# Patient Record
Sex: Male | Born: 1985 | Race: White | Hispanic: No | Marital: Single | Smoking: Former smoker
Health system: Southern US, Community
[De-identification: ages and names within clinical notes are randomized; demographics above are authoritative.]

## PROBLEM LIST (undated history)

## (undated) ENCOUNTER — Emergency Department (HOSPITAL_BASED_OUTPATIENT_CLINIC_OR_DEPARTMENT_OTHER): Admission: EM | Payer: Medicare Other | Source: Home / Self Care

## (undated) DIAGNOSIS — F319 Bipolar disorder, unspecified: Secondary | ICD-10-CM

## (undated) DIAGNOSIS — K624 Stenosis of anus and rectum: Secondary | ICD-10-CM

## (undated) DIAGNOSIS — K5090 Crohn's disease, unspecified, without complications: Secondary | ICD-10-CM

## (undated) DIAGNOSIS — F419 Anxiety disorder, unspecified: Secondary | ICD-10-CM

## (undated) DIAGNOSIS — D649 Anemia, unspecified: Secondary | ICD-10-CM

## (undated) HISTORY — DX: Stenosis of anus and rectum: K62.4

## (undated) HISTORY — PX: SUBTOTAL COLECTOMY: SHX855

---
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
## 2009-04-03 ENCOUNTER — Emergency Department (HOSPITAL_COMMUNITY): Admission: EM | Admit: 2009-04-03 | Discharge: 2009-04-03 | Payer: Self-pay | Admitting: Emergency Medicine

## 2009-04-08 ENCOUNTER — Emergency Department (HOSPITAL_COMMUNITY): Admission: EM | Admit: 2009-04-08 | Discharge: 2009-04-09 | Payer: Self-pay | Admitting: Emergency Medicine

## 2009-04-14 ENCOUNTER — Emergency Department (HOSPITAL_COMMUNITY): Admission: EM | Admit: 2009-04-14 | Discharge: 2009-04-14 | Payer: Self-pay | Admitting: Emergency Medicine

## 2009-04-19 ENCOUNTER — Inpatient Hospital Stay (HOSPITAL_COMMUNITY): Admission: EM | Admit: 2009-04-19 | Discharge: 2009-04-22 | Payer: Self-pay | Admitting: Emergency Medicine

## 2009-05-11 ENCOUNTER — Emergency Department (HOSPITAL_COMMUNITY): Admission: EM | Admit: 2009-05-11 | Discharge: 2009-05-11 | Payer: Self-pay | Admitting: Emergency Medicine

## 2010-05-13 LAB — COMPREHENSIVE METABOLIC PANEL
ALT: 16 U/L (ref 0–53)
ALT: 28 U/L (ref 0–53)
Alkaline Phosphatase: 124 U/L — ABNORMAL HIGH (ref 39–117)
BUN: 5 mg/dL — ABNORMAL LOW (ref 6–23)
BUN: 9 mg/dL (ref 6–23)
CO2: 26 mEq/L (ref 19–32)
Calcium: 8.7 mg/dL (ref 8.4–10.5)
Calcium: 9.1 mg/dL (ref 8.4–10.5)
Creatinine, Ser: 0.81 mg/dL (ref 0.4–1.5)
GFR calc non Af Amer: >60 mL/min (ref >60)
GFR calc non Af Amer: >60 mL/min (ref >60)
Glucose, Bld: 90 mg/dL (ref 70–99)
Sodium: 136 mEq/L (ref 135–145)
Sodium: 141 mEq/L (ref 135–145)
Total Bilirubin: 2.6 mg/dL — ABNORMAL HIGH (ref 0.3–1.2)

## 2010-05-13 LAB — DIFFERENTIAL
Basophils Absolute: 0.1 10*3/uL (ref 0.0–0.1)
Basophils Relative: 1 % (ref 0–1)
Basophils Relative: 1 % (ref 0–1)
Eosinophils Absolute: 0.1 10*3/uL (ref 0.0–0.7)
Eosinophils Absolute: 0.2 10*3/uL (ref 0.0–0.7)
Eosinophils Relative: 4 % (ref 0–5)
Lymphocytes Relative: 11 % — ABNORMAL LOW (ref 12–46)
Lymphocytes Relative: 17 % (ref 12–46)
Lymphocytes Relative: 5 % — ABNORMAL LOW (ref 12–46)
Lymphs Abs: 0.4 10*3/uL — ABNORMAL LOW (ref 0.7–4.0)
Lymphs Abs: 0.8 10*3/uL (ref 0.7–4.0)
Lymphs Abs: 1.2 10*3/uL (ref 0.7–4.0)
Monocytes Absolute: 0.4 10*3/uL (ref 0.1–1.0)
Monocytes Absolute: 0.6 10*3/uL (ref 0.1–1.0)
Monocytes Relative: 9 % (ref 3–12)
Monocytes Relative: 9 % (ref 3–12)
Neutro Abs: 3 10*3/uL (ref 1.7–7.7)
Neutrophils Relative %: 69 % (ref 43–77)
Neutrophils Relative %: 70 % (ref 43–77)

## 2010-05-13 LAB — CBC
HCT: 38 % — ABNORMAL LOW (ref 39.0–52.0)
HCT: 40.7 % (ref 39.0–52.0)
Hemoglobin: 12.2 g/dL — ABNORMAL LOW (ref 13.0–17.0)
Hemoglobin: 13.6 g/dL (ref 13.0–17.0)
MCHC: 32.4 g/dL (ref 30.0–36.0)
MCHC: 33.6 g/dL (ref 30.0–36.0)
MCV: 75.1 fL — ABNORMAL LOW (ref 78.0–100.0)
MCV: 75.8 fL — ABNORMAL LOW (ref 78.0–100.0)
MCV: 76.4 fL — ABNORMAL LOW (ref 78.0–100.0)
Platelets: 217 10*3/uL (ref 150–400)
Platelets: 286 10*3/uL (ref 150–400)
RBC: 4.76 MIL/uL (ref 4.22–5.81)
RBC: 5.01 MIL/uL (ref 4.22–5.81)
RBC: 5.29 MIL/uL (ref 4.22–5.81)
RDW: 14.8 % (ref 11.5–15.5)
RDW: 15.4 % (ref 11.5–15.5)
WBC: 4.9 10*3/uL (ref 4.0–10.5)

## 2010-05-13 LAB — BASIC METABOLIC PANEL
BUN: 6 mg/dL (ref 6–23)
CO2: 23 mEq/L (ref 19–32)
Creatinine, Ser: 0.79 mg/dL (ref 0.4–1.5)
GFR calc Af Amer: >60 mL/min (ref >60)
GFR calc non Af Amer: >60 mL/min (ref >60)
Glucose, Bld: 108 mg/dL — ABNORMAL HIGH (ref 70–99)
Sodium: 138 mEq/L (ref 135–145)

## 2010-05-13 LAB — URINALYSIS, ROUTINE W REFLEX MICROSCOPIC
Glucose, UA: NEGATIVE mg/dL
Hgb urine dipstick: NEGATIVE
Ketones, ur: NEGATIVE mg/dL
Nitrite: NEGATIVE
Protein, ur: NEGATIVE mg/dL
Protein, ur: NEGATIVE mg/dL
Protein, ur: NEGATIVE mg/dL
Specific Gravity, Urine: 1.014 (ref 1.005–1.030)
Specific Gravity, Urine: 1.022 (ref 1.005–1.030)
pH: 6 (ref 5.0–8.0)
pH: 6 (ref 5.0–8.0)

## 2010-05-13 LAB — URINE CULTURE
Colony Count: NO GROWTH
Culture: NO GROWTH

## 2010-05-13 LAB — POCT I-STAT, CHEM 8
Calcium, Ion: 1.22 mmol/L (ref 1.12–1.32)
Creatinine, Ser: 0.6 mg/dL (ref 0.4–1.5)
HCT: 37 % — ABNORMAL LOW (ref 39.0–52.0)
Hemoglobin: 12.6 g/dL — ABNORMAL LOW (ref 13.0–17.0)
Potassium: 3.5 mEq/L (ref 3.5–5.1)
Sodium: 144 mEq/L (ref 135–145)

## 2010-05-14 LAB — HEMOCCULT GUIAC POC 1CARD (OFFICE): Fecal Occult Bld: POSITIVE

## 2010-05-14 LAB — CBC
Hemoglobin: 14.3 g/dL (ref 13.0–17.0)
MCHC: 33.5 g/dL (ref 30.0–36.0)
Platelets: 306 10*3/uL (ref 150–400)
RDW: 14.7 % (ref 11.5–15.5)

## 2010-05-14 LAB — DIFFERENTIAL
Eosinophils Absolute: 0.2 10*3/uL (ref 0.0–0.7)
Lymphocytes Relative: 13 % (ref 12–46)
Neutro Abs: 6.2 10*3/uL (ref 1.7–7.7)

## 2010-05-14 LAB — BASIC METABOLIC PANEL
BUN: 10 mg/dL (ref 6–23)
CO2: 22 mEq/L (ref 19–32)
Chloride: 105 mEq/L (ref 96–112)
Creatinine, Ser: 0.79 mg/dL (ref 0.4–1.5)
Glucose, Bld: 111 mg/dL — ABNORMAL HIGH (ref 70–99)

## 2010-05-17 LAB — CBC
Hemoglobin: 11 g/dL — ABNORMAL LOW (ref 13.0–17.0)
MCHC: 32.9 g/dL (ref 30.0–36.0)
MCHC: 33.8 g/dL (ref 30.0–36.0)
Platelets: 206 10*3/uL (ref 150–400)
RBC: 4.38 MIL/uL (ref 4.22–5.81)
RDW: 14.8 % (ref 11.5–15.5)
RDW: 15.1 % (ref 11.5–15.5)

## 2010-05-17 LAB — BASIC METABOLIC PANEL
CO2: 25 mEq/L (ref 19–32)
Creatinine, Ser: 0.78 mg/dL (ref 0.4–1.5)
GFR calc Af Amer: >60 mL/min (ref >60)
Sodium: 140 mEq/L (ref 135–145)

## 2010-05-17 LAB — COMPREHENSIVE METABOLIC PANEL
ALT: 11 U/L (ref 0–53)
AST: 12 U/L (ref 0–37)
Alkaline Phosphatase: 58 U/L (ref 39–117)
CO2: 23 mEq/L (ref 19–32)
Calcium: 7.7 mg/dL — ABNORMAL LOW (ref 8.4–10.5)
GFR calc Af Amer: >60 mL/min (ref >60)
Potassium: 3.8 mEq/L (ref 3.5–5.1)
Sodium: 140 mEq/L (ref 135–145)
Total Protein: 5 g/dL — ABNORMAL LOW (ref 6.0–8.3)

## 2014-10-25 ENCOUNTER — Encounter (HOSPITAL_COMMUNITY): Payer: Self-pay | Admitting: Nurse Practitioner

## 2014-10-25 ENCOUNTER — Emergency Department (HOSPITAL_COMMUNITY): Payer: Medicare Other

## 2014-10-25 ENCOUNTER — Emergency Department (HOSPITAL_COMMUNITY)
Admission: EM | Admit: 2014-10-25 | Discharge: 2014-10-25 | Disposition: A | Discharge status: Home / Self Care | Payer: Medicare Other | Source: Home / Self Care | Attending: Emergency Medicine | Admitting: Emergency Medicine

## 2014-10-25 DIAGNOSIS — Z888 Allergy status to other drugs, medicaments and biological substances status: Secondary | ICD-10-CM | POA: Diagnosis not present

## 2014-10-25 DIAGNOSIS — D500 Iron deficiency anemia secondary to blood loss (chronic): Secondary | ICD-10-CM | POA: Diagnosis not present

## 2014-10-25 DIAGNOSIS — Z7952 Long term (current) use of systemic steroids: Secondary | ICD-10-CM

## 2014-10-25 DIAGNOSIS — K633 Ulcer of intestine: Secondary | ICD-10-CM | POA: Diagnosis not present

## 2014-10-25 DIAGNOSIS — F17210 Nicotine dependence, cigarettes, uncomplicated: Secondary | ICD-10-CM | POA: Diagnosis present

## 2014-10-25 DIAGNOSIS — Z8659 Personal history of other mental and behavioral disorders: Secondary | ICD-10-CM | POA: Insufficient documentation

## 2014-10-25 DIAGNOSIS — K50111 Crohn's disease of large intestine with rectal bleeding: Secondary | ICD-10-CM | POA: Diagnosis present

## 2014-10-25 DIAGNOSIS — E876 Hypokalemia: Secondary | ICD-10-CM | POA: Diagnosis present

## 2014-10-25 DIAGNOSIS — Z8249 Family history of ischemic heart disease and other diseases of the circulatory system: Secondary | ICD-10-CM

## 2014-10-25 DIAGNOSIS — T380X5A Adverse effect of glucocorticoids and synthetic analogues, initial encounter: Secondary | ICD-10-CM | POA: Diagnosis not present

## 2014-10-25 DIAGNOSIS — E860 Dehydration: Secondary | ICD-10-CM | POA: Diagnosis present

## 2014-10-25 DIAGNOSIS — F419 Anxiety disorder, unspecified: Secondary | ICD-10-CM | POA: Diagnosis present

## 2014-10-25 DIAGNOSIS — K50811 Crohn's disease of both small and large intestine with rectal bleeding: Secondary | ICD-10-CM | POA: Diagnosis not present

## 2014-10-25 DIAGNOSIS — K624 Stenosis of anus and rectum: Secondary | ICD-10-CM | POA: Diagnosis present

## 2014-10-25 DIAGNOSIS — R739 Hyperglycemia, unspecified: Secondary | ICD-10-CM | POA: Diagnosis present

## 2014-10-25 DIAGNOSIS — K5090 Crohn's disease, unspecified, without complications: Secondary | ICD-10-CM

## 2014-10-25 DIAGNOSIS — K50919 Crohn's disease, unspecified, with unspecified complications: Secondary | ICD-10-CM

## 2014-10-25 DIAGNOSIS — F319 Bipolar disorder, unspecified: Secondary | ICD-10-CM | POA: Diagnosis not present

## 2014-10-25 DIAGNOSIS — R1030 Lower abdominal pain, unspecified: Secondary | ICD-10-CM | POA: Diagnosis present

## 2014-10-25 HISTORY — DX: Crohn's disease, unspecified, without complications: K50.90

## 2014-10-25 HISTORY — DX: Bipolar disorder, unspecified: F31.9

## 2014-10-25 HISTORY — DX: Anxiety disorder, unspecified: F41.9

## 2014-10-25 LAB — URINALYSIS, ROUTINE W REFLEX MICROSCOPIC
BILIRUBIN URINE: NEGATIVE
Glucose, UA: NEGATIVE mg/dL
Hgb urine dipstick: NEGATIVE
KETONES UR: 15 mg/dL — AB
LEUKOCYTES UA: NEGATIVE
NITRITE: NEGATIVE
PROTEIN: 30 mg/dL — AB
Specific Gravity, Urine: 1.037 — ABNORMAL HIGH (ref 1.005–1.030)
UROBILINOGEN UA: 0.2 mg/dL (ref 0.0–1.0)
pH: 6.5 (ref 5.0–8.0)

## 2014-10-25 LAB — COMPREHENSIVE METABOLIC PANEL
ALK PHOS: 60 U/L (ref 38–126)
ALT: 25 U/L (ref 17–63)
AST: 21 U/L (ref 15–41)
Albumin: 3.5 g/dL (ref 3.5–5.0)
Anion gap: 7 (ref 5–15)
BILIRUBIN TOTAL: 0.7 mg/dL (ref 0.3–1.2)
BUN: 12 mg/dL (ref 6–20)
CALCIUM: 8.8 mg/dL — AB (ref 8.9–10.3)
CHLORIDE: 111 mmol/L (ref 101–111)
CO2: 25 mmol/L (ref 22–32)
CREATININE: 1.05 mg/dL (ref 0.61–1.24)
Glucose, Bld: 97 mg/dL (ref 65–99)
Potassium: 3.1 mmol/L — ABNORMAL LOW (ref 3.5–5.1)
Sodium: 143 mmol/L (ref 135–145)
TOTAL PROTEIN: 6 g/dL — AB (ref 6.5–8.1)

## 2014-10-25 LAB — CBC
HCT: 38.4 % — ABNORMAL LOW (ref 39.0–52.0)
Hemoglobin: 11.9 g/dL — ABNORMAL LOW (ref 13.0–17.0)
MCH: 23.9 pg — ABNORMAL LOW (ref 26.0–34.0)
MCHC: 31 g/dL (ref 30.0–36.0)
MCV: 77.3 fL — ABNORMAL LOW (ref 78.0–100.0)
PLATELETS: 279 10*3/uL (ref 150–400)
RBC: 4.97 MIL/uL (ref 4.22–5.81)
RDW: 14.9 % (ref 11.5–15.5)
WBC: 12 10*3/uL — AB (ref 4.0–10.5)

## 2014-10-25 LAB — SEDIMENTATION RATE: Sed Rate: 16 mm/hr (ref 0–16)

## 2014-10-25 LAB — URINE MICROSCOPIC-ADD ON

## 2014-10-25 MED ORDER — ONDANSETRON 4 MG PO TBDP: 4.0000 mg | ORAL_TABLET | Freq: Three times a day (TID) | ORAL | Status: DC | PRN

## 2014-10-25 MED ORDER — PREDNISONE 20 MG PO TABS: 40.0000 mg | ORAL_TABLET | Freq: Two times a day (BID) | ORAL | Status: DC

## 2014-10-25 MED ORDER — IOHEXOL 300 MG/ML  SOLN
25.0000 mL | Freq: Once | INTRAMUSCULAR | Status: AC | PRN
  Administered 2014-10-25: 100 mL via ORAL

## 2014-10-25 MED ORDER — OXYCODONE-ACETAMINOPHEN 5-325 MG PO TABS: 1.0000 | ORAL_TABLET | ORAL | Status: DC | PRN

## 2014-10-25 MED ORDER — SODIUM CHLORIDE 0.9 % IV BOLUS (SEPSIS)
1000.0000 mL | Freq: Once | INTRAVENOUS | Status: AC
  Administered 2014-10-25: 1000 mL via INTRAVENOUS

## 2014-10-25 MED ORDER — ONDANSETRON HCL 4 MG/2ML IJ SOLN
4.0000 mg | Freq: Once | INTRAMUSCULAR | Status: AC
  Administered 2014-10-25: 4 mg via INTRAVENOUS
  Filled 2014-10-25: qty 2

## 2014-10-25 MED ORDER — METHYLPREDNISOLONE SODIUM SUCC 125 MG IJ SOLR
125.0000 mg | Freq: Once | INTRAMUSCULAR | Status: AC
  Administered 2014-10-25: 125 mg via INTRAVENOUS
  Filled 2014-10-25: qty 2

## 2014-10-25 MED ORDER — HYDROMORPHONE HCL 1 MG/ML IJ SOLN
0.5000 mg | Freq: Once | INTRAMUSCULAR | Status: AC
  Administered 2014-10-25: 0.5 mg via INTRAVENOUS
  Filled 2014-10-25: qty 1

## 2014-10-25 MED ORDER — HYDROMORPHONE HCL 1 MG/ML IJ SOLN
1.0000 mg | Freq: Once | INTRAMUSCULAR | Status: AC
  Administered 2014-10-25: 1 mg via INTRAVENOUS
  Filled 2014-10-25: qty 1

## 2014-10-25 NOTE — Discharge Instructions (Signed)
Please take 40mg  Prednisone twice a day for 5 days and then resume taking your usual prescription of 40mg  Prednisone daily.  Please take Zofran and Percocet as prescribed for nausea and pain control. Please follow up with your GI physician as soon as you return to [place]. Please return to the Emergency Department if symptoms worsen or new onset of fever, vomiting, increased blood in stool, urinary symptoms.

## 2014-10-25 NOTE — ED Notes (Signed)
Pt reports onset LLQ pain, nausea, diarrhea yesterday. He has a hx of chrons, is from out of state and unable to contact physician for instructions at this time. His physician just increased his prednisone dosage last week.

## 2014-10-25 NOTE — ED Provider Notes (Signed)
CSN: [id number]     Arrival date & time 10/25/14  1128 History   First MD Initiated Contact with Patient 10/25/14 1131     Chief Complaint  Patient presents with  . Abdominal Pain     (Consider location/radiation/quality/duration/timing/severity/associated sxs/prior Treatment) HPI Comments: Pt is a 29 yo male with history of Crohn's disease (last flare 1.5 months ago) who presents to the ED with complaint of LLQ pain, onset 2 days. Pt reports he is from [place] and is visiting for the week. He notes he started having LLQ pain that worsens with BM. Endorses nausea, diarrhea. He endorses small amount of blood in his stool but notes that he usually has blood in his stool and the amount of blood is typical/unchanged. He also reports increased BMs (once every hour). Denies fever, HA, cough, SOB, CP, vomiting, urinary sxs, constipation. Pt notes his GI physician increased his prednisone dose to  last last month during his routine visit. He notes at the time his imaging revealed increased inflammation. He notes he has had a decreased appetite from the nausea but has tried to eat low fiber diet since the flare started. Pt states he had abdominal surgery appx 1 year ago when he had most of his colon removed (no colostomy bag) and still has a healing mid-line abdominal incision which he notes is unchanged and without any drainage/erythema.  Patient is a 29 y.o. male presenting with abdominal pain.  Abdominal Pain Associated symptoms: diarrhea and nausea     Past Medical History  Diagnosis Date  . Anxiety   . Bipolar depression   . Crohn disease    Past Surgical History  Procedure Laterality Date  . Abdominal surgery    . Colon surgery     History reviewed. No pertinent family history. Social History  Substance Use Topics  . Smoking status: Never Smoker   . Smokeless tobacco: None  . Alcohol Use: Yes    Review of Systems  Gastrointestinal: Positive for nausea, abdominal pain, diarrhea and  blood in stool.  All other systems reviewed and are negative.     Allergies  Compazine; Lithium; and Remicade  Home Medications   Prior to Admission medications   Not on File   BP 120/82 mmHg  Pulse 115  Temp(Src) 98.7 F (37.1 C) (Oral)  Resp 18  SpO2 99% Physical Exam  Constitutional: He is oriented to person, place, and time. He appears well-developed and well-nourished.  HENT:  Head: Normocephalic and atraumatic.  Mouth/Throat: Oropharynx is clear and moist. No oropharyngeal exudate.  Eyes: Conjunctivae and EOM are normal. Pupils are equal, round, and reactive to light. Right eye exhibits no discharge. Left eye exhibits no discharge. No scleral icterus.  Neck: Normal range of motion. Neck supple.  Cardiovascular: Normal rate, regular rhythm, normal heart sounds and intact distal pulses.   Pulmonary/Chest: Effort normal and breath sounds normal. He has no wheezes. He has no rales. He exhibits no tenderness.  Abdominal: Soft. Bowel sounds are normal. He exhibits no distension and no mass. There is tenderness. There is no rigidity, no rebound and no guarding.  TTP in LLQ.   Musculoskeletal: He exhibits no edema.  Lymphadenopathy:    He has no cervical adenopathy.  Neurological: He is alert and oriented to person, place, and time.  Skin: Skin is warm and dry.  6cm healing mid-line abdominal incision, no drainage, no erythema, no warmth.   Nursing note and vitals reviewed.   ED Course  Procedures (including critical  care time) Labs Review Labs Reviewed  CBC - Abnormal; Notable for the following:    WBC 12.0 (*)    Hemoglobin 11.9 (*)    HCT 38.4 (*)    MCV 77.3 (*)    MCH 23.9 (*)    All other components within normal limits  COMPREHENSIVE METABOLIC PANEL  URINALYSIS, ROUTINE W REFLEX MICROSCOPIC (NOT AT [facility])    Imaging Review No results found. I have personally reviewed and evaluated these images and lab results as part of my medical decision-making.  Filed  Vitals:   10/25/14 1525  BP: 109/68  Pulse: 85  Temp:   Resp:    Meds given in ED:  Medications  sodium chloride 0.9 % bolus 1,000 mL (0 mLs Intravenous Stopped 10/25/14 1452)  ondansetron (ZOFRAN) injection 4 mg (4 mg Intravenous Given 10/25/14 1238)  HYDROmorphone (DILAUDID) injection 0.5 mg (0.5 mg Intravenous Given 10/25/14 1238)  iohexol (OMNIPAQUE) 300 MG/ML solution 25 mL (100 mLs Oral Contrast Given 10/25/14 1408)  HYDROmorphone (DILAUDID) injection 1 mg (1 mg Intravenous Given 10/25/14 1318)  methylPREDNISolone sodium succinate (SOLU-MEDROL) 125 mg/2 mL injection 125 mg (125 mg Intravenous Given 10/25/14 1524)    New Prescriptions   ONDANSETRON (ZOFRAN ODT) 4 MG DISINTEGRATING TABLET    Take 1 tablet (4 mg total) by mouth every 8 (eight) hours as needed for nausea or vomiting.   OXYCODONE-ACETAMINOPHEN (PERCOCET/ROXICET) 5-325 MG PER TABLET    Take 1 tablet by mouth every 4 (four) hours as needed for severe pain.   PREDNISONE (DELTASONE) 20 MG TABLET    Take 2 tablets (40 mg total) by mouth 2 (two) times daily.     MDM   Final diagnoses:  Crohn disease, unspecified complication    Pt presents with Crohn's flare with complaint of LLQ pain, nausea and diarrhea with unchanged blood in stool. No history of abscesses. VSS.   UA, CMP unremarkable. WBC slightly elevated. Sed rate 16. CT revealed with mild thickening near the ileocolonic anastomosis, and mild hypervascularity and inflammatory changes in the adjacent mesentery. Pt given 125mg  solumedrol.    Dr. Fayrene Fearing had a long discussion with patient regarding plan for admission vs. D/c. Agreed with plan to d/c pt home with increasing prednisone dose and giving rx for zofran and percocet along with following up with his GI physician in [place] once be returns home next week.   Evaluation does not show pathology requring ongoing emergent intervention or admission. Pt is hemodynamically stable and mentating appropriately. Discussed  findings/results and plan with patient/guardian, who agrees with plan. All questions answered. Return precautions discussed and outpatient follow up given.      Satira Sark Norris, [place] 10/25/14 1945  Rolland Porter, MD 10/26/14 [phone]

## 2014-10-25 NOTE — ED Provider Notes (Signed)
Pt seen and evaluated.  D/W PA Nadeau.  Long history of Crohn's disease. Had a colectomy with reanastomosis. Increased on his Zyvox was 6 weeks ago from 20-40 per day. Recent flare with increasing symptoms over last 2-3 days. No fever. Some pain. States he always has a small amount of blood in his stool has slightly more.  CT shows short segment inflammation. He was given IV antiemetics and pain medications. Was given IV stairways as well. States he is feeling better. Had a long discussion with him. Told him that if he felt he would not do well at home that this would easily justify admission to the hospital. He states that he is from out of town he has a long history of Crohn's and feels comfortable with sterilely burst and taper back to his daily dose. He states he is staying close by and could return if anything becomes worse at home.  Rolland Porter, MD 10/25/14 [phone]

## 2014-10-25 NOTE — ED Notes (Signed)
Pt stable, ambulatory, states understanding of discharge instructions 

## 2014-10-26 ENCOUNTER — Inpatient Hospital Stay (HOSPITAL_COMMUNITY)
Admission: EM | Admit: 2014-10-26 | Discharge: 2014-11-02 | DRG: 386 | Disposition: A | Discharge status: Home / Self Care | Payer: Medicare Other | Attending: Internal Medicine | Admitting: Internal Medicine

## 2014-10-26 ENCOUNTER — Encounter (HOSPITAL_COMMUNITY): Payer: Self-pay | Admitting: Emergency Medicine

## 2014-10-26 DIAGNOSIS — F17210 Nicotine dependence, cigarettes, uncomplicated: Secondary | ICD-10-CM | POA: Diagnosis not present

## 2014-10-26 DIAGNOSIS — D509 Iron deficiency anemia, unspecified: Secondary | ICD-10-CM | POA: Diagnosis present

## 2014-10-26 DIAGNOSIS — E860 Dehydration: Secondary | ICD-10-CM | POA: Diagnosis present

## 2014-10-26 DIAGNOSIS — K50919 Crohn's disease, unspecified, with unspecified complications: Secondary | ICD-10-CM | POA: Diagnosis not present

## 2014-10-26 DIAGNOSIS — K50111 Crohn's disease of large intestine with rectal bleeding: Secondary | ICD-10-CM | POA: Diagnosis not present

## 2014-10-26 DIAGNOSIS — K5010 Crohn's disease of large intestine without complications: Secondary | ICD-10-CM | POA: Diagnosis present

## 2014-10-26 DIAGNOSIS — K633 Ulcer of intestine: Secondary | ICD-10-CM | POA: Diagnosis not present

## 2014-10-26 DIAGNOSIS — E876 Hypokalemia: Secondary | ICD-10-CM | POA: Diagnosis present

## 2014-10-26 DIAGNOSIS — T380X5A Adverse effect of glucocorticoids and synthetic analogues, initial encounter: Secondary | ICD-10-CM | POA: Diagnosis not present

## 2014-10-26 DIAGNOSIS — K624 Stenosis of anus and rectum: Secondary | ICD-10-CM | POA: Diagnosis not present

## 2014-10-26 DIAGNOSIS — Z7952 Long term (current) use of systemic steroids: Secondary | ICD-10-CM | POA: Diagnosis not present

## 2014-10-26 DIAGNOSIS — R112 Nausea with vomiting, unspecified: Secondary | ICD-10-CM | POA: Diagnosis present

## 2014-10-26 DIAGNOSIS — F319 Bipolar disorder, unspecified: Secondary | ICD-10-CM | POA: Diagnosis present

## 2014-10-26 DIAGNOSIS — K50811 Crohn's disease of both small and large intestine with rectal bleeding: Principal | ICD-10-CM

## 2014-10-26 DIAGNOSIS — R739 Hyperglycemia, unspecified: Secondary | ICD-10-CM | POA: Diagnosis present

## 2014-10-26 DIAGNOSIS — R1030 Lower abdominal pain, unspecified: Secondary | ICD-10-CM | POA: Diagnosis present

## 2014-10-26 DIAGNOSIS — F419 Anxiety disorder, unspecified: Secondary | ICD-10-CM | POA: Diagnosis not present

## 2014-10-26 DIAGNOSIS — Z8249 Family history of ischemic heart disease and other diseases of the circulatory system: Secondary | ICD-10-CM | POA: Diagnosis not present

## 2014-10-26 DIAGNOSIS — K5090 Crohn's disease, unspecified, without complications: Secondary | ICD-10-CM | POA: Diagnosis present

## 2014-10-26 DIAGNOSIS — K50119 Crohn's disease of large intestine with unspecified complications: Secondary | ICD-10-CM

## 2014-10-26 DIAGNOSIS — D500 Iron deficiency anemia secondary to blood loss (chronic): Secondary | ICD-10-CM | POA: Diagnosis not present

## 2014-10-26 DIAGNOSIS — Z888 Allergy status to other drugs, medicaments and biological substances status: Secondary | ICD-10-CM | POA: Diagnosis not present

## 2014-10-26 HISTORY — DX: Anemia, unspecified: D64.9

## 2014-10-26 LAB — BASIC METABOLIC PANEL
Anion gap: 13 (ref 5–15)
BUN: 13 mg/dL (ref 6–20)
CO2: 21 mmol/L — AB (ref 22–32)
Calcium: 9.2 mg/dL (ref 8.9–10.3)
Chloride: 110 mmol/L (ref 101–111)
Creatinine, Ser: 0.93 mg/dL (ref 0.61–1.24)
GFR calc Af Amer: >60 mL/min (ref >60)
GLUCOSE: 122 mg/dL — AB (ref 65–99)
POTASSIUM: 3.1 mmol/L — AB (ref 3.5–5.1)
Sodium: 144 mmol/L (ref 135–145)

## 2014-10-26 LAB — C DIFFICILE QUICK SCREEN W PCR REFLEX
C DIFFICILE (CDIFF) INTERP: NEGATIVE
C DIFFICILE (CDIFF) TOXIN: NEGATIVE
C DIFFICLE (CDIFF) ANTIGEN: NEGATIVE

## 2014-10-26 LAB — I-STAT CG4 LACTIC ACID, ED: Lactic Acid, Venous: 4.6 mmol/L (ref 0.5–2.0)

## 2014-10-26 LAB — CBC WITH DIFFERENTIAL/PLATELET
Basophils Absolute: 0 10*3/uL (ref 0.0–0.1)
Basophils Relative: 0 % (ref 0–1)
EOS PCT: 0 % (ref 0–5)
Eosinophils Absolute: 0 10*3/uL (ref 0.0–0.7)
HCT: 35.5 % — ABNORMAL LOW (ref 39.0–52.0)
Hemoglobin: 11 g/dL — ABNORMAL LOW (ref 13.0–17.0)
LYMPHS ABS: 0.7 10*3/uL (ref 0.7–4.0)
LYMPHS PCT: 4 % — AB (ref 12–46)
MCH: 24.1 pg — AB (ref 26.0–34.0)
MCHC: 31 g/dL (ref 30.0–36.0)
MCV: 77.7 fL — AB (ref 78.0–100.0)
MONO ABS: 0.4 10*3/uL (ref 0.1–1.0)
Monocytes Relative: 3 % (ref 3–12)
Neutro Abs: 15 10*3/uL — ABNORMAL HIGH (ref 1.7–7.7)
Neutrophils Relative %: 93 % — ABNORMAL HIGH (ref 43–77)
PLATELETS: 253 10*3/uL (ref 150–400)
RBC: 4.57 MIL/uL (ref 4.22–5.81)
RDW: 14.7 % (ref 11.5–15.5)
WBC: 16.1 10*3/uL — ABNORMAL HIGH (ref 4.0–10.5)

## 2014-10-26 LAB — LACTIC ACID, PLASMA: Lactic Acid, Venous: 2.1 mmol/L (ref 0.5–2.0)

## 2014-10-26 MED ORDER — METRONIDAZOLE IN NACL 5-0.79 MG/ML-% IV SOLN
500.0000 mg | Freq: Three times a day (TID) | INTRAVENOUS | Status: DC
  Administered 2014-10-26 – 2014-11-02 (×21): 500 mg via INTRAVENOUS
  Filled 2014-10-26 (×23): qty 100

## 2014-10-26 MED ORDER — PROMETHAZINE HCL 25 MG/ML IJ SOLN
25.0000 mg | Freq: Four times a day (QID) | INTRAMUSCULAR | Status: DC | PRN
  Administered 2014-10-31: 25 mg via INTRAVENOUS
  Filled 2014-10-26: qty 1

## 2014-10-26 MED ORDER — POTASSIUM CHLORIDE 10 MEQ/100ML IV SOLN
10.0000 meq | INTRAVENOUS | Status: DC
  Administered 2014-10-26: 10 meq via INTRAVENOUS
  Filled 2014-10-26 (×3): qty 100

## 2014-10-26 MED ORDER — METHYLPREDNISOLONE SODIUM SUCC 40 MG IJ SOLR
40.0000 mg | Freq: Three times a day (TID) | INTRAMUSCULAR | Status: DC
  Administered 2014-10-26 – 2014-10-27 (×2): 40 mg via INTRAVENOUS
  Filled 2014-10-26 (×2): qty 1

## 2014-10-26 MED ORDER — SODIUM CHLORIDE 0.9 % IV BOLUS (SEPSIS)
500.0000 mL | Freq: Once | INTRAVENOUS | Status: AC
  Administered 2014-10-26: 500 mL via INTRAVENOUS

## 2014-10-26 MED ORDER — CIPROFLOXACIN IN D5W 400 MG/200ML IV SOLN: 400.0000 mg | Freq: Two times a day (BID) | INTRAVENOUS | Status: DC

## 2014-10-26 MED ORDER — LOPERAMIDE HCL 1 MG/5ML PO LIQD
1.0000 mg | ORAL | Status: DC | PRN
  Administered 2014-11-01: 1 mg via ORAL
  Filled 2014-10-26 (×3): qty 5

## 2014-10-26 MED ORDER — SODIUM CHLORIDE 0.9 % IV BOLUS (SEPSIS)
2000.0000 mL | Freq: Once | INTRAVENOUS | Status: AC
  Administered 2014-10-26: 2000 mL via INTRAVENOUS

## 2014-10-26 MED ORDER — ONDANSETRON HCL 4 MG/2ML IJ SOLN: 4.0000 mg | Freq: Four times a day (QID) | INTRAMUSCULAR | Status: DC | PRN

## 2014-10-26 MED ORDER — SODIUM CHLORIDE 0.9 % IV SOLN: Freq: Once | INTRAVENOUS | Status: DC

## 2014-10-26 MED ORDER — KETOROLAC TROMETHAMINE 15 MG/ML IJ SOLN
15.0000 mg | Freq: Four times a day (QID) | INTRAMUSCULAR | Status: DC
  Administered 2014-10-26 – 2014-10-27 (×3): 15 mg via INTRAVENOUS
  Filled 2014-10-26 (×3): qty 1

## 2014-10-26 MED ORDER — ONDANSETRON HCL 4 MG/2ML IJ SOLN
4.0000 mg | Freq: Once | INTRAMUSCULAR | Status: AC
  Administered 2014-10-26: 4 mg via INTRAVENOUS
  Filled 2014-10-26: qty 2

## 2014-10-26 MED ORDER — FAMOTIDINE IN NACL 20-0.9 MG/50ML-% IV SOLN
20.0000 mg | Freq: Two times a day (BID) | INTRAVENOUS | Status: DC
  Administered 2014-10-26 – 2014-10-27 (×2): 20 mg via INTRAVENOUS
  Filled 2014-10-26 (×3): qty 50

## 2014-10-26 MED ORDER — METHYLPREDNISOLONE SODIUM SUCC 40 MG IJ SOLR: 40.0000 mg | Freq: Three times a day (TID) | INTRAMUSCULAR | Status: DC

## 2014-10-26 MED ORDER — NICOTINE 21 MG/24HR TD PT24
21.0000 mg | MEDICATED_PATCH | TRANSDERMAL | Status: DC
  Administered 2014-10-26 – 2014-11-01 (×7): 21 mg via TRANSDERMAL
  Filled 2014-10-26 (×7): qty 1

## 2014-10-26 MED ORDER — AZATHIOPRINE 50 MG PO TABS
175.0000 mg | ORAL_TABLET | Freq: Every day | ORAL | Status: DC
  Administered 2014-10-26 – 2014-11-02 (×8): 175 mg via ORAL
  Filled 2014-10-26 (×9): qty 4

## 2014-10-26 MED ORDER — METHYLPREDNISOLONE SODIUM SUCC 40 MG IJ SOLR
40.0000 mg | Freq: Once | INTRAMUSCULAR | Status: AC
  Administered 2014-10-26: 40 mg via INTRAVENOUS
  Filled 2014-10-26: qty 1

## 2014-10-26 MED ORDER — HYDROMORPHONE HCL 1 MG/ML IJ SOLN
1.0000 mg | INTRAMUSCULAR | Status: DC | PRN
  Administered 2014-10-26 – 2014-10-27 (×6): 2 mg via INTRAVENOUS
  Administered 2014-10-27: 1 mg via INTRAVENOUS
  Administered 2014-10-27 (×2): 2 mg via INTRAVENOUS
  Administered 2014-10-28: 1 mg via INTRAVENOUS
  Administered 2014-10-28 – 2014-10-29 (×11): 2 mg via INTRAVENOUS
  Administered 2014-10-29: 1 mg via INTRAVENOUS
  Administered 2014-10-29 – 2014-10-30 (×3): 2 mg via INTRAVENOUS
  Administered 2014-10-30: 1 mg via INTRAVENOUS
  Administered 2014-10-30 – 2014-11-01 (×15): 2 mg via INTRAVENOUS
  Filled 2014-10-26 (×18): qty 2
  Filled 2014-10-26: qty 1
  Filled 2014-10-26 (×6): qty 2
  Filled 2014-10-26: qty 1
  Filled 2014-10-26 (×11): qty 2
  Filled 2014-10-26: qty 1
  Filled 2014-10-26 (×3): qty 2

## 2014-10-26 MED ORDER — GABAPENTIN 300 MG PO CAPS
900.0000 mg | ORAL_CAPSULE | Freq: Three times a day (TID) | ORAL | Status: DC
  Administered 2014-10-26 – 2014-11-02 (×20): 900 mg via ORAL
  Filled 2014-10-26 (×20): qty 3

## 2014-10-26 MED ORDER — HYDROMORPHONE HCL 1 MG/ML IJ SOLN
1.0000 mg | INTRAMUSCULAR | Status: AC | PRN
  Administered 2014-10-26 (×2): 1 mg via INTRAVENOUS
  Filled 2014-10-26 (×2): qty 1

## 2014-10-26 MED ORDER — POTASSIUM CHLORIDE IN NACL 20-0.9 MEQ/L-% IV SOLN
INTRAVENOUS | Status: DC
  Administered 2014-10-26 – 2014-10-28 (×5): via INTRAVENOUS
  Administered 2014-10-29: 75 mL/h via INTRAVENOUS
  Filled 2014-10-26 (×7): qty 1000

## 2014-10-26 MED ORDER — ENOXAPARIN SODIUM 40 MG/0.4ML ~~LOC~~ SOLN
40.0000 mg | SUBCUTANEOUS | Status: DC
  Filled 2014-10-26 (×3): qty 0.4

## 2014-10-26 MED ORDER — ALPRAZOLAM 0.5 MG PO TABS
0.5000 mg | ORAL_TABLET | Freq: Two times a day (BID) | ORAL | Status: DC
  Administered 2014-10-26 – 2014-11-02 (×15): 0.5 mg via ORAL
  Filled 2014-10-26 (×15): qty 1

## 2014-10-26 MED ORDER — HYDROMORPHONE HCL 1 MG/ML IJ SOLN
1.0000 mg | INTRAMUSCULAR | Status: DC | PRN
  Administered 2014-10-26 (×3): 1 mg via INTRAVENOUS
  Filled 2014-10-26 (×3): qty 1

## 2014-10-26 NOTE — Progress Notes (Signed)
CRITICAL VALUE ALERT  Critical value received:  Lactic acid 2.1  Date of notification:  10/26/14  Time of notification:  1354  Critical value read back:Yes.    Nurse who received alert:  Tonna Corner, RN   MD notified (1st page):  Dr. Vanessa Barbara  Time of first page:  1354  MD notified (2nd page): Dr. Vanessa Barbara  Time of second page: 1305  Responding MD:  Dr. Vanessa Barbara  Time MD responded:  [phone]

## 2014-10-26 NOTE — H&P (Signed)
[facility] History and Physical                                                                                    Andre [name], is a 29 y.o. male  MRN: [id number]   DOB - October 05, 1985  Admit Date - 10/26/2014  Outpatient Primary MD for the patient is No primary care provider on file.  Referring MD: Fayrene Fearing / ER  Consulting M.D: Rhea Belton / Gastroenterology  With History of -  Past Medical History  Diagnosis Date  . Anxiety   . Bipolar depression   . Crohn disease 1997  . Anemia     Around 2013 required PRBC transfusions. Has received parenteral iron in past. Has taken oral iron in past.      Past Surgical History  Procedure Laterality Date  . Subtotal colectomy      Has undergone a total of 3 separate bowel resections including terminal ileal resection and the equivalent of subtotal colectomy. Last surgery was in 2010.    in for   Chief Complaint  Patient presents with  . Abdominal Pain  . Nausea     HPI This is a 29 year old male patient with long-standing Crohn's disease since the age of 29 he was also undergone multiple colonic and small bowel procedures resulting with an endpoint of basically a subtotal colectomy who presents to the ER for the second time in the past 36 hours with symptoms consistent with a Crohn's flare. He also has a past medical history of bipolar disorder and anxiety on chronic medications. He has multiple drug allergies including to Remicade and and Humira. Patient has traveled to [facility] for the holiday weekend to visit a friend. He reports this past Thursday that he began having vague symptoms similar to prior Crohn's flares and by Friday morning had a severe flare. He was seen in the ER Saturday 9/3 with increased frequency of stools and increasing left lower quadrant abdominal pain with nausea. CT that time revealed trace free fluid around his ileo-colonic anastomosis well as a few loops of dilated small bowel. His symptoms improved after  anti-medics and pain medication. He was offered admission but felt as if he could manage with outpatient oral antibiotics he was given a dose of IV site Medrol in the ER and discharged from the ER with prednisone 40 mg twice a day. He returns to the ER today for worsening abdominal pain nausea and vomiting and diarrhea. He again reports averaging one stool in our which at baseline he typically only has 3 stools per day. He always has a trace amount of blood in his stools he describes as "spotting". He thinks the pain medication may be contributing decided his nausea. Chronically patient has not experienced any weight loss. His physicians are in the [facility] area. For most flares he typically responds to IV steroids with appropriate antibiotic therapy; he reports typically responds well to Cipro and Flagyl.  In the ER patient had a temperature of 98.9, BP was 106/93, pulse was 72 and regular respirations 16 with room air saturations are 100%. Laboratory data revealed persistent hypokalemia with potassium 3.1, CO2 slightly decreased at 21,  anion gap normal at 13, glucose slightly elevated at 122, renal function normal, white count had increased to 16,100, hemoglobin 11, MCV 77.1 with normal platelets, neutrophils 93%. Lactic acid was elevated at 4.6.   Review of Systems   In addition to the HPI above,  No Fever-chills, myalgias or other constitutional symptoms No Headache, changes with Vision or hearing, new weakness, tingling, numbness in any extremity, No problems swallowing food or Liquids, indigestion/reflux No Chest pain, Cough or Shortness of Breath, palpitations, orthopnea or DOE No dysuria, hematuria or flank pain No new skin rashes, lesions, masses or bruises, No new joints pains-aches No recent weight gain or loss No polyuria, polydypsia or polyphagia,  *A full 10 point Review of Systems was done, except as stated above, all other Review of Systems were negative.  Social  History Social History  Substance Use Topics  . Smoking status: Never Smoker   . Smokeless tobacco: Not on file  . Alcohol Use: Yes    Resides at: Private residence and [facility] area  Lives with: Girlfriend  Ambulatory status: Without assistive devices  Other pertinent social history: Disabled   Family History Father: Deceased-known hypertension and coronary artery disease   Prior to Admission medications   Medication Sig Start Date End Date Taking? Authorizing Provider  ALPRAZolam Prudy Feeler) 0.5 MG tablet Take 0.5 mg by mouth 2 (two) times daily.   Yes Historical Provider, MD  azaTHIOprine (IMURAN) 50 MG tablet Take 175 mg by mouth daily.   Yes Historical Provider, MD  gabapentin (NEURONTIN) 300 MG capsule Take 900 mg by mouth 3 (three) times daily.   Yes Historical Provider, MD  ondansetron (ZOFRAN ODT) 4 MG disintegrating tablet Take 1 tablet (4 mg total) by mouth every 8 (eight) hours as needed for nausea or vomiting. 10/25/14  Yes Barrett Henle, PA-C  oxyCODONE-acetaminophen (PERCOCET/ROXICET) 5-325 MG per tablet Take 1 tablet by mouth every 4 (four) hours as needed for severe pain. 10/25/14  Yes Barrett Henle, PA-C  predniSONE (DELTASONE) 20 MG tablet Take 2 tablets (40 mg total) by mouth 2 (two) times daily. 10/25/14  Yes Barrett Henle, PA-C    Allergies  Allergen Reactions  . Compazine [Prochlorperazine]   . Lithium   . Remicade [Infliximab]     Physical Exam  Vitals  Blood pressure 126/79, pulse 66, temperature 98.9 F (37.2 C), temperature source Oral, resp. rate 11, SpO2 100 %.   General:  In no acute distress, appears chronically ill  Psych:  Normal affect, Denies Suicidal or Homicidal ideations, Awake Alert, Oriented X 3. Speech and thought patterns are clear and appropriate, no apparent short term memory deficits  Neuro:   No focal neurological deficits, CN II through XII intact, Strength 5/5 all 4 extremities, Sensation  intact all 4 extremities.  ENT:  Ears and Eyes appear Normal, Conjunctivae clear, sclerae slightly yellow discolored, PER. Dry oral mucosa without erythema or exudates. Subtle moon face ease  Neck:  Supple, No lymphadenopathy appreciated  Respiratory:  Symmetrical chest wall movement, Good air movement bilaterally, CTAB. Room Air  Cardiac:  RRR, No Murmurs, no LE edema noted, no JVD, No carotid bruits, peripheral pulses palpable at 2+  Abdomen:  Positive bowel hypoactive bowel sounds, Soft, exquisitely tender left lower quadrant with guarding but no rebounding Non distended,  No masses appreciated, no obvious hepatosplenomegaly  Skin:  No Cyanosis, Normal Skin Turgor, No Skin Rash or Bruise.  Extremities: Symmetrical without obvious trauma or injury,  no effusions.  Data Review  CBC  Recent Labs Lab 10/25/14 1154 10/26/14 1032  WBC 12.0* 16.1*  HGB 11.9* 11.0*  HCT 38.4* 35.5*  PLT 279 253  MCV 77.3* 77.7*  MCH 23.9* 24.1*  MCHC 31.0 31.0  RDW 14.9 14.7  LYMPHSABS  --  0.7  MONOABS  --  0.4  EOSABS  --  0.0  BASOSABS  --  0.0    Chemistries   Recent Labs Lab 10/25/14 1154 10/26/14 1032  NA 143 144  K 3.1* 3.1*  CL 111 110  CO2 25 21*  GLUCOSE 97 122*  BUN 12 13  CREATININE 1.05 0.93  CALCIUM 8.8* 9.2  AST 21  --   ALT 25  --   ALKPHOS 60  --   BILITOT 0.7  --     CrCl cannot be calculated (Unknown ideal weight.).  No results for input(s): TSH, T4TOTAL, T3FREE, THYROIDAB in the last 72 hours.  Invalid input(s): FREET3  Coagulation profile No results for input(s): INR, PROTIME in the last 168 hours.  No results for input(s): DDIMER in the last 72 hours.  Cardiac Enzymes No results for input(s): CKMB, TROPONINI, MYOGLOBIN in the last 168 hours.  Invalid input(s): CK  Invalid input(s): POCBNP  Urinalysis    Component Value Date/Time   COLORURINE YELLOW 10/25/2014 1303   APPEARANCEUR CLOUDY* 10/25/2014 1303   LABSPEC 1.037* 10/25/2014 1303     PHURINE 6.5 10/25/2014 1303   GLUCOSEU NEGATIVE 10/25/2014 1303   HGBUR NEGATIVE 10/25/2014 1303   BILIRUBINUR NEGATIVE 10/25/2014 1303   KETONESUR 15* 10/25/2014 1303   PROTEINUR 30* 10/25/2014 1303   UROBILINOGEN 0.2 10/25/2014 1303   NITRITE NEGATIVE 10/25/2014 1303   LEUKOCYTESUR NEGATIVE 10/25/2014 1303    Imaging results:   Ct Abdomen Pelvis W Contrast  10/25/2014   CLINICAL DATA:  29 year old male with left lower quadrant pain, diarrhea and nausea for the past 24 hours. History of Crohn's disease.  EXAM: CT ABDOMEN AND PELVIS WITH CONTRAST  TECHNIQUE: Multidetector CT imaging of the abdomen and pelvis was performed using the standard protocol following bolus administration of intravenous contrast.  CONTRAST:  OMNIPAQUE IOHEXOL 300 MG/ML  SOLN  COMPARISON:  CT the abdomen and pelvis 04/09/2009.  FINDINGS: Lower chest: Innumerable tiny peribronchovascular micronodules in the right lower lobe. The largest single nodule measures 5 mm in the medial right lower lobe (image 6 of series 3).  Hepatobiliary: No cystic or solid hepatic lesions. No intra or extrahepatic biliary ductal dilatation. Gallbladder is normal in appearance.  Pancreas: No pancreatic mass. No pancreatic ductal dilatation. No pancreatic or peripancreatic fluid or inflammatory changes.  Spleen: Spleen is mildly enlarged measuring 13.3 x 4.9 x 11.3 cm (estimated splenic volume of 368 mL).  Adrenals/Urinary Tract: Bilateral adrenal glands and bilateral kidneys are normal in appearance. No hydroureteronephrosis. Urinary bladder is normal in appearance.  Stomach/Bowel: The appearance of the stomach is normal. Status post subtotal colectomy and partial small bowel resection. There is some soft tissue thickening near the ileocolic anastomosis (colonic side). Several dilated loops of distal small bowel are noted, measuring up to 5.2 cm in diameter shortly before the anastomosis, however, no definite signs to suggest bowel obstruction  are noted at this time. There is some hypervascularity and haziness in the ileocolic mesentery adjacent to the anastomosis, and there is a small amount of adjacent free fluid, suggesting some active inflammation. There several more proximal loops of small bowel which appear narrowed and thick walled, most notably in the  proximal jejunum (image 37 of series 2). Other thickened loops of small bowel in the right side of the abdomen are favored to be related to complete decompression, as there are no surrounding inflammatory changes adjacent to these loops.  Vascular/Lymphatic: No significant atherosclerotic disease, aneurysm or dissection identified in the abdominal or pelvic vasculature. No lymphadenopathy noted in the abdomen or pelvis.  Reproductive: Prostate gland and seminal vesicles are unremarkable in appearance.  Other: No significant volume of ascites.  No pneumoperitoneum.  Musculoskeletal: There are no aggressive appearing lytic or blastic lesions noted in the visualized portions of the skeleton.  IMPRESSION: 1. Postoperative changes of subtotal colectomy and partial small bowel resection, as above, with mild thickening near the ileocolonic anastomosis, and mild hypervascularity and inflammatory changes in the adjacent mesentery, suggesting some activity in this patient with history of Crohn's disease. 2. Mild splenomegaly. 3. Multiple tiny peribronchovascular micronodules in the right lower lobe, new compared to remote prior study 04/10/2011. These are nonspecific, but favored to be benign, potentially sequela of prior episodes of aspiration. 4. Additional incidental findings, as above.   Electronically Signed   By: Trudie Reed M.D.   On: 10/25/2014 14:45     Assessment & Plan  Principal Problem:   Crohn's colitis -Admit to medical floor -Gastroenterology assisting with management -GI recommends bowel rest and only allowing clears, IV steroids and symptom management -Empiric antibiotics;  patient typically takes Cipro and Flagyl-have discussed with gastroenterology physician assistant and will start with Flagyl which has anti-inflammatory properties as well -CT scan on 9/3 showed several dilated small bowel loops before the anastomosis without definite signs of bowel obstruction-current abdomen although tender is soft so do not suspect underlying ileus or bowel obstruction at this juncture -We'll attempt to continue Imuran with sips of liquids -IV Dilaudid when necessary with scheduled Toradol/Pepcid for pain management and anti-inflammatory properties  -GI also recommends checking stool for C. difficile as this is a routine standard of care  Active Problems:   Dehydration, moderate -Intractable nausea and vomiting -Replace volume loss with IV fluids    Acute hypokalemia -Secondary to GI losses -IV potassium -Follow electrolytes    Refractory nausea and vomiting -Multifactorial: Secondary to Crohn's flare as well as intolerance to oral pain medications -Bowel rest at least for the first 24 hours recommended by GI    Acute hyperglycemia -Likely related to steroids -Less than 200; if repeat glucose elevated in a.m. consider following CBGs every 4 hours    Microcytic anemia -Chronic and has received IV iron in the past -Baseline hemoglobin unknown but currently appears stable around 11 but follow-up CBC after hydration    Bipolar affective with anxiety disorder -We'll continue preadmission Neurontin and Xanax with sip of water as tolerated to prevent breakthrough episodes    DVT Prophylaxis: Lovenox  Family Communication:   No family at bedside  Code Status:  Full code  Condition:  Stable  Discharge disposition: Anticipate discharge within next 36-72 hours pending improvement in Crohn's flare symptoms and ability to tolerate diet and other oral medication  Time spent in minutes : 60      ELLIS,ALLISON L. ANP on 10/26/2014 at 1:11 PM  Between 7am to 7pm -  Pager - [phone]  After 7pm go to [email or web address] - password [email or web address]  And look for the night coverage person covering me after hours  [facility]  Addendum  I personally evaluated patient on 10/26/2014 and agree with above findings. Andre [name] is a 29 year old woman  with a history of Crohn's disease diagnosed at the age of 29 years old reporting having multiple hospitalizations back in his home state of [place] for Crohn's exacerbations. He was in the area vacationing, presented to the emergency department with complaints of abdominal pain associated with nausea and diarrhea. On physical exam he had markedly tenderness to palpation across generalized abdomen. Lab work revealed a white count of 16,100 with a lactate of 4.6. He was further worked up with a CT scan of abdomen and pelvis with IV contrast that revealed changes reflective of subtotal colectomy and partial small bowel resection. There was mild thickening near the ileocolonic stenosis with mild hypervascularity and inflammatory changes in the adjacent mesentery. Case discussed with GI, will initiate steroids therapy with Solu-Medrol as well as empiric IV antibiotic therapy, as needed IV narcotic analgesia and as needed IV anti-emetic therapy.

## 2014-10-26 NOTE — Consult Note (Signed)
[facility] Consult: 1:10 PM 10/26/2014  LOS: 0 days     Referring Provider: Dr  Fayrene Fearing in ED  Primary Care Physician:  No primary care provider on file. patient is from out of town Sports coach:  Gentry Fitz.  Dr. Kyla Balzarine [phone], She works through [employer] GI service and out pt clinic.    Reason for Consultation:  Crohn's flare.    HPI: Andre [name] is a 29 y.o. male.  He lives in [place], [place].  He is here visiting a friend in [place]. PMH long-standing Crohn's disease since age 29.  He has undergone a total of 3 separate bowel resections resulting in subtotal colectomy with what sounds like terminal ileal resection. He has never had an ostomy..  Bipolar disorder/anxiety.  Patient says he's been on prednisone most of his life. His last lower endoscopy as well as an EGD were performed around March 2016. At the time they noted mild inflammation in the region of the anastomosis and some small bowel stricturing short while later he was hospitalized with a IBD flare. About 2 months ago his prednisone was increased from 20 mg daily to 40 mg daily. This medication increase was due to CT findings showing increased anastomosis inflammation, however the patient wasn't having clinical flare.  Also takes 175 mg Imuran daily.  Patient had anaphylaxis to Remicade. Both Humira and symptoms he had caused bad rash and were not effective. His GI doctor is in the process of trying to get him approved to start on Entyvio.   Starting on Thursday, 4 days ago, he started having Crohn's flare with increased diarrhea. Normally he has 3-4 loose stools daily but with a flare was having more watery stools on the hour. He has scant amount of blood but this is not  bloody diarrhea. There was some nausea but it was not severe. The next day, Friday, began left lower quadrant pain. On Saturday he came to the emergency room and CT scan 9/3 showed changes consistent with subtotal colectomy, partial small bowel resection.  Inflammatory changes near ileocolonic anastomosis and adjacent mesentery. Also noted was mild splenomegaly and some tiny right lower lobe micronodules which are felt to be benign. His sedimentation rate was 16. He was hydrated and improved after analgesia 6 and anti-medics and sent home with dose increase of prednisone to 80 mg daily, PRN Zofran and Percocet. There was some discussion about possibly admitting him but he was okay with discharge home. However he has developed more severe nausea, perhaps due to the Percocet. In any case he is unable to keep down his meds and returns to the emergency room.  Labs yesterday as well as today show a potassium of 3.1. There is no renal insufficiency. Blood glucose has gone from 97-122. LFTs are normal.  WBCs yesterday were 12, they're 16.1 today. He has a microcytosis, MCV 77, hemoglobin is 11.    Past Medical History  Diagnosis Date  . Anxiety   . Bipolar depression   . Crohn  disease 1997  . Anemia     Around 2013 required PRBC transfusions. Has received parenteral iron in past. Has taken oral iron in past.    Past Surgical History  Procedure Laterality Date  . Subtotal colectomy      Has undergone a total of 3 separate bowel resections including terminal ileal resection and the equivalent of subtotal colectomy. Last surgery was in 2010.    Prior to Admission medications   Medication Sig Start Date End Date Taking? Authorizing Provider  ALPRAZolam Prudy Feeler) 0.5 MG tablet Take 0.5 mg by mouth 2 (two) times daily.   Yes Historical Provider, MD  azaTHIOprine (IMURAN) 50 MG tablet Take 175 mg by mouth daily.   Yes Historical Provider, MD  gabapentin (NEURONTIN) 300 MG capsule Take 900 mg by mouth 3  (three) times daily.   Yes Historical Provider, MD  ondansetron (ZOFRAN ODT) 4 MG disintegrating tablet Take 1 tablet (4 mg total) by mouth every 8 (eight) hours as needed for nausea or vomiting. 10/25/14  Yes Barrett Henle, PA-C  oxyCODONE-acetaminophen (PERCOCET/ROXICET) 5-325 MG per tablet Take 1 tablet by mouth every 4 (four) hours as needed for severe pain. 10/25/14  Yes Barrett Henle, PA-C  predniSONE (DELTASONE) 20 MG tablet Take 2 tablets (40 mg total) by mouth 2 (two) times daily. 10/25/14  Yes Barrett Henle, PA-C    Scheduled Meds: . ketorolac  15 mg Intravenous 4 times per day  . methylPREDNISolone (SOLU-MEDROL) injection  40 mg Intravenous 3 times per day   Infusions: . sodium chloride    . 0.9 % NaCl with KCl 20 mEq / L    . famotidine (PEPCID) IV    . metronidazole    . potassium chloride Stopped (10/26/14 1249)   PRN Meds:    Allergies as of 10/26/2014 - Review Complete 10/26/2014  Allergen Reaction Noted  . Compazine [prochlorperazine]  10/25/2014  . Lithium  10/25/2014  . Remicade [infliximab]  10/25/2014    No family history on file.  Social History   Social History  . Marital Status: Single    Spouse Name: N/A  . Number of Children: N/A  . Years of Education: N/A   Occupational History  . Not on file.   Social History Main Topics  . Smoking status: Never Smoker   . Smokeless tobacco: Not on file  . Alcohol Use: Yes  . Drug Use: No  . Sexual Activity: Not on file   Other Topics Concern  . Not on file   Social History Narrative    REVIEW OF SYSTEMS: Constitutional:  Weight is stable. No falls. ENT:  No nose bleeds Pulm:  No shortness breath, no cough. No history pneumonia. CV:  No palpitations, no LE edema.  GU:  No hematuria, no frequency GI:  Per HPI Heme:  His anemia has been treated with both parenteral iron as well as oral iron. He hasn't taken oral iron in at least one year.   Transfusions:  He was transfused  with PRBCs around 2013. Neuro:  No headaches, no peripheral tingling or numbness Derm:  No itching, no rash or sores.  Endocrine:  No sweats or chills.  No polyuria or dysuria Immunization:  Not queried. Travel:  None beyond local counties in last few months.    PHYSICAL EXAM: Vital signs in last 24 hours: Filed Vitals:   10/26/14 1245  BP: 126/79  Pulse: 66  Temp:  98.9   Resp: 11   Wt Readings from  Last 3 Encounters:  No data found for Wt    General: Somewhat cushingoid, pale, comfortable WN. Head:  Slight cushingoid facies. No asymmetry, no signs of head trauma.  Eyes:  No conjunctival pallor or scleral icterus. EOMI. Ears:  No hearing deficits  Nose:  No nasal congestion no nasal discharge Mouth:  Moist, clear oral mucosa. Fair dentition. Neck:  No JVD, no TMG, no masses Lungs:  Clear bilaterally. No labored breathing or cough. Heart: RRR. No MRG. S1/S2 audible. Abdomen:  Soft.  ND, slight mid abominal tenderness.    Rectal: Deferred.   Musc/Skeltl: No joint swelling, contracture deformity or redness Extremities:  No CCE.  Neurologic:  Oriented 3, moves all 4 limbs. Limb strength not tested. Skin:  No rash, no sores,  Tattoos:  None seen Nodes:  No cervical adenopathy.    Psych:  Pleasant, calm, cooperative.   Intake/Output from previous day:   Intake/Output this shift: Total I/O In: 2500 [I.V.:2500] Out: -   LAB RESULTS:  Recent Labs  10/25/14 1154 10/26/14 1032  WBC 12.0* 16.1*  HGB 11.9* 11.0*  HCT 38.4* 35.5*  PLT 279 253   BMET Lab Results  Component Value Date   NA 144 10/26/2014   NA 143 10/25/2014   NA 140 04/22/2009   K 3.1* 10/26/2014   K 3.1* 10/25/2014   K 3.8 04/22/2009   CL 110 10/26/2014   CL 111 10/25/2014   CL 115* 04/22/2009   CO2 21* 10/26/2014   CO2 25 10/25/2014   CO2 23 04/22/2009   GLUCOSE 122* 10/26/2014   GLUCOSE 97 10/25/2014   GLUCOSE 97 04/22/2009   BUN 13 10/26/2014   BUN 12 10/25/2014   BUN 6 04/22/2009     CREATININE 0.93 10/26/2014   CREATININE 1.05 10/25/2014   CREATININE 0.71 04/22/2009   CALCIUM 9.2 10/26/2014   CALCIUM 8.8* 10/25/2014   CALCIUM 7.7* 04/22/2009   LFT  Recent Labs  10/25/14 1154  PROT 6.0*  ALBUMIN 3.5  AST 21  ALT 25  ALKPHOS 60  BILITOT 0.7   PT/INR No results found for: INR, PROTIME Hepatitis Panel No results for input(s): HEPBSAG, HCVAB, HEPAIGM, HEPBIGM in the last 72 hours. C-Diff No components found for: CDIFF Lipase     Component Value Date/Time   LIPASE 27 04/19/2009 2110    Drugs of Abuse  No results found for: LABOPIA, COCAINSCRNUR, LABBENZ, AMPHETMU, THCU, LABBARB   RADIOLOGY STUDIES: Ct Abdomen Pelvis W Contrast  10/25/2014   CLINICAL DATA:  29 year old male with left lower quadrant pain, diarrhea and nausea for the past 24 hours. History of Crohn's disease.  EXAM: CT ABDOMEN AND PELVIS WITH CONTRAST  TECHNIQUE: Multidetector CT imaging of the abdomen and pelvis was performed using the standard protocol following bolus administration of intravenous contrast.  CONTRAST:  OMNIPAQUE IOHEXOL 300 MG/ML  SOLN  COMPARISON:  CT the abdomen and pelvis 04/09/2009.  FINDINGS: Lower chest: Innumerable tiny peribronchovascular micronodules in the right lower lobe. The largest single nodule measures 5 mm in the medial right lower lobe (image 6 of series 3).  Hepatobiliary: No cystic or solid hepatic lesions. No intra or extrahepatic biliary ductal dilatation. Gallbladder is normal in appearance.  Pancreas: No pancreatic mass. No pancreatic ductal dilatation. No pancreatic or peripancreatic fluid or inflammatory changes.  Spleen: Spleen is mildly enlarged measuring 13.3 x 4.9 x 11.3 cm (estimated splenic volume of 368 mL).  Adrenals/Urinary Tract: Bilateral adrenal glands and bilateral kidneys are normal in appearance. No  hydroureteronephrosis. Urinary bladder is normal in appearance.  Stomach/Bowel: The appearance of the stomach is normal. Status post  subtotal colectomy and partial small bowel resection. There is some soft tissue thickening near the ileocolic anastomosis (colonic side). Several dilated loops of distal small bowel are noted, measuring up to 5.2 cm in diameter shortly before the anastomosis, however, no definite signs to suggest bowel obstruction are noted at this time. There is some hypervascularity and haziness in the ileocolic mesentery adjacent to the anastomosis, and there is a small amount of adjacent free fluid, suggesting some active inflammation. There several more proximal loops of small bowel which appear narrowed and thick walled, most notably in the proximal jejunum (image 37 of series 2). Other thickened loops of small bowel in the right side of the abdomen are favored to be related to complete decompression, as there are no surrounding inflammatory changes adjacent to these loops.  Vascular/Lymphatic: No significant atherosclerotic disease, aneurysm or dissection identified in the abdominal or pelvic vasculature. No lymphadenopathy noted in the abdomen or pelvis.  Reproductive: Prostate gland and seminal vesicles are unremarkable in appearance.  Other: No significant volume of ascites.  No pneumoperitoneum.  Musculoskeletal: There are no aggressive appearing lytic or blastic lesions noted in the visualized portions of the skeleton.  IMPRESSION: 1. Postoperative changes of subtotal colectomy and partial small bowel resection, as above, with mild thickening near the ileocolonic anastomosis, and mild hypervascularity and inflammatory changes in the adjacent mesentery, suggesting some activity in this patient with history of Crohn's disease. 2. Mild splenomegaly. 3. Multiple tiny peribronchovascular micronodules in the right lower lobe, new compared to remote prior study 04/10/2011. These are nonspecific, but favored to be benign, potentially sequela of prior episodes of aspiration. 4. Additional incidental findings, as above.    Electronically Signed   By: Trudie Reed M.D.   On: 10/25/2014 14:45    ENDOSCOPIC STUDIES: Per HPI  IMPRESSION:   *  Long-standing Crohn's disease. Status post subtotal colectomy and ileal resection. Now with 4 days of flaring symptoms and anastomotic inflammation confirmed by yesterday's CT scan. This refractory to large doses of prednisone. Maintained on chronic Imuran but disease management hampered by allergies and/or lack of response to biologic therapies.  *  Microcytosis without severe anemia. However suspect that the hemoglobin of 11 may drop down once he is better hydrated. He has been on oral iron in the past but not for the last year or more. He is also been transfused with PRBCs and treated with parenteral iron in the past.  *  Hyperglycemia, not surprising given the high-dose pterygoids which he takes  *  Hypokalemia. Received runs of potassium and potassium added to IV fluids.   PLAN:     *  Assay stool for C. difficile, need to rule this out. Agree with Solu-Medrol, will discuss exact dose with Dr. Rhea Belton. Also agree with IV Flagyl. For now would hold off on initiating Cipro. Patient can have clear liquids as tolerated. Continue PRN and anti-emetics.  Agree with empiric treatment with famotidine.   Jennye Moccasin  10/26/2014, 1:10 PM Pager: [phone]

## 2014-10-26 NOTE — ED Provider Notes (Signed)
CSN: [id number]     Arrival date & time 10/26/14  1004 History   First MD Initiated Contact with Patient 10/26/14 1007     Chief Complaint  Patient presents with  . Abdominal Pain  . Nausea     HPI  Patient presents for evaluation with worsening symptoms of abdominal pain, nausea and vomiting, and diarrhea.  History of Crohn's disease. He resides in [place]. He is visiting here. Seen by myself with PA yesterday. Had apparent flare. Reports almost "always" having some blood in the stools. No increase in the amount of blood but does have persistent bloody stools. Has had increased frequency of stools one every hour or 2 for the last few days. Help nausea and abdominal pain yesterday. CT scan showed inflammation with trace free fluid around his [facility] colon anastomosis. However no obvious obstruction. Some dilated loops of small bowel. He felt considerably better after IV antiemetics and pain medication. Was offered admission but felt as though he could try it as an outpatient. He was given IV Solu-Medrol and discharged with increase of his prednisone from 40 daily to twice a day.  Presents today stating he has barely kept any of his fluids or medications down. Has persistent vomiting. Still has diarrhea. Has had obstructions in the past and does not feel bloated or distended as though he is obstructed.  Had subtotal colectomy one year ago. His abdominal wound is still healing. However appears uninfected and intact.  Pt has allergy to Remicaid (Anaphylaxis), Humira (rash), and Mesalamine (rash). Is currently on Imuran 200mg  daily, and Prednisone 40qd for the last 6 weeks by his GI in [place] (increased yesterday to 40 bid at d/c).    Past Medical History  Diagnosis Date  . Anxiety   . Bipolar depression   . Crohn disease 1997  . Anemia     Around 2013 required PRBC transfusions. Has received parenteral iron in past. Has taken oral iron in past.   Past Surgical History  Procedure Laterality  Date  . Subtotal colectomy      Has undergone a total of 3 separate bowel resections including terminal ileal resection and the equivalent of subtotal colectomy. Last surgery was in 2010.  Marland Kitchen Flexible sigmoidoscopy N/A 10/29/2014    Procedure: FLEXIBLE SIGMOIDOSCOPY;  Surgeon: Ruffin Frederick, MD;  Location: [facility] ENDOSCOPY;  Service: Gastroenterology;  Laterality: N/A;   History reviewed. No pertinent family history. Social History  Substance Use Topics  . Smoking status: Current Every Day Smoker -- 1.00 packs/day for 5 years    Types: Cigarettes  . Smokeless tobacco: Never Used  . Alcohol Use: No    Review of Systems  Constitutional: Negative for fever, chills, diaphoresis, appetite change and fatigue.  HENT: Negative for mouth sores, sore throat and trouble swallowing.   Eyes: Negative for visual disturbance.  Respiratory: Negative for cough, chest tightness, shortness of breath and wheezing.   Cardiovascular: Negative for chest pain.  Gastrointestinal: Positive for nausea, vomiting, abdominal pain and blood in stool. Negative for diarrhea and abdominal distention.  Endocrine: Negative for polydipsia, polyphagia and polyuria.  Genitourinary: Negative for dysuria, frequency and hematuria.  Musculoskeletal: Negative for gait problem.  Skin: Negative for color change, pallor and rash.  Neurological: Negative for dizziness, syncope, light-headedness and headaches.  Hematological: Does not bruise/bleed easily.  Psychiatric/Behavioral: Negative for behavioral problems and confusion.      Allergies  Compazine; Lithium; and Remicade  Home Medications   Prior to Admission medications   Medication  Sig Start Date End Date Taking? Authorizing Provider  ALPRAZolam Prudy Feeler) 0.5 MG tablet Take 0.5 mg by mouth 2 (two) times daily.   Yes Historical Provider, MD  azaTHIOprine (IMURAN) 50 MG tablet Take 175 mg by mouth daily.   Yes Historical Provider, MD  gabapentin (NEURONTIN) 300 MG  capsule Take 900 mg by mouth 3 (three) times daily.   Yes Historical Provider, MD  ondansetron (ZOFRAN ODT) 4 MG disintegrating tablet Take 1 tablet (4 mg total) by mouth every 8 (eight) hours as needed for nausea or vomiting. 10/25/14  Yes Barrett Henle, PA-C  oxyCODONE-acetaminophen (PERCOCET/ROXICET) 5-325 MG per tablet Take 1 tablet by mouth every 4 (four) hours as needed for severe pain. 10/25/14  Yes Barrett Henle, PA-C  predniSONE (DELTASONE) 20 MG tablet Take 2 tablets (40 mg total) by mouth 2 (two) times daily. 10/25/14  Yes Satira Sark Nadeau, PA-C   BP 116/74 mmHg  Pulse 68  Temp(Src) 97.8 F (36.6 C) (Oral)  Resp 19  Ht  (1.575 m)  Wt 158 lb 4.6 oz (71.8 kg)  BMI 28.94 kg/m2  SpO2 100% Physical Exam  Constitutional: He is oriented to person, place, and time. He appears well-developed and well-nourished. No distress.  HENT:  Head: Normocephalic.  Eyes: Conjunctivae are normal. Pupils are equal, round, and reactive to light. No scleral icterus.  Neck: Normal range of motion. Neck supple. No thyromegaly present.  Cardiovascular: Normal rate and regular rhythm.  Exam reveals no gallop and no friction rub.   No murmur heard. Pulmonary/Chest: Effort normal and breath sounds normal. No respiratory distress. He has no wheezes. He has no rales.  Abdominal: Soft. Bowel sounds are normal. He exhibits no distension. There is tenderness. There is no rebound.  Bowel sounds slightly hypoactive.  Musculoskeletal: Normal range of motion.  Neurological: He is alert and oriented to person, place, and time.  Skin: Skin is warm and dry. No rash noted.  Psychiatric: He has a normal mood and affect. His behavior is normal.    ED Course  Procedures (including critical care time) Labs Review Labs Reviewed  CBC WITH DIFFERENTIAL/PLATELET - Abnormal; Notable for the following:    WBC 16.1 (*)    Hemoglobin 11.0 (*)    HCT 35.5 (*)    MCV 77.7 (*)    MCH 24.1 (*)     Neutrophils Relative % 93 (*)    Neutro Abs 15.0 (*)    Lymphocytes Relative 4 (*)    All other components within normal limits  BASIC METABOLIC PANEL - Abnormal; Notable for the following:    Potassium 3.1 (*)    CO2 21 (*)    Glucose, Bld 122 (*)    All other components within normal limits  LACTIC ACID, PLASMA - Abnormal; Notable for the following:    Lactic Acid, Venous 2.1 (*)    All other components within normal limits  CBC - Abnormal; Notable for the following:    WBC 13.4 (*)    Hemoglobin 10.4 (*)    HCT 34.3 (*)    MCH 23.9 (*)    All other components within normal limits  COMPREHENSIVE METABOLIC PANEL - Abnormal; Notable for the following:    CO2 19 (*)    Glucose, Bld 153 (*)    BUN <5 (*)    Calcium 8.5 (*)    Total Protein 6.0 (*)    Albumin 3.2 (*)    All other components within normal limits  COMPREHENSIVE  METABOLIC PANEL - Abnormal; Notable for the following:    Potassium 3.2 (*)    BUN <5 (*)    Calcium 8.6 (*)    Total Protein 5.8 (*)    Albumin 3.2 (*)    All other components within normal limits  CBC - Abnormal; Notable for the following:    Hemoglobin 11.0 (*)    HCT 37.0 (*)    MCH 23.5 (*)    MCHC 29.7 (*)    All other components within normal limits  IRON AND TIBC - Abnormal; Notable for the following:    Iron 31 (*)    Saturation Ratios 10 (*)    All other components within normal limits  FERRITIN - Abnormal; Notable for the following:    Ferritin 17 (*)    All other components within normal limits  CBC - Abnormal; Notable for the following:    WBC 12.2 (*)    Hemoglobin 11.0 (*)    HCT 36.1 (*)    MCH 23.8 (*)    All other components within normal limits  BASIC METABOLIC PANEL - Abnormal; Notable for the following:    Potassium 3.0 (*)    Glucose, Bld 126 (*)    BUN <5 (*)    Calcium 8.1 (*)    All other components within normal limits  I-STAT CG4 LACTIC ACID, ED - Abnormal; Notable for the following:    Lactic Acid, Venous 4.60  (*)    All other components within normal limits  C DIFFICILE QUICK SCREEN W PCR REFLEX  OVA AND PARASITE EXAMINATION  HEMOGLOBIN A1C  VITAMIN B12  FOLATE  SEDIMENTATION RATE  C-REACTIVE PROTEIN  CMV (CYTOMEGALOVIRUS) DNA ULTRAQUANT, PCR  SURGICAL PATHOLOGY    Imaging Review Mr Adora Fridge W/o W/cm  10/31/2014   CLINICAL DATA:  History of Crohn's disease  EXAM: MR ABDOMEN AND PELVIS WITHOUT AND WITH CONTRAST (MR ENTEROGRAPHY)  TECHNIQUE: Multiplanar, multisequence MRI of the abdomen and pelvis was performed both before and during bolus administration of intravenous contrast. Negative oral contrast VoLumen was given.  CONTRAST:  40mL MULTIHANCE GADOBENATE DIMEGLUMINE 529 MG/ML IV SOLN  COMPARISON:  10/25/2014  FINDINGS: MR ABDOMEN FINDINGS  Hepatobiliary: No focal liver abnormality identified. The gallbladder is normal. There is no biliary dilatation.  Pancreas: Normal appearance of the pancreas.  Spleen: Negative  Adrenals/Urinary Tract: The adrenal glands are normal. Normal appearance of the kidneys. No focal kidney abnormality identified.  Stomach/Bowel: The stomach appears within normal limits. Mild increased caliber of the small bowel loops. The proximal jejunal loops measure up to 2.9 cm. The previously noted proximal small bowel loop with wall thickening is no longer visualized. The dilated distal small bowel loops near the enterocolonic anastomosis have improved currently measuring up to 3.4 cm versus 5.5 cm before. There is stable to improved inflammation in the ileocolic mesenterary adjacent to the enterocolonic anastomosis. Resolution of previous right lower quadrant free fluid. No fluid collections identified.  Vascular/Lymphatic: The abdominal aorta appears normal. No enlarged upper abdominal lymph nodes.  Other: No fluid collections or free fluid identified.  Musculoskeletal: No abnormal signal identified throughout the bone marrow.  MR PELVIS FINDINGS  Urinary Tract: The urinary bladder  is unremarkable.  Bowel: The rectum appears normal. No wall thickening or inflammation involving the rectum identified.  Vascular/Lymphatic: Iliac vessels are within normal limits. No pelvic or inguinal adenopathy noted.  Reproductive: Prostate gland and seminal vesicles appear normal.  Other: No free fluid or fluid collections identified within  the pelvis.  Musculoskeletal: No abnormal signal identified within the bone marrow.  IMPRESSION: 1. Persistent but improved small bowel dilatation. 2. Ileocolic mesenteric inflammation is stable to improved in the interval. Free fluid in the right lower quadrant has resolved in the interval. At this time there is no evidence for abscess formation or penetrating disease. 3. No new findings identified.   Electronically Signed   By: Signa Kell M.D.   On: 10/31/2014 19:58   Mr [name] Pelvis W/o W/cm  10/31/2014   CLINICAL DATA:  History of Crohn's disease  EXAM: MR ABDOMEN AND PELVIS WITHOUT AND WITH CONTRAST (MR ENTEROGRAPHY)  TECHNIQUE: Multiplanar, multisequence MRI of the abdomen and pelvis was performed both before and during bolus administration of intravenous contrast. Negative oral contrast VoLumen was given.  CONTRAST:  16mL MULTIHANCE GADOBENATE DIMEGLUMINE 529 MG/ML IV SOLN  COMPARISON:  10/25/2014  FINDINGS: MR ABDOMEN FINDINGS  Hepatobiliary: No focal liver abnormality identified. The gallbladder is normal. There is no biliary dilatation.  Pancreas: Normal appearance of the pancreas.  Spleen: Negative  Adrenals/Urinary Tract: The adrenal glands are normal. Normal appearance of the kidneys. No focal kidney abnormality identified.  Stomach/Bowel: The stomach appears within normal limits. Mild increased caliber of the small bowel loops. The proximal jejunal loops measure up to 2.9 cm. The previously noted proximal small bowel loop with wall thickening is no longer visualized. The dilated distal small bowel loops near the enterocolonic anastomosis have improved  currently measuring up to 3.4 cm versus 5.5 cm before. There is stable to improved inflammation in the ileocolic mesenterary adjacent to the enterocolonic anastomosis. Resolution of previous right lower quadrant free fluid. No fluid collections identified.  Vascular/Lymphatic: The abdominal aorta appears normal. No enlarged upper abdominal lymph nodes.  Other: No fluid collections or free fluid identified.  Musculoskeletal: No abnormal signal identified throughout the bone marrow.  MR PELVIS FINDINGS  Urinary Tract: The urinary bladder is unremarkable.  Bowel: The rectum appears normal. No wall thickening or inflammation involving the rectum identified.  Vascular/Lymphatic: Iliac vessels are within normal limits. No pelvic or inguinal adenopathy noted.  Reproductive: Prostate gland and seminal vesicles appear normal.  Other: No free fluid or fluid collections identified within the pelvis.  Musculoskeletal: No abnormal signal identified within the bone marrow.  IMPRESSION: 1. Persistent but improved small bowel dilatation. 2. Ileocolic mesenteric inflammation is stable to improved in the interval. Free fluid in the right lower quadrant has resolved in the interval. At this time there is no evidence for abscess formation or penetrating disease. 3. No new findings identified.   Electronically Signed   By: Signa Kell M.D.   On: 10/31/2014 19:58   I have personally reviewed and evaluated these images and lab results as part of my medical decision-making.   EKG Interpretation None      MDM   Final diagnoses:  Crohn's colitis, unspecified complication    Pt to be admitted.  Not tolerating out patient treatment during current flair.  Imaging yesterday showing active flair.     Rolland Porter, MD 11/01/14 1153

## 2014-10-26 NOTE — ED Notes (Signed)
Admitting at bedside 

## 2014-10-26 NOTE — ED Notes (Signed)
Pt from home for eval of ongoing LLQ abd pain, nausea-vomiting and diarrhea. Pt states seen here yesterday for same and was told had chrohns flare up. States he went home and was still having nausea and unable to keep any medicines down. nad noted.

## 2014-10-26 NOTE — Progress Notes (Signed)
Clarification: Smokes 1/2 to 1 PPD so added Nicotine patch  Junious Silk, ANP

## 2014-10-26 NOTE — Progress Notes (Signed)
Patient's Cdiff stool was negative.  Took patient off of percautions

## 2014-10-27 ENCOUNTER — Encounter (HOSPITAL_COMMUNITY): Payer: Self-pay | Admitting: General Practice

## 2014-10-27 DIAGNOSIS — D509 Iron deficiency anemia, unspecified: Secondary | ICD-10-CM | POA: Diagnosis not present

## 2014-10-27 DIAGNOSIS — K50119 Crohn's disease of large intestine with unspecified complications: Secondary | ICD-10-CM | POA: Diagnosis not present

## 2014-10-27 DIAGNOSIS — R7309 Other abnormal glucose: Secondary | ICD-10-CM | POA: Diagnosis not present

## 2014-10-27 DIAGNOSIS — K50111 Crohn's disease of large intestine with rectal bleeding: Secondary | ICD-10-CM | POA: Diagnosis not present

## 2014-10-27 DIAGNOSIS — E860 Dehydration: Secondary | ICD-10-CM | POA: Diagnosis not present

## 2014-10-27 DIAGNOSIS — K50919 Crohn's disease, unspecified, with unspecified complications: Secondary | ICD-10-CM | POA: Diagnosis not present

## 2014-10-27 DIAGNOSIS — K50811 Crohn's disease of both small and large intestine with rectal bleeding: Secondary | ICD-10-CM | POA: Diagnosis not present

## 2014-10-27 DIAGNOSIS — R1030 Lower abdominal pain, unspecified: Secondary | ICD-10-CM | POA: Diagnosis not present

## 2014-10-27 DIAGNOSIS — E876 Hypokalemia: Secondary | ICD-10-CM | POA: Diagnosis not present

## 2014-10-27 LAB — CBC
HCT: 34.3 % — ABNORMAL LOW (ref 39.0–52.0)
HEMOGLOBIN: 10.4 g/dL — AB (ref 13.0–17.0)
MCH: 23.9 pg — AB (ref 26.0–34.0)
MCHC: 30.3 g/dL (ref 30.0–36.0)
MCV: 78.9 fL (ref 78.0–100.0)
Platelets: 203 10*3/uL (ref 150–400)
RBC: 4.35 MIL/uL (ref 4.22–5.81)
RDW: 14.9 % (ref 11.5–15.5)
WBC: 13.4 10*3/uL — ABNORMAL HIGH (ref 4.0–10.5)

## 2014-10-27 LAB — COMPREHENSIVE METABOLIC PANEL
ALK PHOS: 57 U/L (ref 38–126)
ALT: 18 U/L (ref 17–63)
AST: 24 U/L (ref 15–41)
Albumin: 3.2 g/dL — ABNORMAL LOW (ref 3.5–5.0)
Anion gap: 9 (ref 5–15)
BUN: <5 mg/dL — ABNORMAL LOW (ref 6–20)
CALCIUM: 8.5 mg/dL — AB (ref 8.9–10.3)
CO2: 19 mmol/L — AB (ref 22–32)
CREATININE: 0.77 mg/dL (ref 0.61–1.24)
Chloride: 110 mmol/L (ref 101–111)
GFR calc non Af Amer: >60 mL/min (ref >60)
GLUCOSE: 153 mg/dL — AB (ref 65–99)
Potassium: 4.1 mmol/L (ref 3.5–5.1)
SODIUM: 138 mmol/L (ref 135–145)
Total Bilirubin: 0.7 mg/dL (ref 0.3–1.2)
Total Protein: 6 g/dL — ABNORMAL LOW (ref 6.5–8.1)

## 2014-10-27 MED ORDER — METHYLPREDNISOLONE SODIUM SUCC 40 MG IJ SOLR
40.0000 mg | Freq: Every day | INTRAMUSCULAR | Status: DC
  Administered 2014-10-28 – 2014-11-02 (×6): 40 mg via INTRAVENOUS
  Filled 2014-10-27 (×6): qty 1

## 2014-10-27 MED ORDER — FAMOTIDINE 20 MG PO TABS
20.0000 mg | ORAL_TABLET | Freq: Every day | ORAL | Status: DC
  Administered 2014-10-28 – 2014-11-02 (×6): 20 mg via ORAL
  Filled 2014-10-27 (×6): qty 1

## 2014-10-27 NOTE — Progress Notes (Signed)
[facility] PROGRESS NOTE  Deryck Hippler ZOX:[id number] DOB: [birth year] DOA: 10/26/2014 PCP: No primary care provider on file.  Assessment/Plan: Principal Problem:   Crohn's colitis -Gastroenterologist are currently on board and assisting -We'll continue supportive therapy  Active Problems:   Acute hypokalemia - Resolved    Acute hyperglycemia - Elevated most likely secondary to recent prednisone use - Obtain hemoglobin A1c    Refractory nausea and vomiting - Improved continue antiemetics    Dehydration, moderate - Most likely cause of elevated lactic acid -Trending down on last check    Microcytic anemia -  Will continue to monitor hgb levels   Code Status: full Family Communication: no family at bedside Disposition Plan: pending final recommendations from GI specialist   Consultants:  GI  Procedures:  None  Antibiotics:  Metronidazole  HPI/Subjective: Pt has no new complaints. States that diarrhea is slowing down  Objective: Filed Vitals:   10/27/14 1358  BP: 137/92  Pulse: 73  Temp: 98.8 F (37.1 C)  Resp: 16    Intake/Output Summary (Last 24 hours) at 10/27/14 1629 Last data filed at 10/27/14 1358  Gross per 24 hour  Intake 5127.5 ml  Output    825 ml  Net 4302.5 ml   Filed Weights   10/27/14 0524  Weight: 75.6 kg (166 lb 10.7 oz)    Exam:   General:  Pt in NAD, alert and awake  Cardiovascular: rrr, no mrg  Respiratory: cta bl, no wheezes  Abdomen: soft, NT, ND  Musculoskeletal: no cyanosis or clubbing   Data Reviewed: Basic Metabolic Panel:  Recent Labs Lab 10/25/14 1154 10/26/14 1032 10/27/14 0447  NA 143 144 138  K 3.1* 3.1* 4.1  CL 111 110 110  CO2 25 21* 19*  GLUCOSE 97 122* 153*  BUN 12 13 <5*  CREATININE 1.05 0.93 0.77  CALCIUM 8.8* 9.2 8.5*   Liver Function Tests:  Recent Labs Lab 10/25/14 1154 10/27/14 0447  AST 21 24  ALT 25 18  ALKPHOS 60 57  BILITOT 0.7 0.7  PROT 6.0* 6.0*  ALBUMIN 3.5  3.2*   No results for input(s): LIPASE, AMYLASE in the last 168 hours. No results for input(s): AMMONIA in the last 168 hours. CBC:  Recent Labs Lab 10/25/14 1154 10/26/14 1032 10/27/14 0447  WBC 12.0* 16.1* 13.4*  NEUTROABS  --  15.0*  --   HGB 11.9* 11.0* 10.4*  HCT 38.4* 35.5* 34.3*  MCV 77.3* 77.7* 78.9  PLT 279 253 203   Cardiac Enzymes: No results for input(s): CKTOTAL, CKMB, CKMBINDEX, TROPONINI in the last 168 hours. BNP (last 3 results) No results for input(s): BNP in the last 8760 hours.  ProBNP (last 3 results) No results for input(s): PROBNP in the last 8760 hours.  CBG: No results for input(s): GLUCAP in the last 168 hours.  Recent Results (from the past 240 hour(s))  C difficile quick scan w PCR reflex     Status: None   Collection Time: 10/26/14  4:04 PM  Result Value Ref Range Status   C Diff antigen NEGATIVE NEGATIVE Final   C Diff toxin NEGATIVE NEGATIVE Final   C Diff interpretation Negative for toxigenic C. difficile  Final     Studies: No results found.  Scheduled Meds: . sodium chloride   Intravenous Once  . ALPRAZolam  0.5 mg Oral BID  . azaTHIOprine  175 mg Oral Daily  . enoxaparin (LOVENOX) injection  40 mg Subcutaneous Q24H  . famotidine  20 mg Oral  Daily  . gabapentin  900 mg Oral TID  . [START ON 10/28/2014] methylPREDNISolone (SOLU-MEDROL) injection  40 mg Intravenous Daily  . metronidazole  500 mg Intravenous Q8H  . nicotine  21 mg Transdermal Q24H   Continuous Infusions: . 0.9 % NaCl with KCl 20 mEq / L 75 mL/hr at 10/27/14 1110     Time spent: 10 minutes    Penny Pia  [facility] Pager [phone] If 7PM-7AM, please contact night-coverage at [email or web address], password [facility] 10/27/2014, 4:29 PM  LOS: 1 day

## 2014-10-27 NOTE — Progress Notes (Signed)
Daily Rounding Note  10/27/2014, 9:57 AM  LOS: 1 day   SUBJECTIVE:       Abdominal pain and frequent, non-bloody diarrhea continues.  No more nausea.  Tolerating clears.   Solumedrol giving rise to sleep issues (has happened in past).  Used 9 mg Dilaudid yesterday, 5 mg so far today. Doses vary from 1 to 4 mg each. Also getting Toradol prn.  Has not required antiemetics since 1040 yesterday AM. .   OBJECTIVE:         Vital signs in last 24 hours:    Temp:  [97.9 F (36.6 C)-98.9 F (37.2 C)] 97.9 F (36.6 C) (09/05 0524) Pulse Rate:  [46-92] 46 (09/05 0524) Resp:  [11-18] 16 (09/05 0524) BP: (106-131)/(67-93) 131/79 mmHg (09/05 0524) SpO2:  [99 %-100 %] 100 % (09/05 0524) Weight:  [166 lb 10.7 oz (75.6 kg)] 166 lb 10.7 oz (75.6 kg) (09/05 0524) Last BM Date: 10/26/14 Filed Weights   10/27/14 0524  Weight: 166 lb 10.7 oz (75.6 kg)   General: pale, comfortable, cushingoid, non-toxic appearing   Heart: RRR Chest: clear bil.  No SOB, raspy vocal quality.  Abdomen: soft, tender on left, no guard or rebound.  BS present  Extremities: no CCE Neuro/Psych:  Cooperative, pleasant, calm.   Intake/Output from previous day: 09/04 0701 - 09/05 0700 In: 6187.5 [P.O.:1440; I.V.:4747.5] Out: 825 [Urine:825]  Intake/Output this shift: Total I/O In: 480 [P.O.:480] Out: -   Lab Results:  Recent Labs  10/25/14 1154 10/26/14 1032 10/27/14 0447  WBC 12.0* 16.1* 13.4*  HGB 11.9* 11.0* 10.4*  HCT 38.4* 35.5* 34.3*  PLT 279 253 203   BMET  Recent Labs  10/25/14 1154 10/26/14 1032 10/27/14 0447  NA 143 144 138  K 3.1* 3.1* 4.1  CL 111 110 110  CO2 25 21* 19*  GLUCOSE 97 122* 153*  BUN 12 13 <5*  CREATININE 1.05 0.93 0.77  CALCIUM 8.8* 9.2 8.5*   LFT  Recent Labs  10/25/14 1154 10/27/14 0447  PROT 6.0* 6.0*  ALBUMIN 3.5 3.2*  AST 21 24  ALT 25 18  ALKPHOS 60 57  BILITOT 0.7 0.7   PT/INR No results  for input(s): LABPROT, INR in the last 72 hours. Hepatitis Panel No results for input(s): HEPBSAG, HCVAB, HEPAIGM, HEPBIGM in the last 72 hours.  Studies/Results: Ct Abdomen Pelvis W Contrast  10/25/2014   CLINICAL DATA:  29 year old male with left lower quadrant pain, diarrhea and nausea for the past 24 hours. History of Crohn's disease.  EXAM: CT ABDOMEN AND PELVIS WITH CONTRAST  TECHNIQUE: Multidetector CT imaging of the abdomen and pelvis was performed using the standard protocol following bolus administration of intravenous contrast.  CONTRAST:  OMNIPAQUE IOHEXOL 300 MG/ML  SOLN  COMPARISON:  CT the abdomen and pelvis 04/09/2009.  FINDINGS: Lower chest: Innumerable tiny peribronchovascular micronodules in the right lower lobe. The largest single nodule measures 5 mm in the medial right lower lobe (image 6 of series 3).  Hepatobiliary: No cystic or solid hepatic lesions. No intra or extrahepatic biliary ductal dilatation. Gallbladder is normal in appearance.  Pancreas: No pancreatic mass. No pancreatic ductal dilatation. No pancreatic or peripancreatic fluid or inflammatory changes.  Spleen: Spleen is mildly enlarged measuring 13.3 x 4.9 x 11.3 cm (estimated splenic volume of 368 mL).  Adrenals/Urinary Tract: Bilateral adrenal glands and bilateral kidneys are normal in appearance. No hydroureteronephrosis. Urinary bladder is normal in appearance.  Stomach/Bowel:  The appearance of the stomach is normal. Status post subtotal colectomy and partial small bowel resection. There is some soft tissue thickening near the ileocolic anastomosis (colonic side). Several dilated loops of distal small bowel are noted, measuring up to 5.2 cm in diameter shortly before the anastomosis, however, no definite signs to suggest bowel obstruction are noted at this time. There is some hypervascularity and haziness in the ileocolic mesentery adjacent to the anastomosis, and there is a small amount of adjacent free fluid,  suggesting some active inflammation. There several more proximal loops of small bowel which appear narrowed and thick walled, most notably in the proximal jejunum (image 37 of series 2). Other thickened loops of small bowel in the right side of the abdomen are favored to be related to complete decompression, as there are no surrounding inflammatory changes adjacent to these loops.  Vascular/Lymphatic: No significant atherosclerotic disease, aneurysm or dissection identified in the abdominal or pelvic vasculature. No lymphadenopathy noted in the abdomen or pelvis.  Reproductive: Prostate gland and seminal vesicles are unremarkable in appearance.  Other: No significant volume of ascites.  No pneumoperitoneum.  Musculoskeletal: There are no aggressive appearing lytic or blastic lesions noted in the visualized portions of the skeleton.  IMPRESSION: 1. Postoperative changes of subtotal colectomy and partial small bowel resection, as above, with mild thickening near the ileocolonic anastomosis, and mild hypervascularity and inflammatory changes in the adjacent mesentery, suggesting some activity in this patient with history of Crohn's disease. 2. Mild splenomegaly. 3. Multiple tiny peribronchovascular micronodules in the right lower lobe, new compared to remote prior study 04/10/2011. These are nonspecific, but favored to be benign, potentially sequela of prior episodes of aspiration. 4. Additional incidental findings, as above.   Electronically Signed   By: Trudie Reed M.D.   On: 10/25/2014 14:45    Scheduled Meds: . sodium chloride   Intravenous Once  . ALPRAZolam  0.5 mg Oral BID  . azaTHIOprine  175 mg Oral Daily  . enoxaparin (LOVENOX) injection  40 mg Subcutaneous Q24H  . famotidine (PEPCID) IV  20 mg Intravenous Q12H  . gabapentin  900 mg Oral TID  . ketorolac  15 mg Intravenous 4 times per day  . methylPREDNISolone (SOLU-MEDROL) injection  40 mg Intravenous 3 times per day  . metronidazole  500 mg  Intravenous Q8H  . nicotine  21 mg Transdermal Q24H   Continuous Infusions: . 0.9 % NaCl with KCl 20 mEq / L 150 mL/hr at 10/27/14 0448   PRN Meds:.HYDROmorphone (DILAUDID) injection, loperamide, ondansetron (ZOFRAN) IV, promethazine   ASSESMENT:   * Long-standing Crohn's disease. Previous surgeries clinically resulting in subtotal colectomy and ileal resection. Admitted with acute flare (n/v, increased diarrhea, abd pain). CT confirms active anastomotic dz, despite Chronic high dose Predinisone.   Stool for C diff negative.  Current mgt with Solumedrol, IV flagyl, continuation of Imuran.   * Microcytic anemia.  Previous treatment with transfusions, po and parenteral iron but not iron for > one year.   * Hyperglycemia, not surprising given the high-dose steroids.   * Hypokalemia. Resolved after runs KCl.  Potassium in IVF.    PLAN   * Leave on clears for now, continue IV Flagyl, convert to po Famotidine as inpt.  Cutting back Solumedrol to 40 mg daily, larger doses not supported by clinical research in acute IBD. Cut back IVF from 15o to 75 ml per hour.  Case and meds d/w Dr Rhea Belton.   *  Consider dosing parenteral iron.  Andre [name]  10/27/2014, 9:57 AM Pager: [phone]

## 2014-10-28 DIAGNOSIS — R1030 Lower abdominal pain, unspecified: Secondary | ICD-10-CM | POA: Diagnosis not present

## 2014-10-28 DIAGNOSIS — K50919 Crohn's disease, unspecified, with unspecified complications: Secondary | ICD-10-CM | POA: Diagnosis not present

## 2014-10-28 DIAGNOSIS — K50119 Crohn's disease of large intestine with unspecified complications: Secondary | ICD-10-CM

## 2014-10-28 DIAGNOSIS — E876 Hypokalemia: Secondary | ICD-10-CM | POA: Diagnosis not present

## 2014-10-28 DIAGNOSIS — K50811 Crohn's disease of both small and large intestine with rectal bleeding: Secondary | ICD-10-CM | POA: Diagnosis not present

## 2014-10-28 LAB — CBC
HEMATOCRIT: 37 % — AB (ref 39.0–52.0)
HEMOGLOBIN: 11 g/dL — AB (ref 13.0–17.0)
MCH: 23.5 pg — ABNORMAL LOW (ref 26.0–34.0)
MCHC: 29.7 g/dL — ABNORMAL LOW (ref 30.0–36.0)
MCV: 79.1 fL (ref 78.0–100.0)
Platelets: 175 10*3/uL (ref 150–400)
RBC: 4.68 MIL/uL (ref 4.22–5.81)
RDW: 15 % (ref 11.5–15.5)
WBC: 9.2 10*3/uL (ref 4.0–10.5)

## 2014-10-28 LAB — COMPREHENSIVE METABOLIC PANEL
ALK PHOS: 60 U/L (ref 38–126)
ALT: 17 U/L (ref 17–63)
ANION GAP: 9 (ref 5–15)
AST: 15 U/L (ref 15–41)
Albumin: 3.2 g/dL — ABNORMAL LOW (ref 3.5–5.0)
BILIRUBIN TOTAL: 1 mg/dL (ref 0.3–1.2)
BUN: <5 mg/dL — ABNORMAL LOW (ref 6–20)
CALCIUM: 8.6 mg/dL — AB (ref 8.9–10.3)
CO2: 23 mmol/L (ref 22–32)
Chloride: 111 mmol/L (ref 101–111)
Creatinine, Ser: 0.79 mg/dL (ref 0.61–1.24)
GFR calc non Af Amer: >60 mL/min (ref >60)
Glucose, Bld: 82 mg/dL (ref 65–99)
POTASSIUM: 3.2 mmol/L — AB (ref 3.5–5.1)
SODIUM: 143 mmol/L (ref 135–145)
TOTAL PROTEIN: 5.8 g/dL — AB (ref 6.5–8.1)

## 2014-10-28 LAB — HEMOGLOBIN A1C
HEMOGLOBIN A1C: 5.5 % (ref 4.8–5.6)
Mean Plasma Glucose: 111 mg/dL

## 2014-10-28 MED ORDER — SODIUM CHLORIDE 0.9 % IV SOLN
INTRAVENOUS | Status: DC
Start: 2014-10-28 — End: 2014-10-29
  Administered 2014-10-29: 11:00:00 via INTRAVENOUS

## 2014-10-28 MED ORDER — POTASSIUM CHLORIDE CRYS ER 20 MEQ PO TBCR
40.0000 meq | EXTENDED_RELEASE_TABLET | Freq: Once | ORAL | Status: AC
  Administered 2014-10-28: 40 meq via ORAL
  Filled 2014-10-28: qty 2

## 2014-10-28 MED ORDER — FLEET ENEMA 7-19 GM/118ML RE ENEM
2.0000 | ENEMA | Freq: Once | RECTAL | Status: AC
  Administered 2014-10-29: 2 via RECTAL
  Filled 2014-10-28: qty 2

## 2014-10-28 NOTE — Progress Notes (Signed)
[facility] PROGRESS NOTE  Andre [name] ZHY:[id number] DOB: [birth year] DOA: 10/26/2014 PCP: No primary care provider on file.  Assessment/Plan: Principal Problem:   Crohn's colitis -Gastroenterologist are currently on board and assisting. Patient to obtain flexible sigmoidoscopy to biopsy and send CMV PCR to evaluate for CMV colitis -We'll continue supportive therapy - c diff negative  Active Problems:   Acute hypokalemia - Resolved    Acute hyperglycemia - Elevated most likely secondary to recent prednisone use - Obtain hemoglobin A1c    Refractory nausea and vomiting - Improved continue antiemetics    Dehydration, moderate - Most likely cause of elevated lactic acid -Trending down on last check    Microcytic anemia -  Will continue to monitor hgb levels   Code Status: full Family Communication: no family at bedside Disposition Plan: pending final recommendations from GI specialist   Consultants:  GI  Procedures:  None  Antibiotics:  Metronidazole  HPI/Subjective: Pt has no new complaints. Feels about the same does not report much improvement in condition.  Objective: Filed Vitals:   10/28/14 0946  BP: 123/73  Pulse: 74  Temp: 98 F (36.7 C)  Resp: 18    Intake/Output Summary (Last 24 hours) at 10/28/14 1640 Last data filed at 10/28/14 0500  Gross per 24 hour  Intake   2933 ml  Output    450 ml  Net   2483 ml   Filed Weights   10/27/14 0524 10/28/14 0519  Weight: 75.6 kg (166 lb 10.7 oz) 77.4 kg (170 lb 10.2 oz)    Exam:   General:  Pt in NAD, alert and awake  Cardiovascular: rrr, no mrg  Respiratory: cta bl, no wheezes  Abdomen: soft, NT, ND  Musculoskeletal: no cyanosis or clubbing   Data Reviewed: Basic Metabolic Panel:  Recent Labs Lab 10/25/14 1154 10/26/14 1032 10/27/14 0447 10/28/14 0543  NA 143 144 138 143  K 3.1* 3.1* 4.1 3.2*  CL 111 110 110 111  CO2 25 21* 19* 23  GLUCOSE 97 122* 153* 82  BUN 12 13  <5* <5*  CREATININE 1.05 0.93 0.77 0.79  CALCIUM 8.8* 9.2 8.5* 8.6*   Liver Function Tests:  Recent Labs Lab 10/25/14 1154 10/27/14 0447 10/28/14 0543  AST 21 24 15   ALT 25 18 17   ALKPHOS 60 57 60  BILITOT 0.7 0.7 1.0  PROT 6.0* 6.0* 5.8*  ALBUMIN 3.5 3.2* 3.2*   No results for input(s): LIPASE, AMYLASE in the last 168 hours. No results for input(s): AMMONIA in the last 168 hours. CBC:  Recent Labs Lab 10/25/14 1154 10/26/14 1032 10/27/14 0447 10/28/14 0543  WBC 12.0* 16.1* 13.4* 9.2  NEUTROABS  --  15.0*  --   --   HGB 11.9* 11.0* 10.4* 11.0*  HCT 38.4* 35.5* 34.3* 37.0*  MCV 77.3* 77.7* 78.9 79.1  PLT 279 253 203 175   Cardiac Enzymes: No results for input(s): CKTOTAL, CKMB, CKMBINDEX, TROPONINI in the last 168 hours. BNP (last 3 results) No results for input(s): BNP in the last 8760 hours.  ProBNP (last 3 results) No results for input(s): PROBNP in the last 8760 hours.  CBG: No results for input(s): GLUCAP in the last 168 hours.  Recent Results (from the past 240 hour(s))  C difficile quick scan w PCR reflex     Status: None   Collection Time: 10/26/14  4:04 PM  Result Value Ref Range Status   C Diff antigen NEGATIVE NEGATIVE Final   C Diff toxin NEGATIVE  NEGATIVE Final   C Diff interpretation Negative for toxigenic C. difficile  Final     Studies: No results found.  Scheduled Meds: . sodium chloride   Intravenous Once  . ALPRAZolam  0.5 mg Oral BID  . azaTHIOprine  175 mg Oral Daily  . enoxaparin (LOVENOX) injection  40 mg Subcutaneous Q24H  . famotidine  20 mg Oral Daily  . gabapentin  900 mg Oral TID  . methylPREDNISolone (SOLU-MEDROL) injection  40 mg Intravenous Daily  . metronidazole  500 mg Intravenous Q8H  . nicotine  21 mg Transdermal Q24H  . potassium chloride  40 mEq Oral Once  . [START ON 10/29/2014] sodium phosphate  2 enema Rectal Once   Continuous Infusions: . 0.9 % NaCl with KCl 20 mEq / L 75 mL/hr at 10/28/14 0932     Time  spent: 10 minutes    Penny Pia  [facility] Pager [phone] If 7PM-7AM, please contact night-coverage at [email or web address], password [facility] 10/28/2014, 4:40 PM  LOS: 2 days

## 2014-10-28 NOTE — Progress Notes (Signed)
Progress Note   Subjective  Patient reports ongoing lower abdominal pain, diarrhea, and rectal bleeding. This is his third day on IV steroids and he does not think he has had any significant improvement thus far. Afebrile, WBC downtrending. I met him for the first time this morning and discussed his Crohns history further. He has had multiple admissions for flares of disease and has been on chronic steroids in the recent past. He reports admissions lasting anywhere from 2 days to a few weeks to put him back into remission. He has never had a prior CMV infection he is aware of. Overall, his condition appears unchanged since admission.    Objective   Vital signs in last 24 hours: Temp:  [97.7 F (36.5 C)-98.8 F (37.1 C)] 98 F (36.7 C) (09/06 0946) Pulse Rate:  [59-74] 74 (09/06 0946) Resp:  [16-18] 18 (09/06 0946) BP: (123-141)/(73-92) 123/73 mmHg (09/06 0946) SpO2:  [100 %] 100 % (09/06 0946) Weight:  [170 lb 10.2 oz (77.4 kg)] 170 lb 10.2 oz (77.4 kg) (09/06 0519) Last BM Date: 10/28/14 General:    white male in NAD Heart:  Regular rate and rhythm; no murmurs Lungs: Respirations even and unlabored, lungs CTA bilaterally Abdomen:  Soft, midline abdominal scant, tenderness to palpation in the lower left abdomen without rebound or guarding, and nondistended. Normal bowel sounds. Extremities:  Without edema. Neurologic:  Alert and oriented,  grossly normal neurologically. Psych:  Cooperative. Normal mood and affect.  Intake/Output from previous day: 09/05 0701 - 09/06 0700 In: 4373 [P.O.:2520; I.V.:1853] Out: 450 [Urine:450] Intake/Output this shift:    Lab Results:  Recent Labs  10/26/14 1032 10/27/14 0447 10/28/14 0543  WBC 16.1* 13.4* 9.2  HGB 11.0* 10.4* 11.0*  HCT 35.5* 34.3* 37.0*  PLT 253 203 175   BMET  Recent Labs  10/26/14 1032 10/27/14 0447 10/28/14 0543  NA 144 138 143  K 3.1* 4.1 3.2*  CL 110 110 111  CO2 21* 19* 23  GLUCOSE 122* 153* 82    BUN 13 <5* <5*  CREATININE 0.93 0.77 0.79  CALCIUM 9.2 8.5* 8.6*   LFT  Recent Labs  10/28/14 0543  PROT 5.8*  ALBUMIN 3.2*  AST 15  ALT 17  ALKPHOS 60  BILITOT 1.0   PT/INR No results for input(s): LABPROT, INR in the last 72 hours.    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 On IV steroids for over 48 hours without significant benefit in symptoms, although WBC improving. CT scan reviewed, appears to have active disease at surgical anastomosis.   Crohn's disease: Complicated history, steroid dependant in recent past, intolerant of prior anti-TNFs (including anaphylaxis to Remicade). Ultimately Vedolizumab is a good option for his disease and agree with this in the future however it will not help to put him into remission while hospitalized. Given he has not had significant improvement with IV steroids thus far, and his history of longstanding steroid use, he has risk factors for CMV and this needs to be ruled out. In this light, recommend flex sig to biopsy and send CMV PCR to evaluate for CMV colitis. He has been C Diff negative. He reports due to intolerance of conscious sedation with prior endoscopies, he will only have it done with propofol, thus we have him scheduled to have his case done with  anesthesia support tomorrow morning. In the interim, recommend continued IV solumedrol and avoidance of NSAIDs. He should continue Azathioprine. Continue clear liquid diet and NPO after midnight. He has risk factors for VTE during Crohns flare, agree with lovenox for prophylaxis. Please minimize narcotic use if possible.   VTE prophylaxis: continue lovenox.   Please call with questions, concerns, or changes in his status.    Principal Problem:   Crohn's colitis Active Problems:   Acute hypokalemia   Acute hyperglycemia    Refractory nausea and vomiting   Dehydration, moderate   Microcytic anemia   Bipolar affective disorder   Anxiety disorder   Acute Crohn's disease     LOS: 2 days   Manus Gunning, MD  10/28/2014, 1:54 PM  Pager [phone]

## 2014-10-29 ENCOUNTER — Inpatient Hospital Stay (HOSPITAL_COMMUNITY): Payer: Medicare Other | Admitting: Anesthesiology

## 2014-10-29 ENCOUNTER — Encounter (HOSPITAL_COMMUNITY): Payer: Self-pay

## 2014-10-29 ENCOUNTER — Encounter (HOSPITAL_COMMUNITY)
Admission: EM | Disposition: A | Discharge status: Home / Self Care | Payer: Self-pay | Source: Home / Self Care | Attending: Internal Medicine

## 2014-10-29 DIAGNOSIS — R7309 Other abnormal glucose: Secondary | ICD-10-CM | POA: Diagnosis not present

## 2014-10-29 DIAGNOSIS — R1030 Lower abdominal pain, unspecified: Secondary | ICD-10-CM | POA: Diagnosis not present

## 2014-10-29 DIAGNOSIS — K50111 Crohn's disease of large intestine with rectal bleeding: Secondary | ICD-10-CM | POA: Diagnosis not present

## 2014-10-29 DIAGNOSIS — K50119 Crohn's disease of large intestine with unspecified complications: Secondary | ICD-10-CM | POA: Diagnosis not present

## 2014-10-29 DIAGNOSIS — K50811 Crohn's disease of both small and large intestine with rectal bleeding: Secondary | ICD-10-CM | POA: Diagnosis not present

## 2014-10-29 DIAGNOSIS — E860 Dehydration: Secondary | ICD-10-CM | POA: Diagnosis not present

## 2014-10-29 DIAGNOSIS — E876 Hypokalemia: Secondary | ICD-10-CM | POA: Diagnosis not present

## 2014-10-29 HISTORY — PX: FLEXIBLE SIGMOIDOSCOPY: SHX5431

## 2014-10-29 SURGERY — SIGMOIDOSCOPY, FLEXIBLE
Anesthesia: Monitor Anesthesia Care

## 2014-10-29 MED ORDER — ONDANSETRON HCL 4 MG/2ML IJ SOLN
4.0000 mg | Freq: Once | INTRAMUSCULAR | Status: DC | PRN
Start: 2014-10-29 — End: 2014-10-29

## 2014-10-29 MED ORDER — HYDROCORTISONE ACETATE 25 MG RE SUPP
25.0000 mg | Freq: Two times a day (BID) | RECTAL | Status: DC
  Administered 2014-10-29 – 2014-11-02 (×7): 25 mg via RECTAL
  Filled 2014-10-29 (×8): qty 1

## 2014-10-29 MED ORDER — HYDROMORPHONE HCL 1 MG/ML IJ SOLN: 0.2500 mg | INTRAMUSCULAR | Status: DC | PRN

## 2014-10-29 MED ORDER — MEPERIDINE HCL 25 MG/ML IJ SOLN: 6.2500 mg | INTRAMUSCULAR | Status: DC | PRN

## 2014-10-29 MED ORDER — HYDROMORPHONE HCL 1 MG/ML IJ SOLN
INTRAMUSCULAR | Status: AC
  Filled 2014-10-29: qty 1

## 2014-10-29 MED ORDER — FENTANYL CITRATE (PF) 100 MCG/2ML IJ SOLN
INTRAMUSCULAR | Status: DC | PRN
  Administered 2014-10-29 (×3): 50 ug via INTRAVENOUS

## 2014-10-29 MED ORDER — MIDAZOLAM HCL 5 MG/5ML IJ SOLN
INTRAMUSCULAR | Status: DC | PRN
  Administered 2014-10-29 (×2): 1 mg via INTRAVENOUS

## 2014-10-29 MED ORDER — LACTATED RINGERS IV SOLN
INTRAVENOUS | Status: DC | PRN
  Administered 2014-10-29: 16:00:00 via INTRAVENOUS

## 2014-10-29 MED ORDER — PROPOFOL INFUSION 10 MG/ML OPTIME
INTRAVENOUS | Status: DC | PRN
  Administered 2014-10-29: 25 ug/kg/min via INTRAVENOUS

## 2014-10-29 NOTE — Anesthesia Postprocedure Evaluation (Signed)
  Anesthesia Post-op Note  Patient: Andre [name]  Procedure(s) Performed: Procedure(s): FLEXIBLE SIGMOIDOSCOPY (N/A)  Patient Location: Endoscopy Unit  Anesthesia Type:MAC  Level of Consciousness: oriented, sedated, patient cooperative and responds to stimulation  Airway and Oxygen Therapy: Patient Spontanous Breathing  Post-op Pain: none  Post-op Assessment: Post-op Vital signs reviewed, Patient's Cardiovascular Status Stable, Respiratory Function Stable, Patent Airway, No signs of Nausea or vomiting and Pain level controlled              Post-op Vital Signs: Reviewed and stable  Last Vitals:  Filed Vitals:   10/29/14 1626  BP: 99/47  Pulse:   Temp:   Resp: 14    Complications: No apparent anesthesia complications

## 2014-10-29 NOTE — Transfer of Care (Signed)
Immediate Anesthesia Transfer of Care Note  Patient: Andre [name]  Procedure(s) Performed: Procedure(s): FLEXIBLE SIGMOIDOSCOPY (N/A)  Patient Location: Endoscopy Unit  Anesthesia Type:MAC  Level of Consciousness: awake, sedated and patient cooperative  Airway & Oxygen Therapy: Patient Spontanous Breathing and Patient connected to nasal cannula oxygen  Post-op Assessment: Report given to RN, Post -op Vital signs reviewed and stable and Patient moving all extremities  Post vital signs: Reviewed and stable  Last Vitals:  Filed Vitals:   10/29/14 1328  BP: 142/84  Pulse: 56  Temp: 36.9 C  Resp: 10    Complications: No apparent anesthesia complications

## 2014-10-29 NOTE — Interval H&P Note (Signed)
History and Physical Interval Note:  10/29/2014 1:46 PM  Andre [name]  has presented today for surgery, with the diagnosis of crohn's, bloody diarrhea  The various methods of treatment have been discussed with the patient and family. After consideration of risks, benefits and other options for treatment, the patient has consented to  Procedure(s): FLEXIBLE SIGMOIDOSCOPY (N/A) as a surgical intervention .  The patient's history has been reviewed, patient examined, no change in status, stable for surgery.  I have reviewed the patient's chart and labs.  Questions were answered to the patient's satisfaction.     Reeves Forth Jaziah Goeller

## 2014-10-29 NOTE — Progress Notes (Addendum)
[facility] PROGRESS NOTE  Andre [name] GUR:[id number] DOB: [birth year] DOA: 10/26/2014 PCP: No primary care provider on file.  Brief Summary  29 year old male with Crohn's disease diagnosed at age of 33.  Over his lifetime, he was undergone multiple colonic and small bowel resections resulting with an endpoint of basically a subtotal colectomy.  He presented with 36 hours of abdominal pain, diarrhea, and stool incontinence.  He has multiple drug allergies including to Remicade and and Humira.  He was seen in the ER Saturday 9/3 with increased frequency of stools and increasing left lower quadrant abdominal pain with nausea. CT that time revealed trace free fluid around his ileo-colonic anastomosis well as a few loops of dilated small bowel. His symptoms improved after anti-medics and pain medication. He was offered admission but felt as if he could manage with outpatient oral antibiotics he was given a dose of IV site Medrol in the ER and discharged from the ER with prednisone 40 mg twice a day. He returned to the ER today with worsening abdominal pain nausea and vomiting and diarrhea. He again reports averaging one stool in hour which at baseline he typically only has 3 stools per day. He always has a trace amount of blood in his stools he describes as "spotting". He thinks the pain medication may be contributing to his nausea. Chronically patient has not experienced any weight loss. His physicians are in the [facility] area. For most flares he typically responds to IV steroids with Cipro and Flagyl.  Despite several days of IV antibiotics and steroids, he continued to have frequent loose stools with incontinence.  9/7:  Flex sig for eval for CMV colitis  Assessment/Plan  Crohn's colitis, diarrhea not responding to steroids and antibiotics and continuation of his azathioprine  - flexible sigmoidoscopy to biopsy and send CMV PCR to evaluate for CMV colitis -continue steroids and  antibiotics per GI recommendations - c diff negative - appreciate GI assistance -  ESR 16  Acute hypokalemia -  No labs today -  Will repeat in AM  Hyperglycemia - Elevated most likely secondary to recent prednisone use - Hemoglobin A1c 5.5  Refractory nausea and vomiting - Improved continue antiemetics  Dehydration, moderate - Most likely cause of elevated lactic acid -  improving  Microcytic anemia, hemoglobin approximately stable.   -  Likely has component of iron deficiency from chronic blood loss AND anemia of inflammation from crohns.  Will not check labs at this time due to acute flare but will need close follow up.  Diet:  NPO Access:  PIV IVF:  yes Proph:  lovenox  Code Status: full Family Communication: Patient alone Disposition Plan: Flexible sigmoidoscopy today, home once diarrhea improving   Consultants:  Gastroenterology  Procedures:  Flexible sigmoidoscopy on 9/7  Antibiotics:  Flagyl started 9/4   HPI/Subjective:  Patient states that he has not noticed any improvement in his abdominal pain in the left lower quadrant or in his diarrhea which continues to be watery, frequent with stool incontinence  Objective: Filed Vitals:   10/28/14 2136 10/29/14 0451 10/29/14 0500 10/29/14 1328  BP: 118/69 127/85  142/84  Pulse: 51 65  56  Temp: 98.7 F (37.1 C) 97.7 F (36.5 C)  98.4 F (36.9 C)  TempSrc: Oral Oral  Oral  Resp: 16 16  10   Height:    5' 2"  (1.575 m)  Weight:   73.7 kg (162 lb 7.7 oz) 73.483 kg (162 lb)  SpO2: 100% 94%  100%  Intake/Output Summary (Last 24 hours) at 10/29/14 1453 Last data filed at 10/29/14 0800  Gross per 24 hour  Intake   3485 ml  Output      0 ml  Net   3485 ml   Filed Weights   10/28/14 0519 10/29/14 0500 10/29/14 1328  Weight: 77.4 kg (170 lb 10.2 oz) 73.7 kg (162 lb 7.7 oz) 73.483 kg (162 lb)   Body mass index is 29.62 kg/(m^2).  Exam:   General:  Overweight adult male, No acute  distress  HEENT:  NCAT, MMM  Cardiovascular:  RRR, nl S1, S2 no mrg, 2+ pulses, warm extremities  Respiratory:  CTAB, no increased WOB  Abdomen:   NABS, soft, nondistended, tender to palpation in the left peri-umbilical area, left lower quadrant and slightly to the left of the suprapubic area  MSK:   Normal tone and bulk, no LEE  Neuro:  Grossly intact  Data Reviewed: Basic Metabolic Panel:  Recent Labs Lab 10/25/14 1154 10/26/14 1032 10/27/14 0447 10/28/14 0543  NA 143 144 138 143  K 3.1* 3.1* 4.1 3.2*  CL 111 110 110 111  CO2 25 21* 19* 23  GLUCOSE 97 122* 153* 82  BUN 12 13 <5* <5*  CREATININE 1.05 0.93 0.77 0.79  CALCIUM 8.8* 9.2 8.5* 8.6*   Liver Function Tests:  Recent Labs Lab 10/25/14 1154 10/27/14 0447 10/28/14 0543  AST 21 24 15   ALT 25 18 17   ALKPHOS 60 57 60  BILITOT 0.7 0.7 1.0  PROT 6.0* 6.0* 5.8*  ALBUMIN 3.5 3.2* 3.2*   No results for input(s): LIPASE, AMYLASE in the last 168 hours. No results for input(s): AMMONIA in the last 168 hours. CBC:  Recent Labs Lab 10/25/14 1154 10/26/14 1032 10/27/14 0447 10/28/14 0543  WBC 12.0* 16.1* 13.4* 9.2  NEUTROABS  --  15.0*  --   --   HGB 11.9* 11.0* 10.4* 11.0*  HCT 38.4* 35.5* 34.3* 37.0*  MCV 77.3* 77.7* 78.9 79.1  PLT 279 253 203 175    Recent Results (from the past 240 hour(s))  C difficile quick scan w PCR reflex     Status: None   Collection Time: 10/26/14  4:04 PM  Result Value Ref Range Status   C Diff antigen NEGATIVE NEGATIVE Final   C Diff toxin NEGATIVE NEGATIVE Final   C Diff interpretation Negative for toxigenic C. difficile  Final     Studies: No results found.  Scheduled Meds: . [MAR Hold] sodium chloride   Intravenous Once  . [MAR Hold] ALPRAZolam  0.5 mg Oral BID  . [MAR Hold] azaTHIOprine  175 mg Oral Daily  . [MAR Hold] enoxaparin (LOVENOX) injection  40 mg Subcutaneous Q24H  . [MAR Hold] famotidine  20 mg Oral Daily  . [MAR Hold] gabapentin  900 mg Oral TID   . [MAR Hold] methylPREDNISolone (SOLU-MEDROL) injection  40 mg Intravenous Daily  . [MAR Hold] metronidazole  500 mg Intravenous Q8H  . [MAR Hold] nicotine  21 mg Transdermal Q24H   Continuous Infusions: . sodium chloride 20 mL/hr at 10/29/14 1104  . 0.9 % NaCl with KCl 20 mEq / L 75 mL/hr (10/29/14 0122)    Principal Problem:   Crohn's colitis Active Problems:   Acute hypokalemia   Acute hyperglycemia   Refractory nausea and vomiting   Dehydration, moderate   Microcytic anemia   Bipolar affective disorder   Anxiety disorder   Acute Crohn's disease    Time spent: 30 min  Andre [name]  [facility] Pager [phone]. If 7PM-7AM, please contact night-coverage at [email or web address], password [facility] 10/29/2014, 2:53 PM  LOS: 3 days

## 2014-10-29 NOTE — H&P (View-Only) (Signed)
Progress Note   Subjective  Patient reports ongoing lower abdominal pain, diarrhea, and rectal bleeding. This is his third day on IV steroids and he does not think he has had any significant improvement thus far. Afebrile, WBC downtrending. I met him for the first time this morning and discussed his Crohns history further. He has had multiple admissions for flares of disease and has been on chronic steroids in the recent past. He reports admissions lasting anywhere from 2 days to a few weeks to put him back into remission. He has never had a prior CMV infection he is aware of. Overall, his condition appears unchanged since admission.    Objective   Vital signs in last 24 hours: Temp:  [97.7 F (36.5 C)-98.8 F (37.1 C)] 98 F (36.7 C) (09/06 0946) Pulse Rate:  [59-74] 74 (09/06 0946) Resp:  [16-18] 18 (09/06 0946) BP: (123-141)/(73-92) 123/73 mmHg (09/06 0946) SpO2:  [100 %] 100 % (09/06 0946) Weight:  [170 lb 10.2 oz (77.4 kg)] 170 lb 10.2 oz (77.4 kg) (09/06 0519) Last BM Date: 10/28/14 General:    white male in NAD Heart:  Regular rate and rhythm; no murmurs Lungs: Respirations even and unlabored, lungs CTA bilaterally Abdomen:  Soft, midline abdominal scant, tenderness to palpation in the lower left abdomen without rebound or guarding, and nondistended. Normal bowel sounds. Extremities:  Without edema. Neurologic:  Alert and oriented,  grossly normal neurologically. Psych:  Cooperative. Normal mood and affect.  Intake/Output from previous day: 09/05 0701 - 09/06 0700 In: 4373 [P.O.:2520; I.V.:1853] Out: 450 [Urine:450] Intake/Output this shift:    Lab Results:  Recent Labs  10/26/14 1032 10/27/14 0447 10/28/14 0543  WBC 16.1* 13.4* 9.2  HGB 11.0* 10.4* 11.0*  HCT 35.5* 34.3* 37.0*  PLT 253 203 175   BMET  Recent Labs  10/26/14 1032 10/27/14 0447 10/28/14 0543  NA 144 138 143  K 3.1* 4.1 3.2*  CL 110 110 111  CO2 21* 19* 23  GLUCOSE 122* 153* 82    BUN 13 <5* <5*  CREATININE 0.93 0.77 0.79  CALCIUM 9.2 8.5* 8.6*   LFT  Recent Labs  10/28/14 0543  PROT 5.8*  ALBUMIN 3.2*  AST 15  ALT 17  ALKPHOS 60  BILITOT 1.0   PT/INR No results for input(s): LABPROT, INR in the last 72 hours.    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 On IV steroids for over 48 hours without significant benefit in symptoms, although WBC improving. CT scan reviewed, appears to have active disease at surgical anastomosis.   Crohn's disease: Complicated history, steroid dependant in recent past, intolerant of prior anti-TNFs (including anaphylaxis to Remicade). Ultimately Vedolizumab is a good option for his disease and agree with this in the future however it will not help to put him into remission while hospitalized. Given he has not had significant improvement with IV steroids thus far, and his history of longstanding steroid use, he has risk factors for CMV and this needs to be ruled out. In this light, recommend flex sig to biopsy and send CMV PCR to evaluate for CMV colitis. He has been C Diff negative. He reports due to intolerance of conscious sedation with prior endoscopies, he will only have it done with propofol, thus we have him scheduled to have his case done with  anesthesia support tomorrow morning. In the interim, recommend continued IV solumedrol and avoidance of NSAIDs. He should continue Azathioprine. Continue clear liquid diet and NPO after midnight. He has risk factors for VTE during Crohns flare, agree with lovenox for prophylaxis. Please minimize narcotic use if possible.   VTE prophylaxis: continue lovenox.   Please call with questions, concerns, or changes in his status.    Principal Problem:   Crohn's colitis Active Problems:   Acute hypokalemia   Acute hyperglycemia    Refractory nausea and vomiting   Dehydration, moderate   Microcytic anemia   Bipolar affective disorder   Anxiety disorder   Acute Crohn's disease     LOS: 2 days   Manus Gunning, MD  10/28/2014, 1:54 PM  Pager [phone]

## 2014-10-29 NOTE — Anesthesia Preprocedure Evaluation (Addendum)
Anesthesia Evaluation  Patient identified by MRN, date of birth, ID band Patient awake    Reviewed: Allergy & Precautions, NPO status , Patient's Chart, lab work & pertinent test results  Airway Mallampati: I  TM Distance: >3 FB Neck ROM: Full    Dental  (+) Missing, Poor Dentition   Pulmonary Current Smoker,    Pulmonary exam normal        Cardiovascular Normal cardiovascular exam     Neuro/Psych Anxiety Depression Bipolar Disorder    GI/Hepatic Crohn's Disease    Endo/Other  On chronic prednisone  Renal/GU      Musculoskeletal   Abdominal   Peds  Hematology   Anesthesia Other Findings   Reproductive/Obstetrics                            Anesthesia Physical Anesthesia Plan  ASA: III  Anesthesia Plan: MAC   Post-op Pain Management:    Induction: Intravenous  Airway Management Planned: Simple Face Mask  Additional Equipment:   Intra-op Plan:   Post-operative Plan:   Informed Consent: I have reviewed the patients History and Physical, chart, labs and discussed the procedure including the risks, benefits and alternatives for the proposed anesthesia with the patient or authorized representative who has indicated his/her understanding and acceptance.     Plan Discussed with: CRNA and Surgeon  Anesthesia Plan Comments:         Anesthesia Quick Evaluation

## 2014-10-29 NOTE — Progress Notes (Signed)
Received patient from endo, alert and oriented, not in any distress, VSS. Will continue to monitor.

## 2014-10-29 NOTE — Anesthesia Postprocedure Evaluation (Signed)
  Anesthesia Post-op Note  Patient: Andre [name]  Procedure(s) Performed: Procedure(s): FLEXIBLE SIGMOIDOSCOPY (N/A)  Patient Location: Endoscopy Unit  Anesthesia Type:MAC  Level of Consciousness: awake, sedated and patient cooperative  Airway and Oxygen Therapy: Patient Spontanous Breathing and Patient connected to nasal cannula oxygen  Post-op Pain: none  Post-op Assessment: Post-op Vital signs reviewed, Patient's Cardiovascular Status Stable, Respiratory Function Stable, Patent Airway and No signs of Nausea or vomiting              Post-op Vital Signs: Reviewed and stable  Last Vitals:  Filed Vitals:   10/29/14 1626  BP: 99/47  Pulse:   Temp:   Resp: 14    Complications: No apparent anesthesia complications

## 2014-10-29 NOTE — Care Management Important Message (Signed)
Important Message  Patient Details  Name: Andre [name] MRN: [id number] Date of Birth: 02/10/1986   Medicare Important Message Given:  Yes-second notification given    Orson Aloe 10/29/2014, 12:58 PM

## 2014-10-29 NOTE — Op Note (Signed)
[facility] [place], [address]   FLEXIBLE SIGMOIDOSCOPY PROCEDURE REPORT  PATIENT: Andre [name], Andre [name]  MR#: [id number] BIRTHDATE: 09-Apr-1985 , 28  yrs. old GENDER: male ENDOSCOPIST: Rutherford Cellar, MD REFERRED BY: PROCEDURE DATE:  10/29/2014 PROCEDURE:   Sigmoidoscopy with biopsy ASA CLASS:   Class III INDICATIONS: Crohns patient with multiple prior resections presenting with worsening abdominal pain, diarrhea, and rectal bleeding. Not improving on IV steroids to date. On Azathioprine. CT shows suspected inflammatory changes at surgical anastomosis. MEDICATIONS: Per Anesthesia  DESCRIPTION OF PROCEDURE:   After the risks benefits and alternatives of the procedure were thoroughly explained, informed consent was obtained.  Digital exam revealed stenosis of the anal canal. The     endoscope was introduced through the anus  and advanced to the surgical anastomosis , The exam was Without limitations.    The quality of the prep was The overall prep quality was adequate. . Estimated blood loss is zero unless otherwise noted in this procedure report. The instrument was then slowly withdrawn as the mucosa was fully examined.         COLON FINDINGS: There was a stenosis of the anal canal appreciated, although examination finger and colonoscope was able traverse it without difficulty.  There was an area of ulceration in the very distal rectum / proximal anal canal at the stenosis, however the remainder of the examined colon was normal.  There was roughly 25cm of colon / rectum present until the anastomosis to the small bowel was noted.  The anastomosis appeared healthy, perhaps with mild narrowing but widely patent without inflammatory changes.  Biopsies were taken of the anastomosis and ulcerated rectal area. Otherwise, the small bowel was intubated roughly 20cm and appeared normal without any inflammatory changes.  Retroflexion was  not performed given small size of the rectum and good views obtained an anterograde position.    Retroflexion was not performed due to a narrow rectal vault.    The scope was then withdrawn from the patient and the procedure terminated.  COMPLICATIONS: There were no immediate complications.  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 I suspect this finding is causing the patient's rectal bleeding. Normal appearing colon otherwise, with healthy appearing surgical anastomosis, and healthy appearing small bowel. Do not suspect CMV. Overall, no findings on flex sig to explain the patient's severe abdominal pain. If Crohns is causing his abdominal pain, suspect more proximal small bowel involvement, not seen on this exam.  RECOMMENDATIONS: Return to medical ward Resume diet Await biopsy results Obtain ESR / CRP, repeat CBC Continue medications. Consider Canasa suppository or hydrocortizone suppository for anal canal findings. Consider repeat cross sectional imaging / enterography study given no signficant pathology on this exam to account for the patient's severe abdominal pain.  If Crohns is causing his discomfort, suspect more proximal small bowel disease not seen on this exam.     eSigned:  Kittitas Cellar, MD 10/29/2014 4:35 PM   CC:  PATIENT NAME:  Andre [name], Andre [name] MR#: [id number]

## 2014-10-30 ENCOUNTER — Encounter (HOSPITAL_COMMUNITY): Payer: Self-pay | Admitting: Gastroenterology

## 2014-10-30 DIAGNOSIS — E876 Hypokalemia: Secondary | ICD-10-CM | POA: Diagnosis not present

## 2014-10-30 DIAGNOSIS — K50811 Crohn's disease of both small and large intestine with rectal bleeding: Secondary | ICD-10-CM | POA: Diagnosis not present

## 2014-10-30 DIAGNOSIS — K50111 Crohn's disease of large intestine with rectal bleeding: Secondary | ICD-10-CM | POA: Diagnosis not present

## 2014-10-30 DIAGNOSIS — K50919 Crohn's disease, unspecified, with unspecified complications: Secondary | ICD-10-CM | POA: Diagnosis not present

## 2014-10-30 DIAGNOSIS — D509 Iron deficiency anemia, unspecified: Secondary | ICD-10-CM | POA: Diagnosis not present

## 2014-10-30 DIAGNOSIS — R1030 Lower abdominal pain, unspecified: Secondary | ICD-10-CM | POA: Diagnosis not present

## 2014-10-30 LAB — CBC
HEMATOCRIT: 36.1 % — AB (ref 39.0–52.0)
Hemoglobin: 11 g/dL — ABNORMAL LOW (ref 13.0–17.0)
MCH: 23.8 pg — ABNORMAL LOW (ref 26.0–34.0)
MCHC: 30.5 g/dL (ref 30.0–36.0)
MCV: 78.1 fL (ref 78.0–100.0)
Platelets: 175 10*3/uL (ref 150–400)
RBC: 4.62 MIL/uL (ref 4.22–5.81)
RDW: 14.7 % (ref 11.5–15.5)
WBC: 12.2 10*3/uL — AB (ref 4.0–10.5)

## 2014-10-30 LAB — IRON AND TIBC
IRON: 31 ug/dL — AB (ref 45–182)
Saturation Ratios: 10 % — ABNORMAL LOW (ref 17.9–39.5)
TIBC: 314 ug/dL (ref 250–450)
UIBC: 283 ug/dL

## 2014-10-30 LAB — SEDIMENTATION RATE: SED RATE: 6 mm/h (ref 0–16)

## 2014-10-30 LAB — FOLATE: Folate: 11.2 ng/mL (ref >5.9)

## 2014-10-30 LAB — C-REACTIVE PROTEIN

## 2014-10-30 LAB — VITAMIN B12: VITAMIN B 12: 296 pg/mL (ref 180–914)

## 2014-10-30 LAB — FERRITIN: FERRITIN: 17 ng/mL — AB (ref 24–336)

## 2014-10-30 MED ORDER — SODIUM CHLORIDE 0.9 % IV SOLN
510.0000 mg | Freq: Once | INTRAVENOUS | Status: AC
  Administered 2014-10-30: 510 mg via INTRAVENOUS
  Filled 2014-10-30: qty 17

## 2014-10-30 NOTE — Progress Notes (Signed)
Progress Note   Subjective  Patient underwent flex sig yesterday, results as below. Overnight he continues to feel the same. He has fairly severe ongoing LLQ abdominal pain. He is tolerating clear liquid diet without vomiting or obstructive symptoms, and has an appetite. No fevers   Objective   Vital signs in last 24 hours: Temp:  [98 F (36.7 C)-98.6 F (37 C)] 98 F (36.7 C) (09/08 0554) Pulse Rate:  [56-76] 59 (09/08 0554) Resp:  [10-18] 18 (09/08 0554) BP: (98-142)/(47-84) 119/72 mmHg (09/08 0554) SpO2:  [97 %-100 %] 100 % (09/08 0554) Weight:  [161 lb 9.6 oz (73.3 kg)-162 lb (73.483 kg)] 161 lb 9.6 oz (73.3 kg) (09/08 0500) Last BM Date: 10/29/14 General:    white male in NAD Heart:  Regular rate and rhythm; no murmurs Lungs: Respirations even and unlabored, lungs CTA bilaterally Abdomen:  Soft, large midline scar, LLQ abdominal TTP without rebound or guarding. Normal bowel sounds. Extremities:  Without edema. Neurologic:  Alert and oriented,  grossly normal neurologically. Psych:  Cooperative. Normal mood and affect.  Intake/Output from previous day: 09/07 0701 - 09/08 0700 In: 0349 [P.O.:1040; I.V.:275] Out: 5 [Urine:4; Stool:1] Intake/Output this shift:    Lab Results:  Recent Labs  10/28/14 0543 10/30/14 0355  WBC 9.2 12.2*  HGB 11.0* 11.0*  HCT 37.0* 36.1*  PLT 175 175   BMET  Recent Labs  10/28/14 0543  NA 143  K 3.2*  CL 111  CO2 23  GLUCOSE 82  BUN <5*  CREATININE 0.79  CALCIUM 8.6*   LFT  Recent Labs  10/28/14 0543  PROT 5.8*  ALBUMIN 3.2*  AST 15  ALT 17  ALKPHOS 60  BILITOT 1.0   CRP 1, C Diff negative  Prior CT scan abdomen reviewed    Assessment / Plan:   29 y/o male with longstanding Crohns disease, small and large intestine involvement, who has been intolerant to or failed 3 prior anti-TNF agents. On Azathioprine monotherapy with poorly controlled disease, with recent months of steroid use. Multiple prior  surgeries, the last about a year ago, with colon resection, roughly 20-25cm of colon remaining.  The patient has been admitted for a suspected Crohns flare with abdominal pain and diarrhea, with rectal bleeding. On admission his CT abdomen showed some thickening and inflammatory changes at the surgical anastomosis and some dilated loops of small bowel, with some thickening of more proximal jejunal limbs. No evidence of perforating disease or abscess on initial CT. He has been on IV solumedrol and flagyl without significant improvement to date. His WBC has remained stable. ESR on admission was normal. Flex sig done yesterday remarkable for relatively normal appearing colon and surgical anastomosis with normal small bowel examined. He did have a anal canal stricture (mild) with some inflammatory changes in the anal canal, likely which is causing his rectal bleeding but not his abdominal pain. Biopsies obtained.   Overnight CRP obtained which is only 1, and ESR repeat pending. Iron levels are deficient. While CRP is normal, not all Crohns patients have elevations in CRP with flares. If he is having a Crohns flare suspect he has more proximal small bowel disease, although it is concerning he has had no response to 4 days of IV steroids. Given his severe abdominal pain recommend interval imaging to assess for more proximal Crohns activity and ensure no complications of Crohns. He has had numerous CT scans in the past, and in this light will try to  obtain MR abdomen / enterography if possible. Otherwise continue his present regimen for now. If imaging suggests ongoing active Crohns and he fails to improve with steroids, will need to consider bowel rest, increasing steroid dose, or other less frequently used therapies such as cyclosporine given his failure/intolerance of anti-TNF agents in the past. I will also attempt to discuss his case with his primary Gastroenterologist whom he sees at [facility]. Entyvio has no role  for acute Crohns therapy in this setting, and do not think Stelera would acutely improve him either.  Recommend: -continue steroids for now -await pending labs, biopsy results -imaging today, MR preferable -will attempt to discuss his case with his primary Gastroenterologist -full liquid diet if tolerated -lovenox for DVT prophylaxis -stool studies for ova/parasite -iron supplementation as outpatient when able to tolerate it   Principal Problem:   Crohn's colitis Active Problems:   Acute hypokalemia   Acute hyperglycemia   Refractory nausea and vomiting   Dehydration, moderate   Microcytic anemia   Bipolar affective disorder   Anxiety disorder   Acute Crohn's disease     LOS: 4 days   Manus Gunning, MD  10/30/2014, 7:51 AM  [facility] Pager [phone]

## 2014-10-30 NOTE — Progress Notes (Signed)
[facility] PROGRESS NOTE  Andre [name] LKT:[id number] DOB: 01-14-1986 DOA: 10/26/2014 PCP: No primary care provider on file.  Brief Summary  29 year old male with Crohn's disease diagnosed at age of 22.  Over his lifetime, he was undergone multiple colonic and small bowel resections resulting with an endpoint of basically a subtotal colectomy.  He presented with 36 hours of abdominal pain, diarrhea, and stool incontinence.  He has multiple drug allergies including to Remicade and and Humira.  He was seen in the ER Saturday 9/3 with increased frequency of stools and increasing left lower quadrant abdominal pain with nausea. CT that time revealed trace free fluid around his ileo-colonic anastomosis well as a few loops of dilated small bowel. His symptoms improved after anti-medics and pain medication. He was offered admission but felt as if he could manage with outpatient oral antibiotics he was given a dose of IV site Medrol in the ER and discharged from the ER with prednisone 40 mg twice a day. He returned to the ER today with worsening abdominal pain nausea and vomiting and diarrhea. He again reports averaging one stool in hour which at baseline he typically only has 3 stools per day. He always has a trace amount of blood in his stools he describes as "spotting". He thinks the pain medication may be contributing to his nausea. Chronically patient has not experienced any weight loss. His physicians are in the [facility] area. For most flares he typically responds to IV steroids with Cipro and Flagyl.  Despite several days of IV antibiotics and steroids, he continued to have frequent loose stools with incontinence.  9/7:  Flex sig for eval for CMV colitis  Assessment/Plan  Crohn's colitis, diarrhea not responding to steroids and antibiotics and continuation of his azathioprine  - flexible sigmoidoscopy to biopsy and send CMV PCR to evaluate for CMV colitis:  No signs of CMV or active  disease -  Await pathology report  -  continue steroids and antibiotics - c diff negative - appreciate GI assistance -  ESR 16 -  MR Enterography pending  Acute hypokalemia -  No labs today -  Will repeat in AM  Hyperglycemia, elevated secondary to steroids - Hemoglobin A1c 5.5  Refractory nausea and vomiting - Improved continue antiemetics  Dehydration, moderate - Most likely cause of elevated lactic acid -  improving  Microcytic anemia, hemoglobin approximately stable.   -  Likely has component of iron deficiency from chronic blood loss AND anemia of inflammation from crohns.  Will not check labs at this time due to acute flare but will need close follow up.  Diet:  CLD Access:  PIV IVF:  yes Proph:  lovenox  Code Status: full Family Communication: Patient alone Disposition Plan: Home once diarrhea improving.  MR enterography pending   Consultants:  Gastroenterology  Procedures:  Flexible sigmoidoscopy on 9/7  Antibiotics:  Flagyl started 9/4   HPI/Subjective:  Patient states that he has not noticed any improvement in his abdominal pain in the left lower quadrant or in his diarrhea which continues to be watery, frequent with stool incontinence.  Same as yesterday.  Tolerating CLD  Objective: Filed Vitals:   10/29/14 1722 10/29/14 2218 10/30/14 0500 10/30/14 0554  BP: 138/80 112/67  119/72  Pulse: 74 76  59  Temp: 98.6 F (37 C) 98.4 F (36.9 C)  98 F (36.7 C)  TempSrc: Axillary Oral  Oral  Resp: _0 Height:      Weight:  73.3 kg (161 lb 9.6 oz)   SpO2: 100% 97%  100%    Intake/Output Summary (Last 24 hours) at 10/30/14 1137 Last data filed at 10/30/14 0940  Gross per 24 hour  Intake   1480 ml  Output      6 ml  Net   1474 ml   Filed Weights   10/29/14 0500 10/29/14 1328 10/30/14 0500  Weight: 73.7 kg (162 lb 7.7 oz) 73.483 kg (162 lb) 73.3 kg (161 lb 9.6 oz)   Body mass index is 29.55 kg/(m^2).  Exam:   General:   Overweight adult male, No acute distress, lying in darkened room  HEENT:  NCAT, MMM  Cardiovascular:  RRR, nl S1, S2 no mrg, 2+ pulses, warm extremities  Respiratory:  CTAB, no increased WOB  Abdomen:   NABS, soft, nondistended, tender to palpation in the left peri-umbilical area, left lower quadrant and slightly to the left of the suprapubic area  MSK:   Normal tone and bulk, no LEE  Neuro:  Grossly intact  Data Reviewed: Basic Metabolic Panel:  Recent Labs Lab 10/25/14 1154 10/26/14 1032 10/27/14 0447 10/28/14 0543  NA 143 144 138 143  K 3.1* 3.1* 4.1 3.2*  CL 111 110 110 111  CO2 25 21* 19* 23  GLUCOSE 97 122* 153* 82  BUN 12 13 <5* <5*  CREATININE 1.05 0.93 0.77 0.79  CALCIUM 8.8* 9.2 8.5* 8.6*   Liver Function Tests:  Recent Labs Lab 10/25/14 1154 10/27/14 0447 10/28/14 0543  AST _0 ALT _1 ALKPHOS 60 57 60  BILITOT 0.7 0.7 1.0  PROT 6.0* 6.0* 5.8*  ALBUMIN 3.5 3.2* 3.2*   No results for input(s): LIPASE, AMYLASE in the last 168 hours. No results for input(s): AMMONIA in the last 168 hours. CBC:  Recent Labs Lab 10/25/14 1154 10/26/14 1032 10/27/14 0447 10/28/14 0543 10/30/14 0355  WBC 12.0* 16.1* 13.4* 9.2 12.2*  NEUTROABS  --  15.0*  --   --   --   HGB 11.9* 11.0* 10.4* 11.0* 11.0*  HCT 38.4* 35.5* 34.3* 37.0* 36.1*  MCV 77.3* 77.7* 78.9 79.1 78.1  PLT 279 253 203 175 175    Recent Results (from the past 240 hour(s))  C difficile quick scan w PCR reflex     Status: None   Collection Time: 10/26/14  4:04 PM  Result Value Ref Range Status   C Diff antigen NEGATIVE NEGATIVE Final   C Diff toxin NEGATIVE NEGATIVE Final   C Diff interpretation Negative for toxigenic C. difficile  Final     Studies: No results found.  Scheduled Meds: . ALPRAZolam  0.5 mg Oral BID  . azaTHIOprine  175 mg Oral Daily  . enoxaparin (LOVENOX) injection  40 mg Subcutaneous Q24H  . famotidine  20 mg Oral Daily  . gabapentin  900 mg Oral TID  .  hydrocortisone  25 mg Rectal BID  . methylPREDNISolone (SOLU-MEDROL) injection  40 mg Intravenous Daily  . metronidazole  500 mg Intravenous Q8H  . nicotine  21 mg Transdermal Q24H   Continuous Infusions:    Principal Problem:   Crohn's colitis Active Problems:   Acute hypokalemia   Acute hyperglycemia   Refractory nausea and vomiting   Dehydration, moderate   Microcytic anemia   Bipolar affective disorder   Anxiety disorder   Acute Crohn's disease    Time spent: 30 min    Britt Theard, [facility] Pager [phone]. If 7PM-7AM,  please contact night-coverage at [email or web address], password [facility] 10/30/2014, 11:37 AM  LOS: 4 days

## 2014-10-31 ENCOUNTER — Inpatient Hospital Stay (HOSPITAL_COMMUNITY): Payer: Medicare Other

## 2014-10-31 DIAGNOSIS — R112 Nausea with vomiting, unspecified: Secondary | ICD-10-CM

## 2014-10-31 DIAGNOSIS — E876 Hypokalemia: Secondary | ICD-10-CM | POA: Diagnosis not present

## 2014-10-31 DIAGNOSIS — R1030 Lower abdominal pain, unspecified: Secondary | ICD-10-CM | POA: Diagnosis not present

## 2014-10-31 DIAGNOSIS — K50811 Crohn's disease of both small and large intestine with rectal bleeding: Secondary | ICD-10-CM | POA: Diagnosis not present

## 2014-10-31 DIAGNOSIS — K50919 Crohn's disease, unspecified, with unspecified complications: Secondary | ICD-10-CM | POA: Diagnosis not present

## 2014-10-31 DIAGNOSIS — K50111 Crohn's disease of large intestine with rectal bleeding: Secondary | ICD-10-CM | POA: Diagnosis not present

## 2014-10-31 LAB — BASIC METABOLIC PANEL
Anion gap: 9 (ref 5–15)
BUN: <5 mg/dL — ABNORMAL LOW (ref 6–20)
CHLORIDE: 104 mmol/L (ref 101–111)
CO2: 27 mmol/L (ref 22–32)
CREATININE: 0.8 mg/dL (ref 0.61–1.24)
Calcium: 8.1 mg/dL — ABNORMAL LOW (ref 8.9–10.3)
Glucose, Bld: 126 mg/dL — ABNORMAL HIGH (ref 65–99)
POTASSIUM: 3 mmol/L — AB (ref 3.5–5.1)
SODIUM: 140 mmol/L (ref 135–145)

## 2014-10-31 LAB — OVA AND PARASITE EXAMINATION: Ova and parasites: NONE SEEN

## 2014-10-31 MED ORDER — BARIUM SULFATE 2.1 % PO SUSP
ORAL | Status: AC
  Filled 2014-10-31: qty 3

## 2014-10-31 MED ORDER — KCL IN DEXTROSE-NACL 40-5-0.45 MEQ/L-%-% IV SOLN
INTRAVENOUS | Status: DC
  Administered 2014-10-31 – 2014-11-02 (×4): via INTRAVENOUS
  Filled 2014-10-31 (×6): qty 1000

## 2014-10-31 MED ORDER — POTASSIUM CHLORIDE 10 MEQ/100ML IV SOLN
10.0000 meq | INTRAVENOUS | Status: DC
  Administered 2014-10-31: 10 meq via INTRAVENOUS
  Filled 2014-10-31: qty 100

## 2014-10-31 MED ORDER — GADOBENATE DIMEGLUMINE 529 MG/ML IV SOLN
16.0000 mL | Freq: Once | INTRAVENOUS | Status: AC | PRN
  Administered 2014-10-31: 16 mL via INTRAVENOUS

## 2014-10-31 NOTE — Progress Notes (Signed)
Progress Note   Subjective  Patient continues to report ongoing severe abdominal pain and diarrhea. No change since admission per his report. Tolerating PO without vomiting. Went to MRI this AM and his IV blew, going to attempt it again once IV established. Afebrile.    Objective   Vital signs in last 24 hours: Temp:  [98 F (36.7 C)-98.4 F (36.9 C)] 98 F (36.7 C) (09/09 0538) Pulse Rate:  [60-78] 60 (09/09 0538) Resp:  [16-18] 17 (09/09 0538) BP: (110-128)/(62-78) 128/62 mmHg (09/09 0538) SpO2:  [99 %-100 %] 100 % (09/09 0538) Weight:  [159 lb 2.8 oz (72.2 kg)] 159 lb 2.8 oz (72.2 kg) (09/09 0538) Last BM Date: 10/31/14 General:    white male in NAD Heart:  Regular rate and rhythm; no murmurs Lungs: Respirations even and unlabored, lungs CTA bilaterally Abdomen:  Soft, large midline scar, focal tenderness to palpation in LLQ without rebound or guarding and nondistended. Normal bowel sounds. Extremities:  Without edema. Neurologic:  Alert and oriented,  grossly normal neurologically. Psych:  Cooperative. Normal mood and affect.  Intake/Output from previous day: 09/08 0701 - 09/09 0700 In: 1340 [P.O.:1140; IV Piggyback:200] Out: 22 [Urine:11; Stool:11] Intake/Output this shift:    Lab Results:  Recent Labs  10/30/14 0355  WBC 12.2*  HGB 11.0*  HCT 36.1*  PLT 175   BMET  Recent Labs  10/31/14 0415  NA 140  K 3.0*  CL 104  CO2 27  GLUCOSE 126*  BUN <5*  CREATININE 0.80  CALCIUM 8.1*   LFT No results for input(s): PROT, ALBUMIN, AST, ALT, ALKPHOS, BILITOT, BILIDIR, IBILI in the last 72 hours. PT/INR No results for input(s): LABPROT, INR in the last 72 hours.  Studies/Results: No results found.     Assessment / Plan:   29 y/o male with longstanding Crohns disease, small and large intestine involvement, who has been intolerant to or failed 3 prior anti-TNF agents. On Azathioprine monotherapy with poorly controlled disease, with recent months of  steroid use. Multiple prior surgeries, the last about a year ago, with colon resection, roughly 20-25cm of colon remaining.  The patient has been admitted for a suspected Crohns flare with abdominal pain and diarrhea, with rectal bleeding. On admission his CT abdomen showed some thickening and inflammatory changes at the surgical anastomosis and some dilated loops of small bowel, with some thickening of more proximal jejunal limbs. No evidence of perforating disease or abscess on initial CT. He has been on IV solumedrol and flagyl without significant improvement to date, he essentially reports being the same since admission. His CRP and ESR are normal, with mild leukocytosis and anemia. Flex sig done remarkable for relatively normal appearing colon and surgical anastomosis with normal small bowel examined. He did have a anal canal stricture (mild) with some inflammatory changes in the anal canal, likely which is causing his rectal bleeding but not his abdominal pain. Biopsies obtained.   At this time he reports this presentation is consistent with his index Crohns symptoms, but given flex sig and normal inflammatory markers, we need to prove he is having a Crohns flare, and suspect he has more proximal small bowel disease if this is the case. His lack of improvement with steroids is concerning thus far if he has active Crohns, as options to put him into remission are limited if he fails IV steroids given intolerance to anti-TNFs in the past (options include bowel rest / TPN, cyclosporine). His IV blew during  MRI attempt this AM, will re-attempt to image again today and await results to assess for active Crohns or complications from Crohns. Further recs pending MRE result.   Recommend: -continue steroids for now -await pending labs, biopsy results. Would send GI pathogen panel to exclude rare infectious causes given lack of improvement to steroids thus far -MR enterography -full liquid diet if  tolerated -lovenox for DVT prophylaxis -iron supplementation as outpatient when able to tolerate it  [place] Cellar, MD [facility] Pager [phone]   Principal Problem:   Crohn's colitis Active Problems:   Acute hypokalemia   Acute hyperglycemia   Refractory nausea and vomiting   Dehydration, moderate   Microcytic anemia   Bipolar affective disorder   Anxiety disorder   Acute Crohn's disease     LOS: 5 days   Renelda Loma Kaynen Minner  10/31/2014, 11:35 AM

## 2014-10-31 NOTE — Progress Notes (Addendum)
[facility] PROGRESS NOTE  Bakari Nikolai UKG:[id number] DOB: [birth year] DOA: 10/26/2014 PCP: No primary care provider on file.  Brief Summary  29 year old male with Crohn's disease diagnosed at age of 89.  Over his lifetime, he was undergone multiple colonic and small bowel resections resulting with an endpoint of basically a subtotal colectomy.  He presented with 36 hours of abdominal pain, diarrhea, and stool incontinence.  He has multiple drug allergies including to Remicade and and Humira.  He was seen in the ER Saturday 9/3 with increased frequency of stools and increasing left lower quadrant abdominal pain with nausea. CT that time revealed trace free fluid around his ileo-colonic anastomosis well as a few loops of dilated small bowel. His symptoms improved after anti-medics and pain medication. He was offered admission but felt as if he could manage with outpatient oral antibiotics he was given a dose of IV site Medrol in the ER and discharged from the ER with prednisone 40 mg twice a day. He returned to the ER today with worsening abdominal pain nausea and vomiting and diarrhea. He again reports averaging one stool in hour which at baseline he typically only has 3 stools per day. He always has a trace amount of blood in his stools he describes as "spotting". He thinks the pain medication may be contributing to his nausea. Chronically patient has not experienced any weight loss. His physicians are in the [facility] area. For most flares he typically responds to IV steroids with Cipro and Flagyl.  Despite several days of IV antibiotics and steroids, he continued to have frequent loose stools with incontinence.  9/7:  Flex sig for eval for CMV colitis  Assessment/Plan  Crohn's colitis, diarrhea not responding to steroids and antibiotics and continuation of his azathioprine  - flexible sigmoidoscopy to biopsy and send CMV PCR to evaluate for CMV colitis:  No signs of CMV or active  disease -  Await pathology report and CMV PCR -  continue steroids and antibiotics - c diff negative - appreciate GI assistance -  ESR 16 -  MR Enterography pending, unable to tolerate this morning, but will try again this afternoon  Acute hypokalemia -  No labs today -  Will repeat in AM  Hyperglycemia, elevated secondary to steroids - Hemoglobin A1c 5.5  Refractory nausea and vomiting - Improved continue antiemetics  Dehydration, moderate - Most likely cause of elevated lactic acid -  improving  Microcytic anemia, hemoglobin approximately stable.   -  Likely has component of iron deficiency from chronic blood loss AND anemia of inflammation from crohns.  Will not check labs at this time due to acute flare but will need close follow up.  Hypokalemia, start IVF with potassium  Diet:  CLD Access:  PIV IVF:  yes Proph:  lovenox  Code Status: full Family Communication: Patient alone Disposition Plan: Home once diarrhea improving.  MR enterography pending   Consultants:  Gastroenterology  Procedures:  Flexible sigmoidoscopy on 9/7  Antibiotics:  Flagyl started 9/4   HPI/Subjective:  Still having severe pain and requiring frequent dilaudid.  Vomited during enterography  Objective: Filed Vitals:   10/30/14 0554 10/30/14 1435 10/30/14 2128 10/31/14 0538  BP: 119/72 110/67 119/78 128/62  Pulse: 59 78  60  Temp: 98 F (36.7 C) 98.3 F (36.8 C) 98.4 F (36.9 C) 98 F (36.7 C)  TempSrc: Oral Oral Oral Oral  Resp: 18 16 18 17   Height:      Weight:    72.2  kg (159 lb 2.8 oz)  SpO2: 100% 100% 99% 100%    Intake/Output Summary (Last 24 hours) at 10/31/14 1526 Last data filed at 10/31/14 0540  Gross per 24 hour  Intake    540 ml  Output     15 ml  Net    525 ml   Filed Weights   10/29/14 1328 10/30/14 0500 10/31/14 0538  Weight: 73.483 kg (162 lb) 73.3 kg (161 lb 9.6 oz) 72.2 kg (159 lb 2.8 oz)   Body mass index is 29.11  kg/(m^2).  Exam:   General:  Overweight adult male, No acute distress, lying in darkened room  HEENT:  NCAT, MMM  Cardiovascular:  RRR, nl S1, S2 no mrg, 2+ pulses, warm extremities  Respiratory:  CTAB, no increased WOB  Abdomen:   NABS, soft, nondistended, minimally tender to palpation in the left peri-umbilical area, left lower quadrant and slightly to the left of the suprapubic area, no rebound or guarding  MSK:   Normal tone and bulk, no LEE  Neuro:  Grossly intact  Data Reviewed: Basic Metabolic Panel:  Recent Labs Lab 10/25/14 1154 10/26/14 1032 10/27/14 0447 10/28/14 0543 10/31/14 0415  NA 143 144 138 143 140  K 3.1* 3.1* 4.1 3.2* 3.0*  CL 111 110 110 111 104  CO2 25 21* 19* 23 27  GLUCOSE 97 122* 153* 82 126*  BUN 12 13 <5* <5* <5*  CREATININE 1.05 0.93 0.77 0.79 0.80  CALCIUM 8.8* 9.2 8.5* 8.6* 8.1*   Liver Function Tests:  Recent Labs Lab 10/25/14 1154 10/27/14 0447 10/28/14 0543  AST 21 24 15   ALT 25 18 17   ALKPHOS 60 57 60  BILITOT 0.7 0.7 1.0  PROT 6.0* 6.0* 5.8*  ALBUMIN 3.5 3.2* 3.2*   No results for input(s): LIPASE, AMYLASE in the last 168 hours. No results for input(s): AMMONIA in the last 168 hours. CBC:  Recent Labs Lab 10/25/14 1154 10/26/14 1032 10/27/14 0447 10/28/14 0543 10/30/14 0355  WBC 12.0* 16.1* 13.4* 9.2 12.2*  NEUTROABS  --  15.0*  --   --   --   HGB 11.9* 11.0* 10.4* 11.0* 11.0*  HCT 38.4* 35.5* 34.3* 37.0* 36.1*  MCV 77.3* 77.7* 78.9 79.1 78.1  PLT 279 253 203 175 175    Recent Results (from the past 240 hour(s))  C difficile quick scan w PCR reflex     Status: None   Collection Time: 10/26/14  4:04 PM  Result Value Ref Range Status   C Diff antigen NEGATIVE NEGATIVE Final   C Diff toxin NEGATIVE NEGATIVE Final   C Diff interpretation Negative for toxigenic C. difficile  Final     Studies: No results found.  Scheduled Meds: . ALPRAZolam  0.5 mg Oral BID  . azaTHIOprine  175 mg Oral Daily  . Barium  Sulfate      . enoxaparin (LOVENOX) injection  40 mg Subcutaneous Q24H  . famotidine  20 mg Oral Daily  . gabapentin  900 mg Oral TID  . hydrocortisone  25 mg Rectal BID  . methylPREDNISolone (SOLU-MEDROL) injection  40 mg Intravenous Daily  . metronidazole  500 mg Intravenous Q8H  . nicotine  21 mg Transdermal Q24H   Continuous Infusions: . dextrose 5 % and 0.45 % NaCl with KCl 40 mEq/L      Principal Problem:   Crohn's colitis Active Problems:   Acute hypokalemia   Acute hyperglycemia   Refractory nausea and vomiting   Dehydration, moderate  Microcytic anemia   Bipolar affective disorder   Anxiety disorder   Acute Crohn's disease    Time spent: 30 min    Anelis Hrivnak, [facility] Pager [phone]. If 7PM-7AM, please contact night-coverage at [email or web address], password [facility] 10/31/2014, 3:26 PM  LOS: 5 days

## 2014-10-31 NOTE — Progress Notes (Signed)
Attempted to administer IV potassium as ordered. Patient complaining of burning. Had IV restarted and decreased potassium infusion rate multiple times for patient comfort. Patient states that "it still burns" and that he refuses to have anymore of the medication. Dr. Malachi Bonds notified.

## 2014-10-31 NOTE — Progress Notes (Addendum)
Patient c/o pain at IV site when radiology tried to instil IV contrast for abdominal MRI this morning. Also unable to tolerate oral contrast and had an episode of emesis. Per radiology, he is refusing to continue MRI study at this time. Returned to room. IV team has been consulted for a new IV. Dr. Malachi Bonds and Dr. Adela Lank both paged.

## 2014-11-01 DIAGNOSIS — K50118 Crohn's disease of large intestine with other complication: Secondary | ICD-10-CM | POA: Diagnosis not present

## 2014-11-01 DIAGNOSIS — E876 Hypokalemia: Secondary | ICD-10-CM | POA: Diagnosis not present

## 2014-11-01 DIAGNOSIS — D509 Iron deficiency anemia, unspecified: Secondary | ICD-10-CM | POA: Diagnosis not present

## 2014-11-01 DIAGNOSIS — K50811 Crohn's disease of both small and large intestine with rectal bleeding: Secondary | ICD-10-CM | POA: Diagnosis not present

## 2014-11-01 DIAGNOSIS — K50918 Crohn's disease, unspecified, with other complication: Secondary | ICD-10-CM | POA: Diagnosis not present

## 2014-11-01 DIAGNOSIS — R1030 Lower abdominal pain, unspecified: Secondary | ICD-10-CM | POA: Diagnosis not present

## 2014-11-01 MED ORDER — HYDROMORPHONE HCL 1 MG/ML IJ SOLN
1.0000 mg | INTRAMUSCULAR | Status: DC | PRN
  Administered 2014-11-01 (×3): 2 mg via INTRAVENOUS
  Administered 2014-11-01: 1 mg via INTRAVENOUS
  Administered 2014-11-01 – 2014-11-02 (×4): 2 mg via INTRAVENOUS
  Filled 2014-11-01 (×5): qty 2
  Filled 2014-11-01: qty 1
  Filled 2014-11-01 (×2): qty 2

## 2014-11-01 MED ORDER — OXYCODONE HCL 5 MG PO TABS
15.0000 mg | ORAL_TABLET | ORAL | Status: DC | PRN
  Administered 2014-11-01: 15 mg via ORAL
  Filled 2014-11-01 (×2): qty 3

## 2014-11-01 MED ORDER — LOPERAMIDE HCL 2 MG PO CAPS
2.0000 mg | ORAL_CAPSULE | Freq: Two times a day (BID) | ORAL | Status: DC
  Administered 2014-11-01 – 2014-11-02 (×3): 2 mg via ORAL
  Filled 2014-11-01 (×3): qty 1

## 2014-11-01 NOTE — Progress Notes (Addendum)
[facility] PROGRESS NOTE  Andre [name] WJX:[id number] DOB: Oct 14, 1985 DOA: 10/26/2014 PCP: No primary care provider on file.  Brief Summary  29 year old male with Crohn's disease diagnosed at age of 89.  Over his lifetime, he was undergone multiple colonic and small bowel resections resulting with an endpoint of basically a subtotal colectomy.  He presented with 36 hours of abdominal pain, diarrhea, and stool incontinence.  He has multiple drug allergies including to Remicade and and Humira.  He was seen in the ER Saturday 9/3 with increased frequency of stools and increasing left lower quadrant abdominal pain with nausea. CT that time revealed trace free fluid around his ileo-colonic anastomosis well as a few loops of dilated small bowel. His symptoms improved after anti-medics and pain medication. He was offered admission but felt as if he could manage with outpatient oral antibiotics he was given a dose of IV site Medrol in the ER and discharged from the ER with prednisone 40 mg twice a day. He returned to the ER today with worsening abdominal pain nausea and vomiting and diarrhea. He again reports averaging one stool in hour which at baseline he typically only has 3 stools per day. He always has a trace amount of blood in his stools he describes as "spotting". He thinks the pain medication may be contributing to his nausea. Chronically patient has not experienced any weight loss. His physicians are in the [facility] area. For most flares he typically responds to IV steroids with Cipro and Flagyl.  Despite several days of IV antibiotics and steroids, he continued to have frequent loose stools with incontinence.  9/7:  Flex sig for eval for CMV colitis 9/9:  MR enterography with small bowel inflammatino  Assessment/Plan  Crohn's colitis, diarrhea.  C. Diff negative. Responding slowly to steroids and antibiotics and continuation of his azathioprine  - flexible sigmoidoscopy to biopsy  and send CMV PCR to evaluate for CMV colitis:  No signs of CMV or active disease -  Await pathology report and CMV PCR -  continue steroids and antibiotics per GI -  ESR 16 -  MR Enterography c/w small bowel inflammation -  Advance diet -  Start transitioning to oral pain medications  Acute hypokalemia -  No labs today -  Will repeat in AM  Hyperglycemia, elevated secondary to steroids - Hemoglobin A1c 5.5  Refractory nausea and vomiting - Improved continue antiemetics  Dehydration, moderate - Most likely cause of elevated lactic acid -  improving  Microcytic anemia, hemoglobin approximately stable.   -  Likely has component of iron deficiency from chronic blood loss AND anemia of inflammation from crohns.  Will not check labs at this time due to acute flare but will need close follow up.  Hypokalemia, continue IVF with potassium  Diet:  CLD Access:  PIV IVF:  yes Proph:  Refusing lovenox so given SCDs instead  Code Status: full Family Communication: Patient alone Disposition Plan: Home once diarrhea improved, tolerating PO medications and food   Consultants:  Gastroenterology  Procedures:  Flexible sigmoidoscopy on 9/7  Antibiotics:  Flagyl started 9/4   HPI/Subjective:  Less severe pain and stools are less frequent but he did not have stool incontinence overnight.  Requiring frequent dilaudid.   Objective: Filed Vitals:   10/31/14 2334 11/01/14 0419 11/01/14 0809 11/01/14 1337  BP: 98/60 117/55 116/74 98/51  Pulse: 64 72 68 80  Temp: 97.6 F (36.4 C) 97.9 F (36.6 C) 97.8 F (36.6 C) 98.4 F (36.9  C)  TempSrc: Oral Oral Oral Oral  Resp: 17 18 19 18   Height:      Weight:  71.8 kg (158 lb 4.6 oz)    SpO2: 100% 100% 100% 100%    Intake/Output Summary (Last 24 hours) at 11/01/14 1650 Last data filed at 11/01/14 0903  Gross per 24 hour  Intake 2231.67 ml  Output      7 ml  Net 2224.67 ml   Filed Weights   10/30/14 0500 11/02/14 0538 11/01/14  0419  Weight: 73.3 kg (161 lb 9.6 oz) 72.2 kg (159 lb 2.8 oz) 71.8 kg (158 lb 4.6 oz)   Body mass index is 28.94 kg/(m^2).  Exam:   General:  Overweight adult male, No acute distress, lights on in room today  HEENT:  NCAT, MMM  Cardiovascular:  RRR, nl S1, S2 no mrg, 2+ pulses, warm extremities  Respiratory:  CTAB, no increased WOB  Abdomen:   NABS, soft, nondistended, minimally tender to palpation in the left peri-umbilical area, left lower quadrant and slightly to the left of the suprapubic area, no rebound or guarding  MSK:   Normal tone and bulk, no LEE  Neuro:  Grossly intact  Data Reviewed: Basic Metabolic Panel:  Recent Labs Lab 10/26/14 1032 10/27/14 0447 10/28/14 0543 Nov 02, 2014 0415  NA 144 138 143 140  K 3.1* 4.1 3.2* 3.0*  CL 110 110 111 104  CO2 21* 19* 23 27  GLUCOSE 122* 153* 82 126*  BUN 13 <5* <5* <5*  CREATININE 0.93 0.77 0.79 0.80  CALCIUM 9.2 8.5* 8.6* 8.1*   Liver Function Tests:  Recent Labs Lab 10/27/14 0447 10/28/14 0543  AST 24 15  ALT 18 17  ALKPHOS 57 60  BILITOT 0.7 1.0  PROT 6.0* 5.8*  ALBUMIN 3.2* 3.2*   No results for input(s): LIPASE, AMYLASE in the last 168 hours. No results for input(s): AMMONIA in the last 168 hours. CBC:  Recent Labs Lab 10/26/14 1032 10/27/14 0447 10/28/14 0543 10/30/14 0355  WBC 16.1* 13.4* 9.2 12.2*  NEUTROABS 15.0*  --   --   --   HGB 11.0* 10.4* 11.0* 11.0*  HCT 35.5* 34.3* 37.0* 36.1*  MCV 77.7* 78.9 79.1 78.1  PLT 253 203 175 175    Recent Results (from the past 240 hour(s))  C difficile quick scan w PCR reflex     Status: None   Collection Time: 10/26/14  4:04 PM  Result Value Ref Range Status   C Diff antigen NEGATIVE NEGATIVE Final   C Diff toxin NEGATIVE NEGATIVE Final   C Diff interpretation Negative for toxigenic C. difficile  Final  Ova and parasite examination     Status: None   Collection Time: 10/30/14  3:15 PM  Result Value Ref Range Status   Specimen Description  PERIRECTAL  Final   Special Requests NONE  Final   Ova and parasites   Final    NO OVA OR PARASITES SEEN Performed at [facility]    Report Status 11/02/2014 FINAL  Final     Studies: Mr Consuela Mimes W/o W/cm  2014-11-02   CLINICAL DATA:  History of Crohn's disease  EXAM: MR ABDOMEN AND PELVIS WITHOUT AND WITH CONTRAST (MR ENTEROGRAPHY)  TECHNIQUE: Multiplanar, multisequence MRI of the abdomen and pelvis was performed both before and during bolus administration of intravenous contrast. Negative oral contrast VoLumen was given.  CONTRAST:  52m MULTIHANCE GADOBENATE DIMEGLUMINE 529 MG/ML IV SOLN  COMPARISON:  10/25/2014  FINDINGS: MR  ABDOMEN FINDINGS  Hepatobiliary: No focal liver abnormality identified. The gallbladder is normal. There is no biliary dilatation.  Pancreas: Normal appearance of the pancreas.  Spleen: Negative  Adrenals/Urinary Tract: The adrenal glands are normal. Normal appearance of the kidneys. No focal kidney abnormality identified.  Stomach/Bowel: The stomach appears within normal limits. Mild increased caliber of the small bowel loops. The proximal jejunal loops measure up to 2.9 cm. The previously noted proximal small bowel loop with wall thickening is no longer visualized. The dilated distal small bowel loops near the enterocolonic anastomosis have improved currently measuring up to 3.4 cm versus 5.5 cm before. There is stable to improved inflammation in the ileocolic mesenterary adjacent to the enterocolonic anastomosis. Resolution of previous right lower quadrant free fluid. No fluid collections identified.  Vascular/Lymphatic: The abdominal aorta appears normal. No enlarged upper abdominal lymph nodes.  Other: No fluid collections or free fluid identified.  Musculoskeletal: No abnormal signal identified throughout the bone marrow.  MR PELVIS FINDINGS  Urinary Tract: The urinary bladder is unremarkable.  Bowel: The rectum appears normal. No wall thickening or inflammation  involving the rectum identified.  Vascular/Lymphatic: Iliac vessels are within normal limits. No pelvic or inguinal adenopathy noted.  Reproductive: Prostate gland and seminal vesicles appear normal.  Other: No free fluid or fluid collections identified within the pelvis.  Musculoskeletal: No abnormal signal identified within the bone marrow.  IMPRESSION: 1. Persistent but improved small bowel dilatation. 2. Ileocolic mesenteric inflammation is stable to improved in the interval. Free fluid in the right lower quadrant has resolved in the interval. At this time there is no evidence for abscess formation or penetrating disease. 3. No new findings identified.   Electronically Signed   By: Kerby Moors M.D.   On: 10/31/2014 19:58   Mr Kerry [place] Pelvis W/o W/cm  10/31/2014   CLINICAL DATA:  History of Crohn's disease  EXAM: MR ABDOMEN AND PELVIS WITHOUT AND WITH CONTRAST (MR ENTEROGRAPHY)  TECHNIQUE: Multiplanar, multisequence MRI of the abdomen and pelvis was performed both before and during bolus administration of intravenous contrast. Negative oral contrast VoLumen was given.  CONTRAST:  51m MULTIHANCE GADOBENATE DIMEGLUMINE 529 MG/ML IV SOLN  COMPARISON:  10/25/2014  FINDINGS: MR ABDOMEN FINDINGS  Hepatobiliary: No focal liver abnormality identified. The gallbladder is normal. There is no biliary dilatation.  Pancreas: Normal appearance of the pancreas.  Spleen: Negative  Adrenals/Urinary Tract: The adrenal glands are normal. Normal appearance of the kidneys. No focal kidney abnormality identified.  Stomach/Bowel: The stomach appears within normal limits. Mild increased caliber of the small bowel loops. The proximal jejunal loops measure up to 2.9 cm. The previously noted proximal small bowel loop with wall thickening is no longer visualized. The dilated distal small bowel loops near the enterocolonic anastomosis have improved currently measuring up to 3.4 cm versus 5.5 cm before. There is stable to improved  inflammation in the ileocolic mesenterary adjacent to the enterocolonic anastomosis. Resolution of previous right lower quadrant free fluid. No fluid collections identified.  Vascular/Lymphatic: The abdominal aorta appears normal. No enlarged upper abdominal lymph nodes.  Other: No fluid collections or free fluid identified.  Musculoskeletal: No abnormal signal identified throughout the bone marrow.  MR PELVIS FINDINGS  Urinary Tract: The urinary bladder is unremarkable.  Bowel: The rectum appears normal. No wall thickening or inflammation involving the rectum identified.  Vascular/Lymphatic: Iliac vessels are within normal limits. No pelvic or inguinal adenopathy noted.  Reproductive: Prostate gland and seminal vesicles appear normal.  Other: No  free fluid or fluid collections identified within the pelvis.  Musculoskeletal: No abnormal signal identified within the bone marrow.  IMPRESSION: 1. Persistent but improved small bowel dilatation. 2. Ileocolic mesenteric inflammation is stable to improved in the interval. Free fluid in the right lower quadrant has resolved in the interval. At this time there is no evidence for abscess formation or penetrating disease. 3. No new findings identified.   Electronically Signed   By: Kerby Moors M.D.   On: 10/31/2014 19:58    Scheduled Meds: . ALPRAZolam  0.5 mg Oral BID  . azaTHIOprine  175 mg Oral Daily  . enoxaparin (LOVENOX) injection  40 mg Subcutaneous Q24H  . famotidine  20 mg Oral Daily  . gabapentin  900 mg Oral TID  . hydrocortisone  25 mg Rectal BID  . loperamide  2 mg Oral BID  . methylPREDNISolone (SOLU-MEDROL) injection  40 mg Intravenous Daily  . metronidazole  500 mg Intravenous Q8H  . nicotine  21 mg Transdermal Q24H   Continuous Infusions: . dextrose 5 % and 0.45 % NaCl with KCl 40 mEq/L 100 mL/hr at 11/01/14 1534    Principal Problem:   Crohn's colitis Active Problems:   Acute hypokalemia   Acute hyperglycemia   Refractory nausea and  vomiting   Dehydration, moderate   Microcytic anemia   Bipolar affective disorder   Anxiety disorder   Acute Crohn's disease    Time spent: 30 min    Elzia Hott, [place] [facility] Pager [phone]. If 7PM-7AM, please contact night-coverage at [email or web address], password [facility] 11/01/2014, 4:50 PM  LOS: 6 days

## 2014-11-01 NOTE — Progress Notes (Signed)
Pt has been refusing his lovenox, MD notified

## 2014-11-01 NOTE — Progress Notes (Signed)
[facility] Progress Note    Since last GI note: Overall feels "slightly better" than when he came in.  He is hungry, wants to try solid food. Still with significant diarrhea. Pathology from flex sig 2-3 days ago still not back yet. MRI yesterday shows some improvement in peri-anastomotic inflammation and small bowel dilation (see full report below)  Objective: Vital signs in last 24 hours: Temp:  [97.6 F (36.4 C)-97.9 F (36.6 C)] 97.9 F (36.6 C) (09/10 0419) Pulse Rate:  [64-72] 72 (09/10 0419) Resp:  [16-18] 18 (09/10 0419) BP: (98-118)/(55-75) 117/55 mmHg (09/10 0419) SpO2:  [100 %] 100 % (09/10 0419) Weight:  [158 lb 4.6 oz (71.8 kg)] 158 lb 4.6 oz (71.8 kg) (09/10 0419) Last BM Date: 10/31/14 General: alert and oriented times 3 Heart: regular rate and rythm Abdomen: soft, mildly tender throughout, non-distended, normal bowel sounds   Lab Results:  Recent Labs  10/30/14 0355  WBC 12.2*  HGB 11.0*  PLT 175  MCV 78.1    Recent Labs  10/31/14 0415  NA 140  K 3.0*  CL 104  CO2 27  GLUCOSE 126*  BUN <5*  CREATININE 0.80  CALCIUM 8.1*    Studies/Results: Mr Adora Fridge W/o W/cm  10/31/2014   CLINICAL DATA:  History of Crohn's disease  EXAM: MR ABDOMEN AND PELVIS WITHOUT AND WITH CONTRAST (MR ENTEROGRAPHY)  TECHNIQUE: Multiplanar, multisequence MRI of the abdomen and pelvis was performed both before and during bolus administration of intravenous contrast. Negative oral contrast VoLumen was given.  CONTRAST:  16mL MULTIHANCE GADOBENATE DIMEGLUMINE 529 MG/ML IV SOLN  COMPARISON:  10/25/2014  FINDINGS: MR ABDOMEN FINDINGS  Hepatobiliary: No focal liver abnormality identified. The gallbladder is normal. There is no biliary dilatation.  Pancreas: Normal appearance of the pancreas.  Spleen: Negative  Adrenals/Urinary Tract: The adrenal glands are normal. Normal appearance of the kidneys. No focal kidney abnormality identified.  Stomach/Bowel: The stomach appears  within normal limits. Mild increased caliber of the small bowel loops. The proximal jejunal loops measure up to 2.9 cm. The previously noted proximal small bowel loop with wall thickening is no longer visualized. The dilated distal small bowel loops near the enterocolonic anastomosis have improved currently measuring up to 3.4 cm versus 5.5 cm before. There is stable to improved inflammation in the ileocolic mesenterary adjacent to the enterocolonic anastomosis. Resolution of previous right lower quadrant free fluid. No fluid collections identified.  Vascular/Lymphatic: The abdominal aorta appears normal. No enlarged upper abdominal lymph nodes.  Other: No fluid collections or free fluid identified.  Musculoskeletal: No abnormal signal identified throughout the bone marrow.  MR PELVIS FINDINGS  Urinary Tract: The urinary bladder is unremarkable.  Bowel: The rectum appears normal. No wall thickening or inflammation involving the rectum identified.  Vascular/Lymphatic: Iliac vessels are within normal limits. No pelvic or inguinal adenopathy noted.  Reproductive: Prostate gland and seminal vesicles appear normal.  Other: No free fluid or fluid collections identified within the pelvis.  Musculoskeletal: No abnormal signal identified within the bone marrow.  IMPRESSION: 1. Persistent but improved small bowel dilatation. 2. Ileocolic mesenteric inflammation is stable to improved in the interval. Free fluid in the right lower quadrant has resolved in the interval. At this time there is no evidence for abscess formation or penetrating disease. 3. No new findings identified.   Electronically Signed   By: Signa Kell M.D.   On: 10/31/2014 19:58   Mr [name] Pelvis W/o W/cm  10/31/2014   CLINICAL DATA:  History of Crohn's disease  EXAM: MR ABDOMEN AND PELVIS WITHOUT AND WITH CONTRAST (MR ENTEROGRAPHY)  TECHNIQUE: Multiplanar, multisequence MRI of the abdomen and pelvis was performed both before and during bolus  administration of intravenous contrast. Negative oral contrast VoLumen was given.  CONTRAST:  88mL MULTIHANCE GADOBENATE DIMEGLUMINE 529 MG/ML IV SOLN  COMPARISON:  10/25/2014  FINDINGS: MR ABDOMEN FINDINGS  Hepatobiliary: No focal liver abnormality identified. The gallbladder is normal. There is no biliary dilatation.  Pancreas: Normal appearance of the pancreas.  Spleen: Negative  Adrenals/Urinary Tract: The adrenal glands are normal. Normal appearance of the kidneys. No focal kidney abnormality identified.  Stomach/Bowel: The stomach appears within normal limits. Mild increased caliber of the small bowel loops. The proximal jejunal loops measure up to 2.9 cm. The previously noted proximal small bowel loop with wall thickening is no longer visualized. The dilated distal small bowel loops near the enterocolonic anastomosis have improved currently measuring up to 3.4 cm versus 5.5 cm before. There is stable to improved inflammation in the ileocolic mesenterary adjacent to the enterocolonic anastomosis. Resolution of previous right lower quadrant free fluid. No fluid collections identified.  Vascular/Lymphatic: The abdominal aorta appears normal. No enlarged upper abdominal lymph nodes.  Other: No fluid collections or free fluid identified.  Musculoskeletal: No abnormal signal identified throughout the bone marrow.  MR PELVIS FINDINGS  Urinary Tract: The urinary bladder is unremarkable.  Bowel: The rectum appears normal. No wall thickening or inflammation involving the rectum identified.  Vascular/Lymphatic: Iliac vessels are within normal limits. No pelvic or inguinal adenopathy noted.  Reproductive: Prostate gland and seminal vesicles appear normal.  Other: No free fluid or fluid collections identified within the pelvis.  Musculoskeletal: No abnormal signal identified within the bone marrow.  IMPRESSION: 1. Persistent but improved small bowel dilatation. 2. Ileocolic mesenteric inflammation is stable to improved in  the interval. Free fluid in the right lower quadrant has resolved in the interval. At this time there is no evidence for abscess formation or penetrating disease. 3. No new findings identified.   Electronically Signed   By: Signa Kell M.D.   On: 10/31/2014 19:58     Medications: Scheduled Meds: . ALPRAZolam  0.5 mg Oral BID  . azaTHIOprine  175 mg Oral Daily  . enoxaparin (LOVENOX) injection  40 mg Subcutaneous Q24H  . famotidine  20 mg Oral Daily  . gabapentin  900 mg Oral TID  . hydrocortisone  25 mg Rectal BID  . methylPREDNISolone (SOLU-MEDROL) injection  40 mg Intravenous Daily  . metronidazole  500 mg Intravenous Q8H  . nicotine  21 mg Transdermal Q24H   Continuous Infusions: . dextrose 5 % and 0.45 % NaCl with KCl 40 mEq/L 100 mL/hr at 11/01/14 0423   PRN Meds:.HYDROmorphone (DILAUDID) injection, loperamide, ondansetron (ZOFRAN) IV, oxyCODONE, promethazine    Assessment/Plan: 29 y.o. male with long standing crohn's, s/p multiple surgeries  He does feel that he is "slightly better" than when he came in. He also explained that these flares sometimes take 7-10 days to resolve, improve. For now will continue on current meds (IV steroids, azathiaprine).  I will add twice daily scheduled imodium to see if that will help a bit.  He wants to try solid food and knows that if his pains worsen he will back down to full liquids.   Rachael Fee, MD  11/01/2014, 7:59 AM [facility] Pager [phone]

## 2014-11-01 NOTE — Progress Notes (Signed)
Notified by lab tech that patient refused morning lab draw. Nurse educated patient on the importance of physician order lab draws, he still refused. Cindee Salt, RN

## 2014-11-02 DIAGNOSIS — D509 Iron deficiency anemia, unspecified: Secondary | ICD-10-CM | POA: Diagnosis not present

## 2014-11-02 DIAGNOSIS — R7309 Other abnormal glucose: Secondary | ICD-10-CM | POA: Diagnosis not present

## 2014-11-02 DIAGNOSIS — K50119 Crohn's disease of large intestine with unspecified complications: Secondary | ICD-10-CM | POA: Diagnosis not present

## 2014-11-02 DIAGNOSIS — K50811 Crohn's disease of both small and large intestine with rectal bleeding: Secondary | ICD-10-CM | POA: Diagnosis not present

## 2014-11-02 DIAGNOSIS — E876 Hypokalemia: Secondary | ICD-10-CM | POA: Diagnosis not present

## 2014-11-02 DIAGNOSIS — K50918 Crohn's disease, unspecified, with other complication: Secondary | ICD-10-CM | POA: Diagnosis not present

## 2014-11-02 DIAGNOSIS — K50118 Crohn's disease of large intestine with other complication: Secondary | ICD-10-CM | POA: Diagnosis not present

## 2014-11-02 DIAGNOSIS — R1030 Lower abdominal pain, unspecified: Secondary | ICD-10-CM | POA: Diagnosis not present

## 2014-11-02 LAB — BASIC METABOLIC PANEL
Anion gap: 8 (ref 5–15)
BUN: 9 mg/dL (ref 6–20)
CHLORIDE: 112 mmol/L — AB (ref 101–111)
CO2: 20 mmol/L — ABNORMAL LOW (ref 22–32)
CREATININE: 0.86 mg/dL (ref 0.61–1.24)
Calcium: 8.4 mg/dL — ABNORMAL LOW (ref 8.9–10.3)
GFR calc Af Amer: >60 mL/min (ref >60)
GFR calc non Af Amer: >60 mL/min (ref >60)
GLUCOSE: 146 mg/dL — AB (ref 65–99)
Potassium: 3.2 mmol/L — ABNORMAL LOW (ref 3.5–5.1)
SODIUM: 140 mmol/L (ref 135–145)

## 2014-11-02 MED ORDER — ONDANSETRON 4 MG PO TBDP: 4.0000 mg | ORAL_TABLET | Freq: Three times a day (TID) | ORAL | Status: DC | PRN

## 2014-11-02 MED ORDER — METRONIDAZOLE 500 MG PO TABS: 500.0000 mg | ORAL_TABLET | Freq: Three times a day (TID) | ORAL | Status: DC

## 2014-11-02 MED ORDER — HYDROCORTISONE ACETATE 25 MG RE SUPP: 25.0000 mg | Freq: Two times a day (BID) | RECTAL | Status: DC | PRN

## 2014-11-02 MED ORDER — OXYCODONE-ACETAMINOPHEN 5-325 MG PO TABS: 1.0000 | ORAL_TABLET | ORAL | Status: DC | PRN

## 2014-11-02 MED ORDER — PANTOPRAZOLE SODIUM 40 MG IV SOLR
40.0000 mg | Freq: Every day | INTRAVENOUS | Status: DC
  Administered 2014-11-02: 40 mg via INTRAVENOUS
  Filled 2014-11-02: qty 40

## 2014-11-02 MED ORDER — PREDNISONE 20 MG PO TABS: ORAL_TABLET | ORAL | Status: DC

## 2014-11-02 MED ORDER — HYDROMORPHONE HCL 2 MG PO TABS
4.0000 mg | ORAL_TABLET | ORAL | Status: DC
  Administered 2014-11-02 (×2): 4 mg via ORAL
  Filled 2014-11-02 (×2): qty 2

## 2014-11-02 MED ORDER — KETOROLAC TROMETHAMINE 30 MG/ML IJ SOLN
30.0000 mg | Freq: Three times a day (TID) | INTRAMUSCULAR | Status: DC
  Filled 2014-11-02: qty 1

## 2014-11-02 NOTE — Progress Notes (Signed)
Patient ID: Andre [name], male   DOB: [birth year], 29 y.o.   MRN: [id number]    Progress Note   Subjective  Feels better- wants to be discharged today- feels he can manage outpt, and plans to go back home this week. Tolerating solid food, less pain, and less Bm's   Objective   Vital signs in last 24 hours: Temp:  [97.8 F (36.6 C)-98.9 F (37.2 C)] 97.8 F (36.6 C) (09/11 0542) Pulse Rate:  [72-93] 72 (09/11 0542) Resp:  [18] 18 (09/11 0542) BP: (98-107)/(51-67) 107/67 mmHg (09/11 0542) SpO2:  [94 %-100 %] 94 % (09/11 0542) Last BM Date: 11/02/14 General:    White male in NAD Heart:  Regular rate and rhythm; no murmurs Lungs: Respirations even and unlabored, lungs CTA bilaterally Abdomen:  Soft, ender LLQ and nondistended. Normal bowel sounds. Extremities:  Without edema. Neurologic:  Alert and oriented,  grossly normal neurologically. Psych:  Cooperative. Normal mood and affect.  Intake/Output from previous day: 09/10 0701 - 09/11 0700 In: 240 [P.O.:240] Out: -  Intake/Output this shift:    Lab Results: No results for input(s): WBC, HGB, HCT, PLT in the last 72 hours. BMET  Recent Labs  10/31/14 0415 11/02/14 0615  NA 140 140  K 3.0* 3.2*  CL 104 112*  CO2 27 20*  GLUCOSE 126* 146*  BUN <5* 9  CREATININE 0.80 0.86  CALCIUM 8.1* 8.4*   LFT No results for input(s): PROT, ALBUMIN, AST, ALT, ALKPHOS, BILITOT, BILIDIR, IBILI in the last 72 hours. PT/INR No results for input(s): LABPROT, INR in the last 72 hours.  Studies/Results: Mr Andre [name] W/o W/cm  10/31/2014   CLINICAL DATA:  History of Crohn's disease  EXAM: MR ABDOMEN AND PELVIS WITHOUT AND WITH CONTRAST (MR ENTEROGRAPHY)  TECHNIQUE: Multiplanar, multisequence MRI of the abdomen and pelvis was performed both before and during bolus administration of intravenous contrast. Negative oral contrast VoLumen was given.  CONTRAST:  16mL MULTIHANCE GADOBENATE DIMEGLUMINE 529 MG/ML IV SOLN  COMPARISON:  10/25/2014   FINDINGS: MR ABDOMEN FINDINGS  Hepatobiliary: No focal liver abnormality identified. The gallbladder is normal. There is no biliary dilatation.  Pancreas: Normal appearance of the pancreas.  Spleen: Negative  Adrenals/Urinary Tract: The adrenal glands are normal. Normal appearance of the kidneys. No focal kidney abnormality identified.  Stomach/Bowel: The stomach appears within normal limits. Mild increased caliber of the small bowel loops. The proximal jejunal loops measure up to 2.9 cm. The previously noted proximal small bowel loop with wall thickening is no longer visualized. The dilated distal small bowel loops near the enterocolonic anastomosis have improved currently measuring up to 3.4 cm versus 5.5 cm before. There is stable to improved inflammation in the ileocolic mesenterary adjacent to the enterocolonic anastomosis. Resolution of previous right lower quadrant free fluid. No fluid collections identified.  Vascular/Lymphatic: The abdominal aorta appears normal. No enlarged upper abdominal lymph nodes.  Other: No fluid collections or free fluid identified.  Musculoskeletal: No abnormal signal identified throughout the bone marrow.  MR PELVIS FINDINGS  Urinary Tract: The urinary bladder is unremarkable.  Bowel: The rectum appears normal. No wall thickening or inflammation involving the rectum identified.  Vascular/Lymphatic: Iliac vessels are within normal limits. No pelvic or inguinal adenopathy noted.  Reproductive: Prostate gland and seminal vesicles appear normal.  Other: No free fluid or fluid collections identified within the pelvis.  Musculoskeletal: No abnormal signal identified within the bone marrow.  IMPRESSION: 1. Persistent but improved small bowel dilatation. 2. Ileocolic mesenteric  inflammation is stable to improved in the interval. Free fluid in the right lower quadrant has resolved in the interval. At this time there is no evidence for abscess formation or penetrating disease. 3. No new  findings identified.   Electronically Signed   By: Andre [name] M.D.   On: 10/31/2014 19:58   Mr Andre [name] Pelvis W/o W/cm  10/31/2014   CLINICAL DATA:  History of Crohn's disease  EXAM: MR ABDOMEN AND PELVIS WITHOUT AND WITH CONTRAST (MR ENTEROGRAPHY)  TECHNIQUE: Multiplanar, multisequence MRI of the abdomen and pelvis was performed both before and during bolus administration of intravenous contrast. Negative oral contrast VoLumen was given.  CONTRAST:  19mL MULTIHANCE GADOBENATE DIMEGLUMINE 529 MG/ML IV SOLN  COMPARISON:  10/25/2014  FINDINGS: MR ABDOMEN FINDINGS  Hepatobiliary: No focal liver abnormality identified. The gallbladder is normal. There is no biliary dilatation.  Pancreas: Normal appearance of the pancreas.  Spleen: Negative  Adrenals/Urinary Tract: The adrenal glands are normal. Normal appearance of the kidneys. No focal kidney abnormality identified.  Stomach/Bowel: The stomach appears within normal limits. Mild increased caliber of the small bowel loops. The proximal jejunal loops measure up to 2.9 cm. The previously noted proximal small bowel loop with wall thickening is no longer visualized. The dilated distal small bowel loops near the enterocolonic anastomosis have improved currently measuring up to 3.4 cm versus 5.5 cm before. There is stable to improved inflammation in the ileocolic mesenterary adjacent to the enterocolonic anastomosis. Resolution of previous right lower quadrant free fluid. No fluid collections identified.  Vascular/Lymphatic: The abdominal aorta appears normal. No enlarged upper abdominal lymph nodes.  Other: No fluid collections or free fluid identified.  Musculoskeletal: No abnormal signal identified throughout the bone marrow.  MR PELVIS FINDINGS  Urinary Tract: The urinary bladder is unremarkable.  Bowel: The rectum appears normal. No wall thickening or inflammation involving the rectum identified.  Vascular/Lymphatic: Iliac vessels are within normal limits. No pelvic  or inguinal adenopathy noted.  Reproductive: Prostate gland and seminal vesicles appear normal.  Other: No free fluid or fluid collections identified within the pelvis.  Musculoskeletal: No abnormal signal identified within the bone marrow.  IMPRESSION: 1. Persistent but improved small bowel dilatation. 2. Ileocolic mesenteric inflammation is stable to improved in the interval. Free fluid in the right lower quadrant has resolved in the interval. At this time there is no evidence for abscess formation or penetrating disease. 3. No new findings identified.   Electronically Signed   By: Andre [name] M.D.   On: 10/31/2014 19:58       Assessment / Plan:    #1 29 yo male with longstanding Crohns ileocolitis with exacerbation - improved with Iv steroids. Plan is for discharge today Would d/c on prednisone 40 mg po qam x 2 weeks then decrease to 30 mg po qam  Continue Azathioprine at current dose  Continue Flagyl 500 tid x 10 days  will need an analgesic- Not NSAIDS He knows he needs to see his regular Gastroenterologist as soon as he returns home    LOS: 7 days   Amy Esterwood  11/02/2014, 9:50 AM  ________________________________________________________________________  [facility] MD note:  I personally examined the patient, reviewed the data and agree with the assessment and plan described above.  D/c home to [place].   Rob Bunting, MD [facility] Pager [phone]

## 2014-11-02 NOTE — Progress Notes (Signed)
Andre [name] to be D/C'd Home per MD order.  Discussed with the patient and all questions fully answered.  VSS. IV catheter discontinued intact. Site without signs and symptoms of complications. Dressing and pressure applied.  An After Visit Summary was printed and given to the patient. Patient received prescriptions.  D/c education completed with patient/family including follow up instructions, medication list, d/c activities limitations if indicated, with other d/c instructions as indicated by MD - patient able to verbalize understanding, all questions fully answered.   Patient instructed to return to ED, call 911, or call MD for any changes in condition.   Patient D/C home via private auto.  Burt Ek 11/02/2014 12:45 PM

## 2014-11-02 NOTE — Progress Notes (Signed)
Patient refused PO pain medication overnight. Stated "it does not work. I want the IV" Patient also refused SCD's. Patient has been educated on the need to try PO pain medication and the importance of SCD's. Will continue to monitor and educate. Cindee Salt, RN

## 2014-11-02 NOTE — Discharge Summary (Signed)
Physician Discharge Summary  Martell Lindell CNO:[id number] DOB: June 06, 1985 DOA: 10/26/2014  PCP: No primary care provider on file.  Admit date: 10/26/2014 Discharge date: 11/02/2014  Recommendations for Outpatient Follow-up:  1. Given prescription for prednisone taper and Flagyl 2. Patient already has azathioprine at home 3. He should follow-up with his gastroenterologist as soon as he returns to [facility]  Discharge Diagnoses:  Principal Problem:   Crohn's colitis Active Problems:   Acute hypokalemia   Acute hyperglycemia   Refractory nausea and vomiting   Dehydration, moderate   Microcytic anemia   Bipolar affective disorder   Anxiety disorder   Acute Crohn's disease   Discharge Condition: Stable, improved  Diet recommendation: Regular  Wt Readings from Last 3 Encounters:  11/01/14 71.8 kg (158 lb 4.6 oz)    History of present illness:  29 year old male with Crohn's disease diagnosed at age of 9. Over his lifetime, he was undergone multiple colonic and small bowel resections resulting with an endpoint of basically a subtotal colectomy. He presented with 36 hours of abdominal pain, diarrhea, and stool incontinence. He has multiple drug allergies including to Remicade and and Humira. He was seen in the ER Saturday 9/3 with increased frequency of stools and increasing left lower quadrant abdominal pain with nausea. CT that time revealed trace free fluid around his ileo-colonic anastomosis well as a few loops of dilated small bowel. His symptoms improved after anti-medics and pain medication. He was offered admission but felt as if he could manage with outpatient oral antibiotics he was given a dose of IV site Medrol in the ER and discharged from the ER with prednisone 40 mg twice a day. He returned to the ER today with worsening abdominal pain nausea and vomiting and diarrhea. He again reports averaging one stool in hour which at baseline he typically only has 3 stools per day. He always  has a trace amount of blood in his stools he describes as "spotting". He thinks the pain medication may be contributing to his nausea. Chronically patient has not experienced any weight loss. His physicians are in the [facility] area. For most flares he typically responds to IV steroids with Cipro and Flagyl. Despite several days of IV antibiotics and steroids, he continued to have frequent loose stools with incontinence.  9/7: Flex sig for eval for CMV colitis 9/9: MR enterography with small bowel inflammatino  Hospital Course:   Crohn's colitis, diarrhea. C. Diff negative.  He was started on antibiotics and steroids but had no improvement in his diarrhea or abdominal pain over several days. He continued his azathioprine and gastroenterology was consulted.   He underwent flexible sigmoidoscopy to evaluate for possible CMV colitis, however there is no evidence on examination of CMV and pathology and CMV PCR are pending at the time of discharge. He went follow-up MR enterography which demonstrated small bowel inflammation without evidence of obstruction. He was able to advance his diet and was transitioned to oral pain medications. His abdominal pain improved and his diarrhea decreased in frequency and he no longer had stool incontinence. He was advised to continue his azathioprine and was given a prescription for Flagyl for 10 days and prednisone taper of 40 mg for 2 weeks followed by 30 mg daily until he is able to follow up with his primary gastroenterologist.   Acute hypokalemia due to not eating and drinking.  Given oral and IV potassium supplementation.    Hyperglycemia, elevated secondary to steroids, hemoglobin A1c 5.5  Refractory nausea and  vomiting, improved with antiemetics   Dehydration, moderate with mild elevation in lactic acid, resolved with IVF.    Microcytic anemia, hemoglobin approximately stable. Likely has component of iron deficiency from chronic blood loss AND  anemia of inflammation from crohns. Will not check labs at this time due to acute flare but will need close follow up.  Started iron supplementation once daily until he follows up with his gastroenterologist.    Consultants:  Gastroenterology  Procedures:  Flexible sigmoidoscopy on 9/7  MR enterography on 9/9  Antibiotics:  Flagyl started 9/4  Discharge Exam: Filed Vitals:   11/02/14 0542  BP: 107/67  Pulse: 72  Temp: 97.8 F (36.6 C)  Resp: 18   Filed Vitals:   11/01/14 0809 11/01/14 1337 11/01/14 2238 11/02/14 0542  BP: 116/74 98/51 105/59 107/67  Pulse: 68 80 93 72  Temp: 97.8 F (36.6 C) 98.4 F (36.9 C) 98.9 F (37.2 C) 97.8 F (36.6 C)  TempSrc: Oral Oral Oral   Resp: 19 18 18 18   Height:      Weight:      SpO2: 100% 100% 100% 94%     General: Overweight adult male, No acute distress  HEENT: NCAT, MMM  Cardiovascular: RRR, nl S1, S2 no mrg, 2+ pulses, warm extremities  Respiratory: CTAB, no increased WOB  Abdomen: NABS, soft, nondistended, minimally tender to palpation in the left peri-umbilical area, left lower quadrant and slightly to the left of the suprapubic area, no rebound or guarding  MSK: Normal tone and bulk, no LEE  Discharge Instructions      Discharge Instructions    Call MD for:  difficulty breathing, headache or visual disturbances    Complete by:  As directed      Call MD for:  extreme fatigue    Complete by:  As directed      Call MD for:  hives    Complete by:  As directed      Call MD for:  persistant dizziness or light-headedness    Complete by:  As directed      Call MD for:  persistant nausea and vomiting    Complete by:  As directed      Call MD for:  severe uncontrolled pain    Complete by:  As directed      Call MD for:  temperature >100.4    Complete by:  As directed      Diet general    Complete by:  As directed      Discharge instructions    Complete by:  As directed   Please take flagyl 500mg  three  times a day until all the tabs are gone.  Take prednisone 40mg  once daily for the next two weeks, then reduce your dose to 30mg  once daily thereafter.  Continue your azathioprine and follow up with your primary gastroenterologist as soon as possible.  If you have worsening abdominal pain, diarrhea, or become dehydrated, please return to the hospital.     Increase activity slowly    Complete by:  As directed             Medication List    TAKE these medications        ALPRAZolam 0.5 MG tablet  Commonly known as:  XANAX  Take 0.5 mg by mouth 2 (two) times daily.     azaTHIOprine 50 MG tablet  Commonly known as:  IMURAN  Take 175 mg by mouth daily.     gabapentin  300 MG capsule  Commonly known as:  NEURONTIN  Take 900 mg by mouth 3 (three) times daily.     hydrocortisone 25 MG suppository  Commonly known as:  ANUSOL-HC  Place 1 suppository (25 mg total) rectally 2 (two) times daily as needed (rectal pain).     metroNIDAZOLE 500 MG tablet  Commonly known as:  FLAGYL  Take 1 tablet (500 mg total) by mouth 3 (three) times daily.     ondansetron 4 MG disintegrating tablet  Commonly known as:  ZOFRAN ODT  Take 1 tablet (4 mg total) by mouth every 8 (eight) hours as needed for nausea or vomiting.     oxyCODONE-acetaminophen 5-325 MG per tablet  Commonly known as:  PERCOCET/ROXICET  Take 1-2 tablets by mouth every 4 (four) hours as needed for severe pain.     predniSONE 20 MG tablet  Commonly known as:  DELTASONE  Take two tabs once daily for 14 days through 9/24.  On 9/25, start one and a half tabs daily thereafter       Follow-up Information    Follow up with Gastroenterologist. Schedule an appointment as soon as possible for a visit in 1 week.       The results of significant diagnostics from this hospitalization (including imaging, microbiology, ancillary and laboratory) are listed below for reference.    Significant Diagnostic Studies: Ct Abdomen Pelvis W  Contrast  10/25/2014   CLINICAL DATA:  29 year old male with left lower quadrant pain, diarrhea and nausea for the past 24 hours. History of Crohn's disease.  EXAM: CT ABDOMEN AND PELVIS WITH CONTRAST  TECHNIQUE: Multidetector CT imaging of the abdomen and pelvis was performed using the standard protocol following bolus administration of intravenous contrast.  CONTRAST:  OMNIPAQUE IOHEXOL 300 MG/ML  SOLN  COMPARISON:  CT the abdomen and pelvis 04/09/2009.  FINDINGS: Lower chest: Innumerable tiny peribronchovascular micronodules in the right lower lobe. The largest single nodule measures 5 mm in the medial right lower lobe (image 6 of series 3).  Hepatobiliary: No cystic or solid hepatic lesions. No intra or extrahepatic biliary ductal dilatation. Gallbladder is normal in appearance.  Pancreas: No pancreatic mass. No pancreatic ductal dilatation. No pancreatic or peripancreatic fluid or inflammatory changes.  Spleen: Spleen is mildly enlarged measuring 13.3 x 4.9 x 11.3 cm (estimated splenic volume of 368 mL).  Adrenals/Urinary Tract: Bilateral adrenal glands and bilateral kidneys are normal in appearance. No hydroureteronephrosis. Urinary bladder is normal in appearance.  Stomach/Bowel: The appearance of the stomach is normal. Status post subtotal colectomy and partial small bowel resection. There is some soft tissue thickening near the ileocolic anastomosis (colonic side). Several dilated loops of distal small bowel are noted, measuring up to 5.2 cm in diameter shortly before the anastomosis, however, no definite signs to suggest bowel obstruction are noted at this time. There is some hypervascularity and haziness in the ileocolic mesentery adjacent to the anastomosis, and there is a small amount of adjacent free fluid, suggesting some active inflammation. There several more proximal loops of small bowel which appear narrowed and thick walled, most notably in the proximal jejunum (image 37 of series 2). Other  thickened loops of small bowel in the right side of the abdomen are favored to be related to complete decompression, as there are no surrounding inflammatory changes adjacent to these loops.  Vascular/Lymphatic: No significant atherosclerotic disease, aneurysm or dissection identified in the abdominal or pelvic vasculature. No lymphadenopathy noted in the abdomen or pelvis.  Reproductive:  Prostate gland and seminal vesicles are unremarkable in appearance.  Other: No significant volume of ascites.  No pneumoperitoneum.  Musculoskeletal: There are no aggressive appearing lytic or blastic lesions noted in the visualized portions of the skeleton.  IMPRESSION: 1. Postoperative changes of subtotal colectomy and partial small bowel resection, as above, with mild thickening near the ileocolonic anastomosis, and mild hypervascularity and inflammatory changes in the adjacent mesentery, suggesting some activity in this patient with history of Crohn's disease. 2. Mild splenomegaly. 3. Multiple tiny peribronchovascular micronodules in the right lower lobe, new compared to remote prior study 04/10/2011. These are nonspecific, but favored to be benign, potentially sequela of prior episodes of aspiration. 4. Additional incidental findings, as above.   Electronically Signed   By: Trudie Reed M.D.   On: 10/25/2014 14:45   Mr Adora Fridge W/o W/cm  10/31/2014   CLINICAL DATA:  History of Crohn's disease  EXAM: MR ABDOMEN AND PELVIS WITHOUT AND WITH CONTRAST (MR ENTEROGRAPHY)  TECHNIQUE: Multiplanar, multisequence MRI of the abdomen and pelvis was performed both before and during bolus administration of intravenous contrast. Negative oral contrast VoLumen was given.  CONTRAST:  16mL MULTIHANCE GADOBENATE DIMEGLUMINE 529 MG/ML IV SOLN  COMPARISON:  10/25/2014  FINDINGS: MR ABDOMEN FINDINGS  Hepatobiliary: No focal liver abnormality identified. The gallbladder is normal. There is no biliary dilatation.  Pancreas: Normal appearance  of the pancreas.  Spleen: Negative  Adrenals/Urinary Tract: The adrenal glands are normal. Normal appearance of the kidneys. No focal kidney abnormality identified.  Stomach/Bowel: The stomach appears within normal limits. Mild increased caliber of the small bowel loops. The proximal jejunal loops measure up to 2.9 cm. The previously noted proximal small bowel loop with wall thickening is no longer visualized. The dilated distal small bowel loops near the enterocolonic anastomosis have improved currently measuring up to 3.4 cm versus 5.5 cm before. There is stable to improved inflammation in the ileocolic mesenterary adjacent to the enterocolonic anastomosis. Resolution of previous right lower quadrant free fluid. No fluid collections identified.  Vascular/Lymphatic: The abdominal aorta appears normal. No enlarged upper abdominal lymph nodes.  Other: No fluid collections or free fluid identified.  Musculoskeletal: No abnormal signal identified throughout the bone marrow.  MR PELVIS FINDINGS  Urinary Tract: The urinary bladder is unremarkable.  Bowel: The rectum appears normal. No wall thickening or inflammation involving the rectum identified.  Vascular/Lymphatic: Iliac vessels are within normal limits. No pelvic or inguinal adenopathy noted.  Reproductive: Prostate gland and seminal vesicles appear normal.  Other: No free fluid or fluid collections identified within the pelvis.  Musculoskeletal: No abnormal signal identified within the bone marrow.  IMPRESSION: 1. Persistent but improved small bowel dilatation. 2. Ileocolic mesenteric inflammation is stable to improved in the interval. Free fluid in the right lower quadrant has resolved in the interval. At this time there is no evidence for abscess formation or penetrating disease. 3. No new findings identified.   Electronically Signed   By: Signa Kell M.D.   On: 10/31/2014 19:58   Mr [name] Pelvis W/o W/cm  10/31/2014   CLINICAL DATA:  History of Crohn's  disease  EXAM: MR ABDOMEN AND PELVIS WITHOUT AND WITH CONTRAST (MR ENTEROGRAPHY)  TECHNIQUE: Multiplanar, multisequence MRI of the abdomen and pelvis was performed both before and during bolus administration of intravenous contrast. Negative oral contrast VoLumen was given.  CONTRAST:  16mL MULTIHANCE GADOBENATE DIMEGLUMINE 529 MG/ML IV SOLN  COMPARISON:  10/25/2014  FINDINGS: MR ABDOMEN FINDINGS  Hepatobiliary: No focal  liver abnormality identified. The gallbladder is normal. There is no biliary dilatation.  Pancreas: Normal appearance of the pancreas.  Spleen: Negative  Adrenals/Urinary Tract: The adrenal glands are normal. Normal appearance of the kidneys. No focal kidney abnormality identified.  Stomach/Bowel: The stomach appears within normal limits. Mild increased caliber of the small bowel loops. The proximal jejunal loops measure up to 2.9 cm. The previously noted proximal small bowel loop with wall thickening is no longer visualized. The dilated distal small bowel loops near the enterocolonic anastomosis have improved currently measuring up to 3.4 cm versus 5.5 cm before. There is stable to improved inflammation in the ileocolic mesenterary adjacent to the enterocolonic anastomosis. Resolution of previous right lower quadrant free fluid. No fluid collections identified.  Vascular/Lymphatic: The abdominal aorta appears normal. No enlarged upper abdominal lymph nodes.  Other: No fluid collections or free fluid identified.  Musculoskeletal: No abnormal signal identified throughout the bone marrow.  MR PELVIS FINDINGS  Urinary Tract: The urinary bladder is unremarkable.  Bowel: The rectum appears normal. No wall thickening or inflammation involving the rectum identified.  Vascular/Lymphatic: Iliac vessels are within normal limits. No pelvic or inguinal adenopathy noted.  Reproductive: Prostate gland and seminal vesicles appear normal.  Other: No free fluid or fluid collections identified within the pelvis.   Musculoskeletal: No abnormal signal identified within the bone marrow.  IMPRESSION: 1. Persistent but improved small bowel dilatation. 2. Ileocolic mesenteric inflammation is stable to improved in the interval. Free fluid in the right lower quadrant has resolved in the interval. At this time there is no evidence for abscess formation or penetrating disease. 3. No new findings identified.   Electronically Signed   By: Signa Kell M.D.   On: 10/31/2014 19:58    Microbiology: Recent Results (from the past 240 hour(s))  C difficile quick scan w PCR reflex     Status: None   Collection Time: 10/26/14  4:04 PM  Result Value Ref Range Status   C Diff antigen NEGATIVE NEGATIVE Final   C Diff toxin NEGATIVE NEGATIVE Final   C Diff interpretation Negative for toxigenic C. difficile  Final  Ova and parasite examination     Status: None   Collection Time: 10/30/14  3:15 PM  Result Value Ref Range Status   Specimen Description PERIRECTAL  Final   Special Requests NONE  Final   Ova and parasites   Final    NO OVA OR PARASITES SEEN Performed at [facility]    Report Status 10/31/2014 FINAL  Final     Labs: Basic Metabolic Panel:  Recent Labs Lab 10/27/14 0447 10/28/14 0543 10/31/14 0415 11/02/14 0615  NA 138 143 140 140  K 4.1 3.2* 3.0* 3.2*  CL 110 111 104 112*  CO2 19* 23 27 20*  GLUCOSE 153* 82 126* 146*  BUN <5* <5* <5* 9  CREATININE 0.77 0.79 0.80 0.86  CALCIUM 8.5* 8.6* 8.1* 8.4*   Liver Function Tests:  Recent Labs Lab 10/27/14 0447 10/28/14 0543  AST 24 15  ALT 18 17  ALKPHOS 57 60  BILITOT 0.7 1.0  PROT 6.0* 5.8*  ALBUMIN 3.2* 3.2*   No results for input(s): LIPASE, AMYLASE in the last 168 hours. No results for input(s): AMMONIA in the last 168 hours. CBC:  Recent Labs Lab 10/27/14 0447 10/28/14 0543 10/30/14 0355  WBC 13.4* 9.2 12.2*  HGB 10.4* 11.0* 11.0*  HCT 34.3* 37.0* 36.1*  MCV 78.9 79.1 78.1  PLT 203 175 175  Cardiac Enzymes: No  results for input(s): CKTOTAL, CKMB, CKMBINDEX, TROPONINI in the last 168 hours. BNP: BNP (last 3 results) No results for input(s): BNP in the last 8760 hours.  ProBNP (last 3 results) No results for input(s): PROBNP in the last 8760 hours.  CBG: No results for input(s): GLUCAP in the last 168 hours.  Time coordinating discharge: 35 minutes  Signed:  Diannia Hogenson  [facility] 11/02/2014, 1:07 PM

## 2014-11-03 LAB — CMV (CYTOMEGALOVIRUS) DNA ULTRAQUANT, PCR
CMV DNA QUANT: NEGATIVE [IU]/mL
Log10 CMV Qn DNA Pl: UNDETERMINED log10 IU/mL

## 2014-11-04 ENCOUNTER — Encounter (HOSPITAL_COMMUNITY): Payer: Self-pay | Admitting: *Deleted

## 2014-11-04 ENCOUNTER — Emergency Department (HOSPITAL_COMMUNITY)
Admission: EM | Admit: 2014-11-04 | Discharge: 2014-11-05 | Disposition: A | Discharge status: Home / Self Care | Payer: Medicare Other | Source: Home / Self Care | Attending: Emergency Medicine | Admitting: Emergency Medicine

## 2014-11-04 DIAGNOSIS — E860 Dehydration: Secondary | ICD-10-CM | POA: Diagnosis present

## 2014-11-04 DIAGNOSIS — D72829 Elevated white blood cell count, unspecified: Secondary | ICD-10-CM | POA: Diagnosis not present

## 2014-11-04 DIAGNOSIS — Z888 Allergy status to other drugs, medicaments and biological substances status: Secondary | ICD-10-CM

## 2014-11-04 DIAGNOSIS — F319 Bipolar disorder, unspecified: Secondary | ICD-10-CM | POA: Diagnosis not present

## 2014-11-04 DIAGNOSIS — E872 Acidosis: Secondary | ICD-10-CM | POA: Diagnosis present

## 2014-11-04 DIAGNOSIS — F419 Anxiety disorder, unspecified: Secondary | ICD-10-CM | POA: Diagnosis present

## 2014-11-04 DIAGNOSIS — Z886 Allergy status to analgesic agent status: Secondary | ICD-10-CM

## 2014-11-04 DIAGNOSIS — T380X5A Adverse effect of glucocorticoids and synthetic analogues, initial encounter: Secondary | ICD-10-CM | POA: Diagnosis present

## 2014-11-04 DIAGNOSIS — K50919 Crohn's disease, unspecified, with unspecified complications: Secondary | ICD-10-CM

## 2014-11-04 DIAGNOSIS — K5090 Crohn's disease, unspecified, without complications: Principal | ICD-10-CM | POA: Diagnosis present

## 2014-11-04 DIAGNOSIS — E1165 Type 2 diabetes mellitus with hyperglycemia: Secondary | ICD-10-CM | POA: Diagnosis present

## 2014-11-04 DIAGNOSIS — Z885 Allergy status to narcotic agent status: Secondary | ICD-10-CM

## 2014-11-04 DIAGNOSIS — Z7952 Long term (current) use of systemic steroids: Secondary | ICD-10-CM

## 2014-11-04 DIAGNOSIS — R1084 Generalized abdominal pain: Secondary | ICD-10-CM

## 2014-11-04 DIAGNOSIS — R109 Unspecified abdominal pain: Secondary | ICD-10-CM | POA: Diagnosis present

## 2014-11-04 LAB — COMPREHENSIVE METABOLIC PANEL
ALBUMIN: 4.6 g/dL (ref 3.5–5.0)
ALK PHOS: 66 U/L (ref 38–126)
ALT: 37 U/L (ref 17–63)
ANION GAP: 8 (ref 5–15)
AST: 42 U/L — ABNORMAL HIGH (ref 15–41)
BUN: 13 mg/dL (ref 6–20)
CALCIUM: 9.2 mg/dL (ref 8.9–10.3)
CHLORIDE: 109 mmol/L (ref 101–111)
CO2: 24 mmol/L (ref 22–32)
CREATININE: 0.94 mg/dL (ref 0.61–1.24)
GFR calc non Af Amer: >60 mL/min (ref >60)
GLUCOSE: 107 mg/dL — AB (ref 65–99)
Potassium: 4.1 mmol/L (ref 3.5–5.1)
SODIUM: 141 mmol/L (ref 135–145)
Total Bilirubin: 0.7 mg/dL (ref 0.3–1.2)
Total Protein: 7.2 g/dL (ref 6.5–8.1)

## 2014-11-04 LAB — CBC
HCT: 45.9 % (ref 39.0–52.0)
HEMOGLOBIN: 14 g/dL (ref 13.0–17.0)
MCH: 24.6 pg — AB (ref 26.0–34.0)
MCHC: 30.5 g/dL (ref 30.0–36.0)
MCV: 80.7 fL (ref 78.0–100.0)
Platelets: 258 10*3/uL (ref 150–400)
RBC: 5.69 MIL/uL (ref 4.22–5.81)
RDW: 16.4 % — ABNORMAL HIGH (ref 11.5–15.5)
WBC: 13.7 10*3/uL — ABNORMAL HIGH (ref 4.0–10.5)

## 2014-11-04 LAB — LIPASE, BLOOD: LIPASE: 18 U/L — AB (ref 22–51)

## 2014-11-04 NOTE — ED Notes (Signed)
Per EMS, pt from home, reports crohn's flare up.  Was seen at [facility] for same about a week ago.  Reports pain is not getting any better.

## 2014-11-05 ENCOUNTER — Encounter (HOSPITAL_COMMUNITY): Payer: Self-pay | Admitting: Emergency Medicine

## 2014-11-05 ENCOUNTER — Inpatient Hospital Stay (HOSPITAL_COMMUNITY)
Admission: EM | Admit: 2014-11-05 | Discharge: 2014-11-09 | DRG: 386 | Disposition: A | Discharge status: Home / Self Care | Payer: Medicare Other | Attending: Internal Medicine | Admitting: Internal Medicine

## 2014-11-05 ENCOUNTER — Emergency Department (HOSPITAL_COMMUNITY): Payer: Medicare Other

## 2014-11-05 DIAGNOSIS — E872 Acidosis: Secondary | ICD-10-CM | POA: Diagnosis not present

## 2014-11-05 DIAGNOSIS — T380X5A Adverse effect of glucocorticoids and synthetic analogues, initial encounter: Secondary | ICD-10-CM | POA: Diagnosis not present

## 2014-11-05 DIAGNOSIS — E1165 Type 2 diabetes mellitus with hyperglycemia: Secondary | ICD-10-CM | POA: Diagnosis not present

## 2014-11-05 DIAGNOSIS — R112 Nausea with vomiting, unspecified: Secondary | ICD-10-CM | POA: Diagnosis not present

## 2014-11-05 DIAGNOSIS — R109 Unspecified abdominal pain: Secondary | ICD-10-CM | POA: Diagnosis present

## 2014-11-05 DIAGNOSIS — F419 Anxiety disorder, unspecified: Secondary | ICD-10-CM | POA: Diagnosis not present

## 2014-11-05 DIAGNOSIS — K5090 Crohn's disease, unspecified, without complications: Secondary | ICD-10-CM | POA: Diagnosis present

## 2014-11-05 DIAGNOSIS — Z886 Allergy status to analgesic agent status: Secondary | ICD-10-CM | POA: Diagnosis not present

## 2014-11-05 DIAGNOSIS — R1084 Generalized abdominal pain: Secondary | ICD-10-CM | POA: Diagnosis not present

## 2014-11-05 DIAGNOSIS — K50918 Crohn's disease, unspecified, with other complication: Secondary | ICD-10-CM | POA: Diagnosis not present

## 2014-11-05 DIAGNOSIS — F319 Bipolar disorder, unspecified: Secondary | ICD-10-CM | POA: Diagnosis not present

## 2014-11-05 DIAGNOSIS — Z7952 Long term (current) use of systemic steroids: Secondary | ICD-10-CM | POA: Diagnosis not present

## 2014-11-05 DIAGNOSIS — E860 Dehydration: Secondary | ICD-10-CM | POA: Diagnosis not present

## 2014-11-05 DIAGNOSIS — E8729 Other acidosis: Secondary | ICD-10-CM | POA: Diagnosis present

## 2014-11-05 DIAGNOSIS — Z885 Allergy status to narcotic agent status: Secondary | ICD-10-CM | POA: Diagnosis not present

## 2014-11-05 DIAGNOSIS — Z888 Allergy status to other drugs, medicaments and biological substances status: Secondary | ICD-10-CM | POA: Diagnosis not present

## 2014-11-05 DIAGNOSIS — D72829 Elevated white blood cell count, unspecified: Secondary | ICD-10-CM | POA: Diagnosis not present

## 2014-11-05 LAB — URINALYSIS, ROUTINE W REFLEX MICROSCOPIC
Bilirubin Urine: NEGATIVE
Bilirubin Urine: NEGATIVE
GLUCOSE, UA: NEGATIVE mg/dL
Glucose, UA: 100 mg/dL — AB
HGB URINE DIPSTICK: NEGATIVE
Hgb urine dipstick: NEGATIVE
KETONES UR: NEGATIVE mg/dL
Ketones, ur: NEGATIVE mg/dL
LEUKOCYTES UA: NEGATIVE
LEUKOCYTES UA: NEGATIVE
NITRITE: NEGATIVE
Nitrite: NEGATIVE
PH: 6 (ref 5.0–8.0)
PROTEIN: NEGATIVE mg/dL
Protein, ur: NEGATIVE mg/dL
Specific Gravity, Urine: 1.045 — ABNORMAL HIGH (ref 1.005–1.030)
Specific Gravity, Urine: 1.015 (ref 1.005–1.030)
Urobilinogen, UA: 0.2 mg/dL (ref 0.0–1.0)
Urobilinogen, UA: 0.2 mg/dL (ref 0.0–1.0)
pH: 6 (ref 5.0–8.0)

## 2014-11-05 LAB — BASIC METABOLIC PANEL
ANION GAP: 17 — AB (ref 5–15)
BUN: 11 mg/dL (ref 6–20)
CO2: 13 mmol/L — ABNORMAL LOW (ref 22–32)
Calcium: 9.5 mg/dL (ref 8.9–10.3)
Chloride: 109 mmol/L (ref 101–111)
Creatinine, Ser: 1.02 mg/dL (ref 0.61–1.24)
Glucose, Bld: 203 mg/dL — ABNORMAL HIGH (ref 65–99)
POTASSIUM: 4.4 mmol/L (ref 3.5–5.1)
SODIUM: 139 mmol/L (ref 135–145)

## 2014-11-05 LAB — CBC
HCT: 43.2 % (ref 39.0–52.0)
Hemoglobin: 13.7 g/dL (ref 13.0–17.0)
MCH: 25.3 pg — ABNORMAL LOW (ref 26.0–34.0)
MCHC: 31.7 g/dL (ref 30.0–36.0)
MCV: 79.7 fL (ref 78.0–100.0)
PLATELETS: 229 10*3/uL (ref 150–400)
RBC: 5.42 MIL/uL (ref 4.22–5.81)
RDW: 16.9 % — ABNORMAL HIGH (ref 11.5–15.5)
WBC: 18.1 10*3/uL — AB (ref 4.0–10.5)

## 2014-11-05 LAB — I-STAT CG4 LACTIC ACID, ED: Lactic Acid, Venous: 2.77 mmol/L (ref 0.5–2.0)

## 2014-11-05 LAB — CG4 I-STAT (LACTIC ACID): LACTIC ACID, VENOUS: 1.41 mmol/L (ref 0.5–2.0)

## 2014-11-05 LAB — LIPASE, BLOOD: LIPASE: 22 U/L (ref 22–51)

## 2014-11-05 MED ORDER — ACETAMINOPHEN 325 MG PO TABS: 650.0000 mg | ORAL_TABLET | Freq: Four times a day (QID) | ORAL | Status: DC | PRN

## 2014-11-05 MED ORDER — SODIUM CHLORIDE 0.9 % IV BOLUS (SEPSIS): 1000.0000 mL | Freq: Once | INTRAVENOUS | Status: DC

## 2014-11-05 MED ORDER — HYDROCODONE-ACETAMINOPHEN 5-325 MG PO TABS
1.0000 | ORAL_TABLET | ORAL | Status: DC | PRN
  Administered 2014-11-05 – 2014-11-09 (×13): 2 via ORAL
  Filled 2014-11-05 (×13): qty 2

## 2014-11-05 MED ORDER — ACETAMINOPHEN 650 MG RE SUPP: 650.0000 mg | Freq: Four times a day (QID) | RECTAL | Status: DC | PRN

## 2014-11-05 MED ORDER — IOHEXOL 300 MG/ML  SOLN: 25.0000 mL | Freq: Once | INTRAMUSCULAR | Status: DC | PRN

## 2014-11-05 MED ORDER — SODIUM CHLORIDE 0.9 % IV BOLUS (SEPSIS)
1000.0000 mL | Freq: Once | INTRAVENOUS | Status: AC
  Administered 2014-11-05: 1000 mL via INTRAVENOUS

## 2014-11-05 MED ORDER — ENOXAPARIN SODIUM 40 MG/0.4ML ~~LOC~~ SOLN
40.0000 mg | SUBCUTANEOUS | Status: DC
  Filled 2014-11-05: qty 0.4

## 2014-11-05 MED ORDER — HYDROMORPHONE HCL 1 MG/ML IJ SOLN
1.0000 mg | Freq: Once | INTRAMUSCULAR | Status: AC
  Administered 2014-11-05: 1 mg via INTRAVENOUS
  Filled 2014-11-05: qty 1

## 2014-11-05 MED ORDER — ONDANSETRON HCL 4 MG/2ML IJ SOLN: 4.0000 mg | Freq: Four times a day (QID) | INTRAMUSCULAR | Status: DC | PRN

## 2014-11-05 MED ORDER — KCL IN DEXTROSE-NACL 10-5-0.45 MEQ/L-%-% IV SOLN
INTRAVENOUS | Status: DC
  Administered 2014-11-05 – 2014-11-08 (×5): via INTRAVENOUS
  Filled 2014-11-05 (×13): qty 1000

## 2014-11-05 MED ORDER — KETOROLAC TROMETHAMINE 30 MG/ML IJ SOLN
30.0000 mg | Freq: Four times a day (QID) | INTRAMUSCULAR | Status: DC | PRN
  Filled 2014-11-05: qty 1

## 2014-11-05 MED ORDER — GABAPENTIN 300 MG PO CAPS
900.0000 mg | ORAL_CAPSULE | Freq: Three times a day (TID) | ORAL | Status: DC
  Administered 2014-11-05 – 2014-11-09 (×12): 900 mg via ORAL
  Filled 2014-11-05 (×8): qty 3
  Filled 2014-11-05: qty 9
  Filled 2014-11-05 (×4): qty 3

## 2014-11-05 MED ORDER — AZATHIOPRINE 50 MG PO TABS
175.0000 mg | ORAL_TABLET | Freq: Every day | ORAL | Status: DC
  Administered 2014-11-06 – 2014-11-09 (×4): 175 mg via ORAL
  Filled 2014-11-05 (×4): qty 4

## 2014-11-05 MED ORDER — HYDROCODONE-ACETAMINOPHEN 10-325 MG PO TABS
1.0000 | ORAL_TABLET | Freq: Once | ORAL | Status: AC
  Administered 2014-11-05: 1 via ORAL
  Filled 2014-11-05: qty 1

## 2014-11-05 MED ORDER — HYOSCYAMINE SULFATE 0.125 MG SL SUBL
0.2500 mg | SUBLINGUAL_TABLET | Freq: Once | SUBLINGUAL | Status: DC
  Filled 2014-11-05: qty 2

## 2014-11-05 MED ORDER — METHYLPREDNISOLONE SODIUM SUCC 125 MG IJ SOLR
80.0000 mg | Freq: Three times a day (TID) | INTRAMUSCULAR | Status: DC
  Filled 2014-11-05: qty 2

## 2014-11-05 MED ORDER — PROMETHAZINE HCL 25 MG RE SUPP: 25.0000 mg | Freq: Four times a day (QID) | RECTAL | Status: DC | PRN

## 2014-11-05 MED ORDER — ONDANSETRON HCL 4 MG/2ML IJ SOLN
4.0000 mg | Freq: Once | INTRAMUSCULAR | Status: AC
  Administered 2014-11-05: 4 mg via INTRAVENOUS
  Filled 2014-11-05: qty 2

## 2014-11-05 MED ORDER — IOHEXOL 300 MG/ML  SOLN
100.0000 mL | Freq: Once | INTRAMUSCULAR | Status: AC | PRN
  Administered 2014-11-05: 100 mL via INTRAVENOUS

## 2014-11-05 MED ORDER — DEXAMETHASONE SODIUM PHOSPHATE 10 MG/ML IJ SOLN
10.0000 mg | Freq: Once | INTRAMUSCULAR | Status: AC
Start: 2014-11-05 — End: 2014-11-05
  Administered 2014-11-05: 10 mg via INTRAVENOUS
  Filled 2014-11-05: qty 1

## 2014-11-05 MED ORDER — ONDANSETRON HCL 4 MG/2ML IJ SOLN
4.0000 mg | INTRAMUSCULAR | Status: AC
  Administered 2014-11-05: 4 mg via INTRAVENOUS
  Filled 2014-11-05: qty 2

## 2014-11-05 MED ORDER — PROMETHAZINE HCL 25 MG PO TABS
25.0000 mg | ORAL_TABLET | Freq: Once | ORAL | Status: AC
  Administered 2014-11-05: 25 mg via ORAL
  Filled 2014-11-05: qty 1

## 2014-11-05 MED ORDER — ONDANSETRON HCL 4 MG PO TABS: 4.0000 mg | ORAL_TABLET | Freq: Four times a day (QID) | ORAL | Status: DC | PRN

## 2014-11-05 NOTE — ED Provider Notes (Signed)
Complains of lower abdominal pain typical of Crohn's flareups he's had passed. Patient has been unable to hold down any medicine that vomiting today. He was seen at [facility] emergency department, release earlier this morning as they had no beds available. He was told to return if he felt worsening pain or continued vomiting. On exam he is alert and nontoxic. Chronically ill-appearing abdomen multiple surgical scars. Tender at left lower quadrant. No guarding rigidity or rebound  Doug Sou, MD 11/05/14 1702

## 2014-11-05 NOTE — ED Provider Notes (Signed)
CSN: [id number]     Arrival date & time 11/04/14  2008 History  This chart was scribed for Derwood Kaplan, MD by Evon Slack, ED Scribe. This patient was seen in room WA15/WA15 and the patient's care was started at 12:19 AM.     Chief Complaint  Patient presents with  . Abdominal Pain   The history is provided by the patient. No language interpreter was used.   HPI Comments: Andre [name] is a 29 y.o. male brought in by ambulance, who presents to the Emergency Department complaining of lower left sided abdominal pain onset 1 week prior. Pt reports associated vomiting and diarrhea.  Pt states that we recently discharged 3 days prior for similar symptoms. 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 Pt states that he is visiting from [place] and will be in [employer] until October 1st. Pt states that he usually has a crohn's flare every 3-4 months due to stress.  Pt denies hematemesis, blood in stool or other related symptoms.    Past Medical History  Diagnosis Date  . Anxiety   . Bipolar depression   . Crohn disease 1997  . Anemia     Around 2013 required PRBC transfusions. Has received parenteral iron in past. Has taken oral iron in past.   Past Surgical History  Procedure Laterality Date  . Subtotal colectomy      Has undergone a total of 3 separate bowel resections including terminal ileal resection and the equivalent of subtotal colectomy. Last surgery was in 2010.  Marland Kitchen Flexible sigmoidoscopy N/A 10/29/2014    Procedure: FLEXIBLE SIGMOIDOSCOPY;  Surgeon: Ruffin Frederick, MD;  Location: [facility] ENDOSCOPY;  Service: Gastroenterology;  Laterality: N/A;   No family history on file. Social History  Substance Use Topics  . Smoking status: Current Every Day Smoker -- 1.00 packs/day for 5 years    Types: Cigarettes  . Smokeless tobacco: Never Used  . Alcohol Use: No    Review of Systems  Gastrointestinal: Positive for vomiting, abdominal pain and diarrhea. Negative for blood in stool.  All  other systems reviewed and are negative.    Allergies  Remicade; Compazine; and Lithium  Home Medications   Prior to Admission medications   Medication Sig Start Date End Date Taking? Authorizing Provider  ALPRAZolam Prudy Feeler) 0.5 MG tablet Take 0.5 mg by mouth 2 (two) times daily.   Yes Historical Provider, MD  azaTHIOprine (IMURAN) 50 MG tablet Take 175 mg by mouth daily.   Yes Historical Provider, MD  gabapentin (NEURONTIN) 300 MG capsule Take 900 mg by mouth 3 (three) times daily.   Yes Historical Provider, MD  hydrocortisone (ANUSOL-HC) 25 MG suppository Place 1 suppository (25 mg total) rectally 2 (two) times daily as needed (rectal pain). 11/02/14  Yes Renae Fickle, MD  metroNIDAZOLE (FLAGYL) 500 MG tablet Take 1 tablet (500 mg total) by mouth 3 (three) times daily. 11/02/14  Yes Renae Fickle, MD  ondansetron (ZOFRAN ODT) 4 MG disintegrating tablet Take 1 tablet (4 mg total) by mouth every 8 (eight) hours as needed for nausea or vomiting. 11/02/14  Yes Renae Fickle, MD  oxyCODONE-acetaminophen (PERCOCET/ROXICET) 5-325 MG per tablet Take 1-2 tablets by mouth every 4 (four) hours as needed for severe pain. 11/02/14  Yes Renae Fickle, MD  predniSONE (DELTASONE) 20 MG tablet Take two tabs once daily for 14 days through 9/24.  On 9/25, start one and a half tabs daily thereafter 11/02/14  Yes Renae Fickle, MD  promethazine (PHENERGAN) 25 MG suppository Place 1 suppository (  25 mg total) rectally every 6 (six) hours as needed for nausea. 11/05/14   Danh Bayus, MD   BP 140/78 mmHg  Pulse 82  Temp(Src) 98.6 F (37 C) (Oral)  Resp 16  Ht 5\' 2"  (1.575 m)  Wt 150 lb (68.04 kg)  BMI 27.43 kg/m2  SpO2 98%   Physical Exam  Constitutional: He appears well-developed and well-nourished.  HENT:  Head: Normocephalic and atraumatic.  Mouth/Throat: Mucous membranes are dry.  Mucous membranes dry.   Eyes: Conjunctivae are normal. Right eye exhibits no discharge. Left eye exhibits no  discharge.  Cardiovascular: Normal rate, regular rhythm and normal heart sounds.   Pulmonary/Chest: Effort normal. No respiratory distress.  Lungs clear to auscultation.   Abdominal: Bowel sounds are normal.  LLQ tenderness with voluntary guarding.   Neurological: He is alert. Coordination normal.  Skin: Skin is warm and dry. No rash noted. He is not diaphoretic. No erythema.  Cap refill 3 seconds.   Psychiatric: He has a normal mood and affect.  Nursing note and vitals reviewed.   ED Course  Procedures (including critical care time) DIAGNOSTIC STUDIES: Oxygen Saturation is 100% on RA, normal by my interpretation.    COORDINATION OF CARE: 12:27 AM-Discussed treatment plan with pt at bedside and pt agreed to plan.     Labs Review Labs Reviewed  LIPASE, BLOOD - Abnormal; Notable for the following:    Lipase 18 (*)    All other components within normal limits  COMPREHENSIVE METABOLIC PANEL - Abnormal; Notable for the following:    Glucose, Bld 107 (*)    AST 42 (*)    All other components within normal limits  CBC - Abnormal; Notable for the following:    WBC 13.7 (*)    MCH 24.6 (*)    RDW 16.4 (*)    All other components within normal limits  URINALYSIS, ROUTINE W REFLEX MICROSCOPIC (NOT AT [facility]) - Abnormal; Notable for the following:    Color, Urine AMBER (*)    Specific Gravity, Urine 1.045 (*)    All other components within normal limits    Imaging Review Dg Abd Acute W/chest  11/05/2014   CLINICAL DATA:  29 year old male with left-sided abdominal pain nausea and vomiting history of Crohn's disease  EXAM: DG ABDOMEN ACUTE W/ 1V CHEST  COMPARISON:  MRI dated 10/31/2014 and CT dated 10/25/2014  FINDINGS: There is no bowel obstruction or dilatation. No radiopaque calculi identified. Multiple surgical clips noted in the left upper abdomen. Bowel anastomotic sutures noted in the pelvis. The osseous structures appear unremarkable.  IMPRESSION: No evidence of bowel  obstruction.  No free air.   Electronically Signed   By: Elgie Collard M.D.   On: 11/05/2014 02:11      EKG Interpretation None      MDM   Final diagnoses:  Crohn's disease, unspecified complication  Generalized abdominal pain   PT comes in with cc of abd pain. Pt has hx of crohns and was admitted with crohns flair up recently for close to a week. He was discharged on Sunday. Pain started again, with nausea and emesis. Pt on exam has no peritoneal signs, tenderness is typical of his crohns, and is located more focally in the LLQ. Pt has 0 SIRs criteria. His WC is slightly elevated, but it has always been slightly elevated while here, and he was on steroids. Pt observed in the ER for extended period of time. AAS ordered  -as the previous CT and MR  showed dilation of bowels and inflammation - and we dont see any free air or sbo.  Repeat exam unchanged x 2. Pt's pain is 5/10, before his last round of oral medicine. He has passed oral challenge. He is not complete pain relief, but he is not in extremis and nor is he toxic. CT scan will be more harmful than useful at ths stage, given him passing po challenge.  Strict return precautions discussed.      Derwood Kaplan, MD 11/05/14 [phone]

## 2014-11-05 NOTE — ED Notes (Signed)
Pt placed in a gown and hooked up to the monitor with a BP cuff and pulse ox 

## 2014-11-05 NOTE — ED Provider Notes (Signed)
CSN: [id number]     Arrival date & time 11/05/14  1241 History   First MD Initiated Contact with Patient 11/05/14 1428     Chief Complaint  Patient presents with  . Crohn's Disease  . Emesis  . Diarrhea    HPI   Andre [name] is a 29 y.o. male with a PMH of crohn's disease s/p bowel resection x 3 and subtotal colectomy who presents to the ED with worsening abdominal pain, nausea, and diarrhea. Patient was seen in the ED 09/03 and 09/04 for LLQ abdominal pain, nausea, and diarrhea and was found to have an active crohn's flare. He was admitted 09/04 given failed outpatient treatment. He was subsequently discharged. He was seen again 09/13 with LLQ abdominal pain, vomiting, and diarrhea and was discharged. He reports worsening LLQ pain since that time, and states he has not been able to keep anything down or take his medications. He states he has an episode of loose stool every hour. He denies melena or hematochezia. He denies fever, chills, chest pain, shortness of breath, lightheadedness, dizziness, syncope. He reports headache, which he attributes to dehydration.   Past Medical History  Diagnosis Date  . Anxiety   . Bipolar depression   . Crohn disease 1997  . Anemia     Around 2013 required PRBC transfusions. Has received parenteral iron in past. Has taken oral iron in past.   Past Surgical History  Procedure Laterality Date  . Subtotal colectomy      Has undergone a total of 3 separate bowel resections including terminal ileal resection and the equivalent of subtotal colectomy. Last surgery was in 2010.  Marland Kitchen Flexible sigmoidoscopy N/A 10/29/2014    Procedure: FLEXIBLE SIGMOIDOSCOPY;  Surgeon: Ruffin Frederick, MD;  Location: [facility] ENDOSCOPY;  Service: Gastroenterology;  Laterality: N/A;   No family history on file. Social History  Substance Use Topics  . Smoking status: Current Every Day Smoker -- 1.00 packs/day for 5 years    Types: Cigarettes  . Smokeless tobacco: Never Used  .  Alcohol Use: No    Review of Systems  Constitutional: Positive for fatigue. Negative for fever, chills, activity change and appetite change.  Respiratory: Negative for shortness of breath.   Cardiovascular: Negative for chest pain.  Gastrointestinal: Positive for nausea, vomiting, abdominal pain and diarrhea. Negative for constipation and blood in stool.  Genitourinary: Negative for dysuria, urgency and frequency.  Neurological: Negative for dizziness, syncope, weakness, light-headedness, numbness and headaches.  All other systems reviewed and are negative.     Allergies  Remicade; Compazine; and Lithium  Home Medications   Prior to Admission medications   Medication Sig Start Date End Date Taking? Authorizing Provider  ALPRAZolam Prudy Feeler) 0.5 MG tablet Take 0.5 mg by mouth 2 (two) times daily.    Historical Provider, MD  azaTHIOprine (IMURAN) 50 MG tablet Take 175 mg by mouth daily.    Historical Provider, MD  gabapentin (NEURONTIN) 300 MG capsule Take 900 mg by mouth 3 (three) times daily.    Historical Provider, MD  hydrocortisone (ANUSOL-HC) 25 MG suppository Place 1 suppository (25 mg total) rectally 2 (two) times daily as needed (rectal pain). 11/02/14   Renae Fickle, MD  metroNIDAZOLE (FLAGYL) 500 MG tablet Take 1 tablet (500 mg total) by mouth 3 (three) times daily. 11/02/14   Renae Fickle, MD  ondansetron (ZOFRAN ODT) 4 MG disintegrating tablet Take 1 tablet (4 mg total) by mouth every 8 (eight) hours as needed for nausea or vomiting. 11/02/14  Renae Fickle, MD  oxyCODONE-acetaminophen (PERCOCET/ROXICET) 5-325 MG per tablet Take 1-2 tablets by mouth every 4 (four) hours as needed for severe pain. 11/02/14   Renae Fickle, MD  predniSONE (DELTASONE) 20 MG tablet Take two tabs once daily for 14 days through 9/24.  On 9/25, start one and a half tabs daily thereafter 11/02/14   Renae Fickle, MD  promethazine (PHENERGAN) 25 MG suppository Place 1 suppository (25 mg total)  rectally every 6 (six) hours as needed for nausea. 11/05/14   Ankit Rhunette Croft, MD    BP 114/62 mmHg  Pulse 76  Temp(Src) 98.4 F (36.9 C) (Oral)  Resp 18  Ht 5\' 2"  (1.575 m)  Wt 150 lb (68.04 kg)  BMI 27.43 kg/m2  SpO2 95% Physical Exam  Constitutional: He is oriented to person, place, and time. He appears distressed.  Appears to be in distress due to pain.  HENT:  Head: Normocephalic and atraumatic.  Right Ear: External ear normal.  Left Ear: External ear normal.  Nose: Nose normal.  Mouth/Throat: Uvula is midline, oropharynx is clear and moist and mucous membranes are normal.  Eyes: Conjunctivae, EOM and lids are normal. Pupils are equal, round, and reactive to light. Right eye exhibits no discharge. Left eye exhibits no discharge. No scleral icterus.  Neck: Normal range of motion. Neck supple.  Cardiovascular: Normal rate, regular rhythm, normal heart sounds, intact distal pulses and normal pulses.   Pulmonary/Chest: Effort normal and breath sounds normal. No respiratory distress. He has no wheezes. He has no rales.  Abdominal: Soft. Normal appearance and bowel sounds are normal. He exhibits no distension and no mass. There is tenderness. There is guarding. There is no rigidity and no rebound.  LLQ TTP with guarding. No rebound or palpable mass.  Musculoskeletal: Normal range of motion. He exhibits no edema or tenderness.  Neurological: He is alert and oriented to person, place, and time. He has normal strength.  Skin: Skin is warm and intact. No rash noted. He is diaphoretic. No erythema. No pallor.  Psychiatric: He has a normal mood and affect. His speech is normal and behavior is normal. Judgment and thought content normal.  Nursing note and vitals reviewed.   ED Course  Procedures (including critical care time)  Labs Review Labs Reviewed  CBC - Abnormal; Notable for the following:    WBC 18.1 (*)    MCH 25.3 (*)    RDW 16.9 (*)    All other components within normal  limits  BASIC METABOLIC PANEL - Abnormal; Notable for the following:    CO2 13 (*)    Glucose, Bld 203 (*)    Anion gap 17 (*)    All other components within normal limits  URINALYSIS, ROUTINE W REFLEX MICROSCOPIC (NOT AT [facility]) - Abnormal; Notable for the following:    Glucose, UA 100 (*)    All other components within normal limits  I-STAT CG4 LACTIC ACID, ED - Abnormal; Notable for the following:    Lactic Acid, Venous 2.77 (*)    All other components within normal limits  LIPASE, BLOOD  I-STAT CG4 LACTIC ACID, ED    Imaging Review  Ct Abdomen Pelvis W Contrast 11/05/2014   CLINICAL DATA:  Left lower quadrant abdominal pain the M1 week ago. Vomiting and diarrhea. Recent hospitalization for similar symptoms. History of Crohn's disease. Personal history of subtotal colectomy.  EXAM: CT ABDOMEN AND PELVIS WITH CONTRAST  TECHNIQUE: Multidetector CT imaging of the abdomen and pelvis was performed using the  standard protocol following bolus administration of intravenous contrast.  CONTRAST:  OMNIPAQUE IOHEXOL 300 MG/ML  SOLN  COMPARISON:  CT abdomen and pelvis 10/25/2014.  FINDINGS: Minimal atelectasis is present at the lung bases bilaterally. The previously noted medial right lower lobe nodule is no longer evident. A smaller peripheral nodule posteriorly measures 3 mm on image 7 of series 3. The rapid change suggests this was inflammatory as is the smaller nodule.  The heart size is normal. No significant pleural or pericardial effusion is present.  The liver and spleen are within normal limits. The stomach, duodenum, and pancreas are unremarkable. The common bile duct and gallbladder are normal. The adrenal glands are normal bilaterally. The kidneys and ureters are within normal limits bilaterally. Urinary bladder is unremarkable.  The rectosigmoid colon is within normal limits. The distal anastomosis is unremarkable. There is no evidence for leak. The small bowel is not particularly dilated.  Adhesion is again suggested anteriorly near the umbilicus. The distal small bowel is mostly collapsed. Previously noted partial obstruction at the anastomosis is not evident on today's study.  There is no significant free fluid or adenopathy.  Bone windows are unremarkable. Slight anterolisthesis at L5-S1 is stable.  IMPRESSION: 1. Subtotal colectomy with significant improvement of previously seen thickening and mild dilation at the ileocolonic anastomosis. 2. Slight caliber change in small bowel more proximally suggesting adhesions near the umbilicus. 3. No acute or focal lesion to explain acute pain otherwise. 4. Improved appearance of small nodules in the medial right lower lobe. These are likely inflammatory.   Electronically Signed   By: Marin Roberts M.D.   On: 11/05/2014 16:52    I have personally reviewed and evaluated these images and lab results as part of my medical decision-making.   EKG Interpretation None      MDM   Final diagnoses:  Abdominal pain   29 year old male presents with worsening LLQ pain, nausea, diarrhea. Denies melena or hematochezia. Denies fever, chills, chest pain, shortness of breath, lightheadedness, dizziness, syncope. Reports headache, which he attributes to dehydration. Patient seen in the ED 09/03 and 09/04 for LLQ abdominal pain, nausea, and diarrhea, found to have active crohn's flare. Admitted 09/04 given failed outpatient treatment. Seen again 09/13 with LLQ abdominal pain, vomiting, and diarrhea and was discharged.   Patient is afebrile. Tachycardic to 110s in the ED. Given 1L bolus NS x 2. Will treat pain with dilaudid, nausea with zofran. TTP of LLQ with guarding; no rebound or palpable masses. CBC with leukocytosis of 18.1. Lactic acid elevated at 2.77. BMP with bicarb 13, glucose 203, anion gap 17. Lipase within normal limits. UA negative for infection.   CT abdomen pelvis obtained, negative for acute change. Hospitalist consulted given  patient's inability to tolerate PO intake at home. Spoke with [facility] hospitalist, who will admit the patient for further evaluation and management.  BP 114/62 mmHg  Pulse 76  Temp(Src) 98.4 F (36.9 C) (Oral)  Resp 18  Ht  (1.575 m)  Wt 150 lb (68.04 kg)  BMI 27.43 kg/m2  SpO2 95%     Mady Gemma, PA-C 11/05/14 2019  Doug Sou, MD 11/06/14 [phone]

## 2014-11-05 NOTE — ED Notes (Signed)
MD at bedside. 

## 2014-11-05 NOTE — H&P (Signed)
[facility] History and Physical  Andre [name] GNF:[id number] DOB: 09/27/1985 DOA: 11/05/2014  Referring physician: EDP PCP: No primary care provider on file.   Chief Complaint: Nausea/vomiting and abdominal pain  HPI: Andre [name] is a 29 y.o. male with past medical history of Crohn's disease, had 4 previous abdominal surgery with  SB resection came to the hospital complaining about nausea/vomiting and abdominal pain. Patient reported that he is from [place] and moved to [place], [place] to be close to a prominent GI doctor in [place] [employer], he is visiting a friend. He is been in [place] for one month only already been in the hospital 4 times in 14 days. Patient reported this is his normal, he usually goes to the ED every 1-2 weeks and stays in the hospital about 3-4 days. He was discharged from the hospital on Sunday (3 days ago), reported nausea, vomiting and severe watery nonbloody diarrhea since discharge, he vomits whenever he eats so he came to the hospital for further evaluation. In the ED lactic acid is 2.7, WBCs 18.1 and his bicarbonate is 13 with anion gap of 17. Will be admitted to the hospital for further evaluation.  Review of Systems:  Constitutional: negative for anorexia, fevers and sweats Eyes: negative for irritation, redness and visual disturbance Ears, nose, mouth, throat, and face: negative for earaches, epistaxis, nasal congestion and sore throat Respiratory: negative for cough, dyspnea on exertion, sputum and wheezing Cardiovascular: negative for chest pain, dyspnea, lower extremity edema, orthopnea, palpitations and syncope Gastrointestinal: Nausea, vomiting and diarrhea. Genitourinary:negative for dysuria, frequency and hematuria Hematologic/lymphatic: negative for bleeding, easy bruising and lymphadenopathy Musculoskeletal:negative for arthralgias, muscle weakness and stiff joints Neurological: negative for coordination problems, gait problems,  headaches and weakness Endocrine: negative for diabetic symptoms including polydipsia, polyuria and weight loss Allergic/Immunologic: negative for anaphylaxis, hay fever and urticaria  Past Medical History  Diagnosis Date  . Anxiety   . Bipolar depression   . Crohn disease 1997  . Anemia     Around 2013 required PRBC transfusions. Has received parenteral iron in past. Has taken oral iron in past.   Past Surgical History  Procedure Laterality Date  . Subtotal colectomy      Has undergone a total of 3 separate bowel resections including terminal ileal resection and the equivalent of subtotal colectomy. Last surgery was in 2010.  Marland Kitchen Flexible sigmoidoscopy N/A 10/29/2014    Procedure: FLEXIBLE SIGMOIDOSCOPY;  Surgeon: Ruffin Frederick, MD;  Location: [facility] ENDOSCOPY;  Service: Gastroenterology;  Laterality: N/A;   Social History:   reports that he has been smoking Cigarettes.  He has a 5 pack-year smoking history. He has never used smokeless tobacco. He reports that he does not drink alcohol or use illicit drugs.  Allergies  Allergen Reactions  . Remicade [Infliximab] Anaphylaxis  . Compazine [Prochlorperazine]     agitation  . Lithium     Toxicity     Family history: Father has hypertension and known CAD, deceased.  Prior to Admission medications   Medication Sig Start Date End Date Taking? Authorizing Provider  ALPRAZolam Prudy Feeler) 0.5 MG tablet Take 0.5 mg by mouth 2 (two) times daily.    Historical Provider, MD  azaTHIOprine (IMURAN) 50 MG tablet Take 175 mg by mouth daily.    Historical Provider, MD  gabapentin (NEURONTIN) 300 MG capsule Take 900 mg by mouth 3 (three) times daily.    Historical Provider, MD  hydrocortisone (ANUSOL-HC) 25 MG suppository Place 1 suppository (25 mg total) rectally 2 (two)  times daily as needed (rectal pain). 11/02/14   Renae Fickle, MD  loperamide (IMODIUM) 2 MG capsule Take 4 mg by mouth every 4 (four) hours. 09/22/14   Historical Provider, MD    metroNIDAZOLE (FLAGYL) 500 MG tablet Take 1 tablet (500 mg total) by mouth 3 (three) times daily. 11/02/14   Renae Fickle, MD  ondansetron (ZOFRAN ODT) 4 MG disintegrating tablet Take 1 tablet (4 mg total) by mouth every 8 (eight) hours as needed for nausea or vomiting. 11/02/14   Renae Fickle, MD  oxyCODONE-acetaminophen (PERCOCET/ROXICET) 5-325 MG per tablet Take 1-2 tablets by mouth every 4 (four) hours as needed for severe pain. 11/02/14   Renae Fickle, MD  predniSONE (DELTASONE) 20 MG tablet Take two tabs once daily for 14 days through 9/24.  On 9/25, start one and a half tabs daily thereafter 11/02/14   Renae Fickle, MD  promethazine (PHENERGAN) 25 MG suppository Place 1 suppository (25 mg total) rectally every 6 (six) hours as needed for nausea. 11/05/14   Derwood Kaplan, MD   Physical Exam: Filed Vitals:   11/05/14 1700  BP: 114/62  Pulse: 76  Temp:   Resp:    Constitutional: Oriented to person, place, and time. Well-developed and well-nourished. Cooperative.  Head: Normocephalic and atraumatic.  Nose: Nose normal.  Mouth/Throat: Uvula is midline, oropharynx is clear and moist and mucous membranes are normal.  Eyes: Conjunctivae and EOM are normal. Pupils are equal, round, and reactive to light.  Neck: Trachea normal and normal range of motion. Neck supple.  Cardiovascular: Normal rate, regular rhythm, S1 normal, S2 normal, normal heart sounds and intact distal pulses.   Pulmonary/Chest: Effort normal and breath sounds normal.  Abdominal: Soft. Bowel sounds are normal. There is no hepatosplenomegaly. There is no tenderness.  Musculoskeletal: Normal range of motion.  Neurological: Alert and oriented to person, place, and time. Has normal strength. No cranial nerve deficit or sensory deficit.  Skin: Skin is warm, dry and intact.  Psychiatric: Has a normal mood and affect. Speech is normal and behavior is normal.   Labs on Admission:  Basic Metabolic Panel:  Recent  Labs Lab 10/31/14 0415 11/02/14 0615 11/04/14 2032 11/05/14 1310  NA 140 140 141 139  K 3.0* 3.2* 4.1 4.4  CL 104 112* 109 109  CO2 27 20* 24 13*  GLUCOSE 126* 146* 107* 203*  BUN <5* CREATININE 0.80 0.86 0.94 1.02  CALCIUM 8.1* 8.4* 9.2 9.5   Liver Function Tests:  Recent Labs Lab 11/04/14 2032  AST 42*  ALT 37  ALKPHOS 66  BILITOT 0.7  PROT 7.2  ALBUMIN 4.6    Recent Labs Lab 11/04/14 2032 11/05/14 1310  LIPASE 18* 22   No results for input(s): AMMONIA in the last 168 hours. CBC:  Recent Labs Lab 10/30/14 0355 11/04/14 2032 11/05/14 1310  WBC 12.2* 13.7* 18.1*  HGB 11.0* 14.0 13.7  HCT 36.1* 45.9 43.2  MCV 78.1 80.7 79.7  PLT 175 258 229   Cardiac Enzymes: No results for input(s): CKTOTAL, CKMB, CKMBINDEX, TROPONINI in the last 168 hours.  BNP (last 3 results) No results for input(s): BNP in the last 8760 hours.  ProBNP (last 3 results) No results for input(s): PROBNP in the last 8760 hours.  CBG: No results for input(s): GLUCAP in the last 168 hours.  Radiological Exams on Admission: Ct Abdomen Pelvis W Contrast  11/05/2014   CLINICAL DATA:  Left lower quadrant abdominal pain the M1 week ago. Vomiting  and diarrhea. Recent hospitalization for similar symptoms. History of Crohn's disease. Personal history of subtotal colectomy.  EXAM: CT ABDOMEN AND PELVIS WITH CONTRAST  TECHNIQUE: Multidetector CT imaging of the abdomen and pelvis was performed using the standard protocol following bolus administration of intravenous contrast.  CONTRAST:  OMNIPAQUE IOHEXOL 300 MG/ML  SOLN  COMPARISON:  CT abdomen and pelvis 10/25/2014.  FINDINGS: Minimal atelectasis is present at the lung bases bilaterally. The previously noted medial right lower lobe nodule is no longer evident. A smaller peripheral nodule posteriorly measures 3 mm on image 7 of series 3. The rapid change suggests this was inflammatory as is the smaller nodule.  The heart size is normal.  No significant pleural or pericardial effusion is present.  The liver and spleen are within normal limits. The stomach, duodenum, and pancreas are unremarkable. The common bile duct and gallbladder are normal. The adrenal glands are normal bilaterally. The kidneys and ureters are within normal limits bilaterally. Urinary bladder is unremarkable.  The rectosigmoid colon is within normal limits. The distal anastomosis is unremarkable. There is no evidence for leak. The small bowel is not particularly dilated. Adhesion is again suggested anteriorly near the umbilicus. The distal small bowel is mostly collapsed. Previously noted partial obstruction at the anastomosis is not evident on today's study.  There is no significant free fluid or adenopathy.  Bone windows are unremarkable. Slight anterolisthesis at L5-S1 is stable.  IMPRESSION: 1. Subtotal colectomy with significant improvement of previously seen thickening and mild dilation at the ileocolonic anastomosis. 2. Slight caliber change in small bowel more proximally suggesting adhesions near the umbilicus. 3. No acute or focal lesion to explain acute pain otherwise. 4. Improved appearance of small nodules in the medial right lower lobe. These are likely inflammatory.   Electronically Signed   By: Marin Roberts M.D.   On: 11/05/2014 16:52   Dg Abd Acute W/chest  11/05/2014   CLINICAL DATA:  29 year old male with left-sided abdominal pain nausea and vomiting history of Crohn's disease  EXAM: DG ABDOMEN ACUTE W/ 1V CHEST  COMPARISON:  MRI dated 10/31/2014 and CT dated 10/25/2014  FINDINGS: There is no bowel obstruction or dilatation. No radiopaque calculi identified. Multiple surgical clips noted in the left upper abdomen. Bowel anastomotic sutures noted in the pelvis. The osseous structures appear unremarkable.  IMPRESSION: No evidence of bowel obstruction.  No free air.   Electronically Signed   By: Elgie Collard M.D.   On: 11/05/2014 02:11    EKG:  Independently reviewed.   Assessment/Plan Principal Problem:   Acute Crohn's disease Active Problems:   Refractory nausea and vomiting   Abdominal pain   Metabolic acidosis, increased anion gap (IAG)    Crohn's disease flareup -Patient did present to the hospital nausea, vomiting and abdominal pain as well as diarrhea. -Very uncontrolled Crohn's disease, GI to help. -Reported this as symptoms she gets with acute Crohn's disease flares. -Started on high-dose Solu-Medrol, continue his Imuran.  Refractory nausea and vomiting -Secondary to Crohn's disease flareup, treat symptomatically with the Zofran. -Patient also has some abdominal pain.  Metabolic acidosis -This is likely secondary to diarrhea, hopefully will improve when the diarrhea improves.  Abdominal pain -Patient has multiple psychiatric problems including bipolar affective disorder and anxiety. -Patient being only in [place] for the past 14 days he's been in the emergency department 4 times admitted twice. -I wonder if his exaggerating his symptoms/pain, will avoid narcotics for pain control.   Code Status: Full code Family  Communication: Plan discussed with the patient Disposition Plan: Inpatient  Time spent: 70 minutes  Rifky Lapre A, MD [facility] Pager [phone]

## 2014-11-05 NOTE — Discharge Instructions (Signed)
We saw you in the ER for the abdominal pain.  All the results in the ER are normal, labs and imaging. The pain is likely crohns flare up, but without complications. As we discussed, we try to avoid CT scan in youg patients like you to reduce chance of future cancers- so for your benefit, we havent taken the drastic steps given all the labs are looking reassuring and you were able to tolerate fluids.  The workup in the ER is not complete, and is limited to screening for life threatening and emergent conditions only. Please return to the ER if your symptoms worsen; you have increased pain, fevers, chills, bloody stools, inability to keep any medications down, confusion.   Crohn Disease Crohn disease is a long-term (chronic) soreness and redness (inflammation) of the intestines (bowel). It can affect any portion of the digestive tract, from the mouth to the anus. It can also cause problems outside the digestive tract. Crohn disease is closely related to a disease called ulcerative colitis (together, these two diseases are called inflammatory bowel disease).  CAUSES  The cause of Crohn disease is not known. One Nelva Bush is that, in an easily affected person, the immune system is triggered to attack the body's own digestive tissue. Crohn disease runs in families. It seems to be more common in certain geographic areas and amongst certain races. There are no clear-cut dietary causes.  SYMPTOMS  Crohn disease can cause many different symptoms since it can affect many different parts of the body. Symptoms include:  Fatigue.  Weight loss.  Chronic diarrhea, sometime bloody.  Abdominal pain and cramps.  Fever.  Ulcers or canker sores in the mouth or rectum.  Anemia (low red blood cells).  Arthritis, skin problems, and eye problems may occur. Complications of Crohn disease can include:  Series of holes (perforation) of the bowel.  Portions of the intestines sticking to each other  (adhesions).  Obstruction of the bowel.  Fistula formation, typically in the rectal area but also in other areas. A fistula is an opening between the bowels and the outside, or between the bowels and another organ.  A painful crack in the mucous membrane of the anus (rectal fissure). DIAGNOSIS  Your caregiver may suspect Crohn disease based on your symptoms and an exam. Blood tests may confirm that there is a problem. You may be asked to submit a stool specimen for examination. X-rays and CT scans may be necessary. Ultimately, the diagnosis is usually made after a procedure that uses a flexible tube that is inserted via your mouth or your anus. This is done under sedation and is called either an upper endoscopy or colonoscopy. With these tests, the specialist can take tiny tissue samples and remove them from the inside of the bowel (biopsy). Examination of this biopsy tissue under a microscope can reveal Crohn disease as the cause of your symptoms. Due to the many different forms that Crohn disease can take, symptoms may be present for several years before a diagnosis is made. TREATMENT  Medications are often used to decrease inflammation and control the immune system. These include medicines related to aspirin, steroid medications, and newer and stronger medications to slow down the immune system. Some medications may be used as suppositories or enemas. A number of other medications are used or have been studied. Your caregiver will make specific recommendations. HOME CARE INSTRUCTIONS   Symptoms such as diarrhea can be controlled with medications. Avoid foods that have a laxative effect such as  fresh fruit, vegetables, and dairy products. During flare-ups, you can rest your bowel by refraining from solid foods. Drink clear liquids frequently during the day. (Electrolyte or rehydrating fluids are best. Your caregiver can help you with suggestions.) Drink often to prevent loss of body fluids  (dehydration). When diarrhea has cleared, eat small meals and more frequently. Avoid food additives and stimulants such as caffeine (coffee, tea, or chocolate). Enzyme supplements may help if you develop intolerance to a sugar in dairy products (lactose). Ask your caregiver or dietitian about specific dietary instructions.  Try to maintain a positive attitude. Learn relaxation techniques such as self-hypnosis, mental imaging, and muscle relaxation.  If possible, avoid stresses which can aggravate your condition.  Exercise regularly.  Follow your diet.  Always get plenty of rest. SEEK MEDICAL CARE IF:   Your symptoms fail to improve after a week or two of new treatment.  You experience continued weight loss.  You have ongoing cramps or loose bowels.  You develop a new skin rash, skin sores, or eye problems. SEEK IMMEDIATE MEDICAL CARE IF:   You have worsening of your symptoms or develop new symptoms.  You have a fever.  You develop bloody diarrhea.  You develop severe abdominal pain. MAKE SURE YOU:   Understand these instructions.  Will watch your condition.  Will get help right away if you are not doing well or get worse. Document Released: 11/17/2004 Document Revised: 06/24/2013 Document Reviewed: 10/16/2006 [facility] Patient Information 2015 [place], [place]. This information is not intended to replace advice given to you by your health care provider. Make sure you discuss any questions you have with your health care provider.

## 2014-11-05 NOTE — ED Notes (Signed)
Had crohn's flair last week-- was admitted to the hospital, discharged Sunday-- now unable to keep anything down.

## 2014-11-05 NOTE — ED Notes (Signed)
Pt provided with a urinal. Told to call if he had a sample.

## 2014-11-05 NOTE — ED Notes (Signed)
MD at bedside at this time.

## 2014-11-05 NOTE — ED Notes (Signed)
Received call from lab requesting recollect on urine sample d/t mislabel.

## 2014-11-06 DIAGNOSIS — E872 Acidosis: Secondary | ICD-10-CM

## 2014-11-06 DIAGNOSIS — K50918 Crohn's disease, unspecified, with other complication: Secondary | ICD-10-CM

## 2014-11-06 DIAGNOSIS — R109 Unspecified abdominal pain: Secondary | ICD-10-CM | POA: Diagnosis not present

## 2014-11-06 DIAGNOSIS — R112 Nausea with vomiting, unspecified: Secondary | ICD-10-CM | POA: Diagnosis not present

## 2014-11-06 DIAGNOSIS — R1084 Generalized abdominal pain: Secondary | ICD-10-CM | POA: Diagnosis not present

## 2014-11-06 DIAGNOSIS — K5090 Crohn's disease, unspecified, without complications: Secondary | ICD-10-CM | POA: Diagnosis not present

## 2014-11-06 LAB — C DIFFICILE QUICK SCREEN W PCR REFLEX
C Diff antigen: NEGATIVE
C Diff interpretation: NEGATIVE
C Diff toxin: NEGATIVE

## 2014-11-06 LAB — C-REACTIVE PROTEIN: CRP: 1.4 mg/dL — ABNORMAL HIGH (ref <1.0)

## 2014-11-06 LAB — SEDIMENTATION RATE: Sed Rate: 7 mm/hr (ref 0–16)

## 2014-11-06 MED ORDER — METHYLPREDNISOLONE SODIUM SUCC 40 MG IJ SOLR
40.0000 mg | INTRAMUSCULAR | Status: DC
  Administered 2014-11-07: 40 mg via INTRAVENOUS
  Filled 2014-11-06: qty 1

## 2014-11-06 MED ORDER — AMITRIPTYLINE HCL 50 MG PO TABS
25.0000 mg | ORAL_TABLET | Freq: Every day | ORAL | Status: DC
  Administered 2014-11-06: 25 mg via ORAL
  Filled 2014-11-06: qty 1

## 2014-11-06 MED ORDER — METHYLPREDNISOLONE SODIUM SUCC 125 MG IJ SOLR: 60.0000 mg | INTRAMUSCULAR | Status: DC

## 2014-11-06 MED ORDER — HYDROMORPHONE HCL 1 MG/ML IJ SOLN
0.5000 mg | INTRAMUSCULAR | Status: DC | PRN
  Administered 2014-11-06 – 2014-11-07 (×5): 0.5 mg via INTRAVENOUS
  Filled 2014-11-06 (×5): qty 1

## 2014-11-06 MED ORDER — DICYCLOMINE HCL 20 MG PO TABS: 20.0000 mg | ORAL_TABLET | Freq: Three times a day (TID) | ORAL | Status: DC | PRN

## 2014-11-06 NOTE — Consult Note (Signed)
[facility] Consult: 10:37 AM 11/06/2014  LOS: 1 day    Referring Provider: Dr Isidoro Donning  Primary Care Physician:  No primary care provider on file.  Pt from out of town.  Primary Gastroenterologist:  Gentry Fitz. Dr. Kyla Balzarine [phone], She works through [employer] GI service and out pt clinic.     Reason for Consultation:  N/v/diarrhea in Crohn's pt.    HPI: Andre [name] is a 29 y.o. male. He [place] in [place], [place]. He is here visiting a friend in [place]. PMH long-standing Crohn's disease since age 29. He has undergone a total of 3 separate bowel resections resulting in subtotal colectomy with what sounds like terminal ileal resection. He has never had an ostomy.. Bipolar disorder/anxiety.  Patient says he's been on prednisone most of his life. His last lower endoscopy as well as an EGD were performed around March 2016. At the time they noted mild inflammation in the region of the anastomosis and some small bowel stricturing short while later he was hospitalized with a IBD flare. About 2 months ago his prednisone was increased from 20 mg daily to 40 mg daily. This medication increase was due to CT findings showing increased anastomosis inflammation, however the patient wasn't having clinical flare. Also takes 175 mg Imuran daily. Patient had anaphylaxis to Remicade. Both Humira and symptoms he had caused bad rash and were not effective. His GI doctor is in the process of trying to get him approved to start on Entyvio.   Just discharged 9/11 following 8 day admission for flare of long-standing Crohn's.  Treated with IV solumedrol 2 CT scans (9/3, 9/14) confirmed subtotal colectomy, ileocolonic anastomotic inflammation which had significantly  improved on second CT.  Second CT suggested umbilical adhesions.  MR enterography 9/9: persistent but improved SB dilatation, stable ileocolic mesenteric inflammation, resolution of RLQ fluid Though CT scan and MR did show SB dilatation, neither these or AAS films confirmed obstruction.   Flex sig 9/7: anal stricture and ulcerations. These were felt to be cause of rectal bleeding. Findings did not explain level of abd pain. Dr Adela Lank did not suspect CMV.  CMV DNA test negative.  Stool for c diff and ova/parasites negative.   Was tolerating solid po and stool frequency diminished, pain tolerable with po percocet, Discharged on Azathiprine, Flagyl for 10 days, prednisone 40 mg (same dose as at admission), prn zofran and percocet Was to fly to [place] area on 9/16 to see his GI specialist. Hope was to initiate Entyvio, pending insurance approval/clearance.   Now returns last PM with ongoing n/v/ watery diarrhea every 1 to 2 hours.  No bloody stool.  Unable to tolerate po. Started 48 hours after  Lactic acid 2.7, WBCs 18.1, bicarb 13 with anion gap 17.   Note pt was belligerent last night and pulled out IV, threatened AMA departure so he could return to ED all because he refused Toradol, which he knows can flare his disease.  Refused Levsin, Toradol, Lovenox and refused to have IV  replaced.   Currently agreeable to replacing the IV and behaving.       Past Medical History  Diagnosis Date  . Anxiety   . Bipolar depression   . Crohn disease 1997  . Anemia     Around 2013 required PRBC transfusions. Has received parenteral iron in past. Has taken oral iron in past.    Past Surgical History  Procedure Laterality Date  . Subtotal colectomy      Has undergone a total of 3 separate bowel resections including terminal ileal resection and the equivalent of subtotal colectomy. Last surgery was in 2010.  Marland Kitchen Flexible sigmoidoscopy N/A 10/29/2014    Procedure: FLEXIBLE SIGMOIDOSCOPY;  Surgeon: Ruffin Frederick, MD;  Location[facility] ENDOSCOPY;  Service: Gastroenterology;  Laterality: N/A;    Prior to Admission medications   Medication Sig Start Date End Date Taking? Authorizing Provider  ALPRAZolam Prudy Feeler) 0.5 MG tablet Take 0.5 mg by mouth 2 (two) times daily.   Yes Historical Provider, MD  azaTHIOprine (IMURAN) 50 MG tablet Take 175 mg by mouth daily.   Yes Historical Provider, MD  Ferrous Sulfate (IRON) 325 (65 FE) MG TABS Take 325 mg by mouth daily.   Yes Historical Provider, MD  gabapentin (NEURONTIN) 300 MG capsule Take 900 mg by mouth 3 (three) times daily.   Yes Historical Provider, MD  hydrocortisone (ANUSOL-HC) 25 MG suppository Place 1 suppository (25 mg total) rectally 2 (two) times daily as needed (rectal pain). 11/02/14  Yes Renae Fickle, MD  loperamide (IMODIUM) 2 MG capsule Take 4 mg by mouth every 4 (four) hours. 09/22/14  Yes Historical Provider, MD  Magnesium 300 MG CAPS Take 300 mg by mouth daily.   Yes Historical Provider, MD  metroNIDAZOLE (FLAGYL) 500 MG tablet Take 1 tablet (500 mg total) by mouth 3 (three) times daily. 11/02/14  Yes Renae Fickle, MD  ondansetron (ZOFRAN ODT) 4 MG disintegrating tablet Take 1 tablet (4 mg total) by mouth every 8 (eight) hours as needed for nausea or vomiting. 11/02/14  Yes Renae Fickle, MD  oxyCODONE-acetaminophen (PERCOCET/ROXICET) 5-325 MG per tablet Take 1-2 tablets by mouth every 4 (four) hours as needed for severe pain. 11/02/14  Yes Renae Fickle, MD  predniSONE (DELTASONE) 20 MG tablet Take two tabs once daily for 14 days through 9/24.  On 9/25, start one and a half tabs daily thereafter 11/02/14  Yes Renae Fickle, MD    Scheduled Meds: . azaTHIOprine  175 mg Oral Daily  . enoxaparin (LOVENOX) injection  40 mg Subcutaneous Q24H  . gabapentin  900 mg Oral TID  . hyoscyamine  0.25 mg Sublingual Once  . methylPREDNISolone (SOLU-MEDROL) injection  80 mg Intravenous 3 times per day   Infusions: . dextrose 5 % and 0.45  % NaCl with KCl 10 mEq/L 100 mL/hr at 11/05/14 2102   PRN Meds: acetaminophen **OR** acetaminophen, HYDROcodone-acetaminophen, ketorolac, ondansetron **OR** ondansetron (ZOFRAN) IV   Allergies as of 11/05/2014 - Review Complete 11/05/2014  Allergen Reaction Noted  . Remicade [infliximab] Anaphylaxis 10/25/2014  . Compazine [prochlorperazine]  10/25/2014  . Ketorolac  11/05/2014  . Lithium  10/25/2014  . Morphine  11/05/2014  . Nsaids  11/05/2014  . Ciprofloxacin Rash 11/05/2014  . Metronidazole Rash 11/05/2014    Family History  Problem Relation Age of Onset  . Hypertension Father     Social History   Social History  . Marital Status: Single    Spouse Name: N/A  . Number of Children: N/A  . Years  of Education: N/A   Occupational History  . Not on file.   Social History Main Topics  . Smoking status: Current Every Day Smoker -- 1.00 packs/day for 5 years    Types: Cigarettes  . Smokeless tobacco: Never Used  . Alcohol Use: No  . Drug Use: No  . Sexual Activity: Not on file   Other Topics Concern  . Not on file   Social History Narrative    REVIEW OF SYSTEMS:    PHYSICAL EXAM: Vital signs in last 24 hours: Filed Vitals:   11/06/14 0545  BP: 103/59  Pulse: 76  Temp: 97.9 F (36.6 C)  Resp: 20   Wt Readings from Last 3 Encounters:  11/05/14 150 lb (68.04 kg)  11/04/14 150 lb (68.04 kg)  11/01/14 158 lb 4.6 oz (71.8 kg)   General: Somewhat cushingoid, pale, comfortable WN. Head: Slight cushingoid facies. No asymmetry, no signs of head trauma.  Eyes: No conjunctival pallor or scleral icterus. EOMI. Ears: No hearing deficits  Nose: No nasal congestion no nasal discharge Mouth: Moist, clear oral mucosa. Fair dentition. Neck: No JVD, no TMG, no masses Lungs: Clear bilaterally. No labored breathing or cough. Heart: RRR. No MRG. S1/S2 audible. Abdomen: Soft. ND, slight mid abominal tenderness.Hypoactive but not tympanitic or tinkling BS.    Rectal: Deferred.  Musc/Skeltl: No joint swelling, contracture deformity or redness Extremities: No CCE.  Neurologic: Oriented 3, moves all 4 limbs. Limb strength not tested. Skin: No rash, no sores,  Tattoos: None seen Nodes: No cervical adenopathy.  Psych: calm, cooperative.  Intake/Output from previous day:   Intake/Output this shift:    LAB RESULTS:  Recent Labs  11/04/14 2032 11/05/14 1310  WBC 13.7* 18.1*  HGB 14.0 13.7  HCT 45.9 43.2  PLT 258 229   BMET Lab Results  Component Value Date   NA 139 11/05/2014   NA 141 11/04/2014   NA 140 11/02/2014   K 4.4 11/05/2014   K 4.1 11/04/2014   K 3.2* 11/02/2014   CL 109 11/05/2014   CL 109 11/04/2014   CL 112* 11/02/2014   CO2 13* 11/05/2014   CO2 24 11/04/2014   CO2 20* 11/02/2014   GLUCOSE 203* 11/05/2014   GLUCOSE 107* 11/04/2014   GLUCOSE 146* 11/02/2014   BUN 11 11/05/2014   BUN 13 11/04/2014   BUN 9 11/02/2014   CREATININE 1.02 11/05/2014   CREATININE 0.94 11/04/2014   CREATININE 0.86 11/02/2014   CALCIUM 9.5 11/05/2014   CALCIUM 9.2 11/04/2014   CALCIUM 8.4* 11/02/2014   LFT  Recent Labs  11/04/14 2032  PROT 7.2  ALBUMIN 4.6  AST 42*  ALT 37  ALKPHOS 66  BILITOT 0.7   PT/INR No results found for: INR, PROTIME Hepatitis Panel No results for input(s): HEPBSAG, HCVAB, HEPAIGM, HEPBIGM in the last 72 hours. C-Diff No components found for: CDIFF Lipase     Component Value Date/Time   LIPASE 22 11/05/2014 1310    Drugs of Abuse  No results found for: LABOPIA, COCAINSCRNUR, LABBENZ, AMPHETMU, THCU, LABBARB   RADIOLOGY STUDIES: Ct Abdomen Pelvis W Contrast  11/05/2014   CLINICAL DATA:  Left lower quadrant abdominal pain the M1 week ago. Vomiting and diarrhea. Recent hospitalization for similar symptoms. History of Crohn's disease. Personal history of subtotal colectomy.  EXAM: CT ABDOMEN AND PELVIS WITH CONTRAST  TECHNIQUE: Multidetector CT imaging of the abdomen and  pelvis was performed using the standard protocol following bolus administration of intravenous contrast.  CONTRAST:  OMNIPAQUE IOHEXOL 300 MG/ML  SOLN  COMPARISON:  CT abdomen and pelvis 10/25/2014.  FINDINGS: Minimal atelectasis is present at the lung bases bilaterally. The previously noted medial right lower lobe nodule is no longer evident. A smaller peripheral nodule posteriorly measures 3 mm on image 7 of series 3. The rapid change suggests this was inflammatory as is the smaller nodule.  The heart size is normal. No significant pleural or pericardial effusion is present.  The liver and spleen are within normal limits. The stomach, duodenum, and pancreas are unremarkable. The common bile duct and gallbladder are normal. The adrenal glands are normal bilaterally. The kidneys and ureters are within normal limits bilaterally. Urinary bladder is unremarkable.  The rectosigmoid colon is within normal limits. The distal anastomosis is unremarkable. There is no evidence for leak. The small bowel is not particularly dilated. Adhesion is again suggested anteriorly near the umbilicus. The distal small bowel is mostly collapsed. Previously noted partial obstruction at the anastomosis is not evident on today's study.  There is no significant free fluid or adenopathy.  Bone windows are unremarkable. Slight anterolisthesis at L5-S1 is stable.  IMPRESSION: 1. Subtotal colectomy with significant improvement of previously seen thickening and mild dilation at the ileocolonic anastomosis. 2. Slight caliber change in small bowel more proximally suggesting adhesions near the umbilicus. 3. No acute or focal lesion to explain acute pain otherwise. 4. Improved appearance of small nodules in the medial right lower lobe. These are likely inflammatory.   Electronically Signed   By: Marin Roberts M.D.   On: 11/05/2014 16:52   Dg Abd Acute W/chest  11/05/2014   CLINICAL DATA:  29 year old male with left-sided abdominal pain  nausea and vomiting history of Crohn's disease  EXAM: DG ABDOMEN ACUTE W/ 1V CHEST  COMPARISON:  MRI dated 10/31/2014 and CT dated 10/25/2014  FINDINGS: There is no bowel obstruction or dilatation. No radiopaque calculi identified. Multiple surgical clips noted in the left upper abdomen. Bowel anastomotic sutures noted in the pelvis. The osseous structures appear unremarkable.  IMPRESSION: No evidence of bowel obstruction.  No free air.   Electronically Signed   By: Elgie Collard M.D.   On: 11/05/2014 02:11    ENDOSCOPIC STUDIES: Per HPI  IMPRESSION:   *  Refractory steroid dependent Crohn's. ?adhesive disease and concomittant IBS  *  Elevated WBCs, may be due to the steroids.    *  Steroid induced hyperglycemia/DM    PLAN:     *  Get IV replaced, initiate Solumedrol but not at the large dose currently ordered, back dose off to 40 mg daily Stop Toradol. Stopped the stool path panel  Jennye Moccasin  11/06/2014, 10:37 AM Pager: [phone]   Attending Addendum: I have taken an interval history, reviewed the chart, and examined the patient. I agree with the Advanced Practitioner's note and impression. I know this patient from his last admission. Longstanding Crohns with multiple surgeries. Did not tolerate anti-TNFs or they failed historically. Followed at [facility] for his Crohns. On thiopurine monotherapy and multiple courses of steroids recently. Planned to start [facility] in [place] but flared while visiting this area. Recently admitted for a week and placed on IV steroids. Flex sig showed good control of colon and anastomosis with normal appearing small bowel. CMV testing negative. He eventually improved and was discharged. Now back with recurrence of pain and vomiting, along with frequent stools. Pain and vomiting is what brought him back. CT in ER shows no obstructive pathology, but some  evidence of adhesive changes. C diff testing negative. He has had multiple surgeries and at this time  unclear if his symptoms are due to Crohns or intermittent adhesive symptoms, or if he has concomittant IBS. While he clearly has Crohns, last endoscopy of his distal bowel looked but had some more proximal inflammation. His inflammatory markers previously were normal. Will recheck inflammatory markers. I would also like to obtain thiopurine metabolite levels to see if they are therapeutic. Ultimately he does need to be on Entyvio and off steroids however this is difficult to manage as he is from out of town and unsure if his insurance would cover it. Overall I discussed with him the need to come off steroids, as long term this is not a good management strategy and he will develop side effects and Crohns will continue to be poorly controlled. For now, will bowel rest him, check inflammatory markers, and start him on Elavil  qHS to help diarrhea and use bentyl PRN. Will try to minimize and stop narcotics and transition to oral prednisone and wean. Unclear if this pain is functional, adhesive, or Crohns, will await course.   Ileene Patrick, MD [facility] Pager [phone]                                                          -

## 2014-11-06 NOTE — Progress Notes (Signed)
Patient has been requesting Dilaudid iv for pain.  On call contacted, order for Levsin 0.25 sublingual was given.  Patient became irate, ripped out his iv and threatened to leave to return to the ED.  Then he calmed down after RN spoke with him and he decided to stay.  He however refuses to have another iv placed, he refuses Levsin, Toradol, and Lovenox.  Charge and [facility] informed of situation. Information passed on Jun 30, 2298 staff to follow up.

## 2014-11-06 NOTE — Progress Notes (Signed)
Utilization review completed. Grove Defina, RN, BSN. 

## 2014-11-06 NOTE — Care Management Note (Addendum)
Case Management Note  Patient Details  Name: Andre [name] MRN: [id number] Date of Birth: [birth year]  Subjective/Objective:                 Spoke with patient at bedside. He states he is from [place], however he is originally from "this area.". He moved back to [place] 10-23-14 and is not getting along with his girlfriend and is living with a friend at this time. He states that he is unsure if he will stay or go back to [place]. He states that he has kids that live in [place] with their mom. He states that he previously received care from Dr. Carollee Herter out of [facility] and [facility] in [place]. It is associated with [place]. He states that he gets his medication from [facility] and does not have any difficulties getting or paying for his medication. He states that he does not drive but he feels comfortable taking the bus as it is cheaper and easier than having a car."  Patient agreeable to establishing care at [facility] after discharge.  11-07-14 Patient received pamphlet from [facility]. CM explained to patient that they may use the on site pharmacy to fill prescriptions given to them at discharge. Patient aware that the [facility] pharmacy will not fill narcotics or pain medications prior to the patient being seen by one of their physicians.  Patient aware that they must be seen as a patient prior to the pharmacy filling the prescriptions a second time.    Action/Plan:  Patient denied any assistance from CM, no needs seen at [facility] stime. Will continue to monitor as offer resources as needed.   Expected Discharge Date:                  Expected Discharge Plan:  Home/Self Care  In-House Referral:     Discharge planning Services  CM Consult  Post Acute Care Choice:    Choice offered to:     DME Arranged:    DME Agency:     HH Arranged:    HH Agency:     Status of Service:  In process, will continue to follow  Medicare Important Message  Given:    Date Medicare IM Given:    Medicare IM give by:    Date Additional Medicare IM Given:    Additional Medicare Important Message give by:     If discussed at Long Length of Stay Meetings, dates discussed:    Additional Comments:  Lawerance Sabal, RN 11/06/2014, 4:12 PM

## 2014-11-06 NOTE — Progress Notes (Signed)
[facility]                                                                              Patient Demographics  Andre [name], is a 29 y.o. male, DOB - 12-27-1985, WUJ:[id number]  Admit date - 11/05/2014   Admitting Physician Clydia Llano, MD  Outpatient Primary MD for the patient is No primary care provider on file.  LOS - 1   Chief Complaint  Patient presents with  . Crohn's Disease  . Emesis  . Diarrhea       Brief HPI    Andre [name] is a 29 y.o. male with past medical history of Crohn's disease, had 4 previous abdominal surgery with SB resection came to the hospital complaining about nausea/vomiting and abdominal pain. Patient reported that he is from [place] and moved to [place], [place] to be close to a prominent GI doctor in [employer], he is visiting a friend. He has been in [place] for one month only, been in the hospital 4 times in 14 days. Patient reported this is his normal, he usually goes to the ED every 1-2 weeks and stays in the hospital about 3-4 days. He was discharged from the hospital on Sunday (3 days ago), reported nausea, vomiting and severe watery nonbloody diarrhea since discharge, he vomits whenever he eats so he came to the hospital for further evaluation. In the ED lactic acid is 2.7, WBCs 18.1 and his bicarbonate is 13 with anion gap of 17. Will be admitted to the hospital for further evaluation.   Assessment & Plan    Principal Problem:   Acute Crohn's disease flare/abdominal pain/diarrhea - Patient reports long-standing history of Crohn's disease and was diagnosed in 2009, has been [facility], has undergone bowel resection, is on immunosuppressants. - Continue IV Solu-Medrol, Imuran, gastroenterology consulted - For now place on clear liquid diet, gentle hydration, stool studies including C. Difficile negative - Discontinue NSAIDs. Continue pain control - CT abdomen and  pelvis showed subtotal colectomy with significant improvement of the previously seen thickening, slight caliber change in the small bowel more proximally suggesting adhesions near the umbilicus, no acute or focal lesion.  Active Problems:   Refractory nausea and vomiting - Continue IV fluids, clear liquid diet, antiemetics    Metabolic acidosis, increased anion gap (IAG) likely due to lactic acidosis - Continue IV fluid hydration   Code Status: *Full code  Family Communication: Discussed in detail with the patient, all imaging results, lab results explained to the patient    Disposition Plan: Per GI  Time Spent in minutes  25 minutes  Procedures  CT abdomen  Consults   Gastroenterology  DVT Prophylaxis  SCDs  Medications  Scheduled Meds: . azaTHIOprine  175 mg Oral Daily  . enoxaparin (LOVENOX) injection  40 mg Subcutaneous Q24H  . gabapentin  900 mg Oral TID  . hyoscyamine  0.25 mg Sublingual Once  . [START ON 11/07/2014] methylPREDNISolone (SOLU-MEDROL) injection  60 mg Intravenous Q24H   Continuous Infusions: . dextrose 5 % and 0.45 % NaCl with KCl 10 mEq/L 100 mL/hr at 11/05/14 2102   PRN  Meds:.acetaminophen **OR** acetaminophen, HYDROcodone-acetaminophen, HYDROmorphone (DILAUDID) injection, ondansetron **OR** ondansetron (ZOFRAN) IV   Antibiotics   Anti-infectives    None        Subjective:   Andre [name] was seen and examined today. Multiple complaints, abdominal pain, does not want Lovenox, upset about Toradol, diarrhea. Patient denies dizziness, chest pain, shortness of breath,numbess, tingling. Patient pulled his IV out overnight.  Objective:   Blood pressure 103/59, pulse 76, temperature 97.9 F (36.6 C), temperature source Oral, resp. rate 20, height 5\' 2"  (1.575 m), weight 68.04 kg (150 lb), SpO2 100 %.  Wt Readings from Last 3 Encounters:  11/05/14 68.04 kg (150 lb)  11/04/14 68.04 kg (150 lb)  11/01/14 71.8 kg (158 lb 4.6 oz)      Intake/Output Summary (Last 24 hours) at 11/06/14 1339 Last data filed at 11/06/14 0900  Gross per 24 hour  Intake      0 ml  Output      0 ml  Net      0 ml    Exam  General: Alert and oriented x 3, NAD  HEENT:  PERRLA, EOMI, Anicteric Sclera, mucous membranes moist.   Neck: Supple, no JVD, no masses  CVS: S1 S2 auscultated, no rubs, murmurs or gallops. Regular rate and rhythm.  Respiratory: Clear to auscultation bilaterally, no wheezing, rales or rhonchi  Abdomen: Soft, mild diffuse tenderness, hypoactive, surgical scar  Ext: no cyanosis clubbing or edema  Neuro: AAOx3, Cr N's II- XII. Strength 5/5 upper and lower extremities bilaterally  Skin: No rashes  Psych: Normal affect and demeanor, alert and oriented x3    Data Review   Micro Results Recent Results (from the past 240 hour(s))  Ova and parasite examination     Status: None   Collection Time: 10/30/14  3:15 PM  Result Value Ref Range Status   Specimen Description PERIRECTAL  Final   Special Requests NONE  Final   Ova and parasites   Final    NO OVA OR PARASITES SEEN Performed at [facility]    Report Status 10/31/2014 FINAL  Final  C difficile quick scan w PCR reflex     Status: None   Collection Time: 11/06/14 11:19 AM  Result Value Ref Range Status   C Diff antigen NEGATIVE NEGATIVE Final   C Diff toxin NEGATIVE NEGATIVE Final   C Diff interpretation Negative for toxigenic C. difficile  Final    Radiology Reports Ct Abdomen Pelvis W Contrast  11/05/2014   CLINICAL DATA:  Left lower quadrant abdominal pain the M1 week ago. Vomiting and diarrhea. Recent hospitalization for similar symptoms. History of Crohn's disease. Personal history of subtotal colectomy.  EXAM: CT ABDOMEN AND PELVIS WITH CONTRAST  TECHNIQUE: Multidetector CT imaging of the abdomen and pelvis was performed using the standard protocol following bolus administration of intravenous contrast.  CONTRAST:  OMNIPAQUE  IOHEXOL 300 MG/ML  SOLN  COMPARISON:  CT abdomen and pelvis 10/25/2014.  FINDINGS: Minimal atelectasis is present at the lung bases bilaterally. The previously noted medial right lower lobe nodule is no longer evident. A smaller peripheral nodule posteriorly measures 3 mm on image 7 of series 3. The rapid change suggests this was inflammatory as is the smaller nodule.  The heart size is normal. No significant pleural or pericardial effusion is present.  The liver and spleen are within normal limits. The stomach, duodenum, and pancreas are unremarkable. The common bile duct and gallbladder are normal. The adrenal glands are normal  bilaterally. The kidneys and ureters are within normal limits bilaterally. Urinary bladder is unremarkable.  The rectosigmoid colon is within normal limits. The distal anastomosis is unremarkable. There is no evidence for leak. The small bowel is not particularly dilated. Adhesion is again suggested anteriorly near the umbilicus. The distal small bowel is mostly collapsed. Previously noted partial obstruction at the anastomosis is not evident on today's study.  There is no significant free fluid or adenopathy.  Bone windows are unremarkable. Slight anterolisthesis at L5-S1 is stable.  IMPRESSION: 1. Subtotal colectomy with significant improvement of previously seen thickening and mild dilation at the ileocolonic anastomosis. 2. Slight caliber change in small bowel more proximally suggesting adhesions near the umbilicus. 3. No acute or focal lesion to explain acute pain otherwise. 4. Improved appearance of small nodules in the medial right lower lobe. These are likely inflammatory.   Electronically Signed   By: Marin Roberts M.D.   On: 11/05/2014 16:52   Ct Abdomen Pelvis W Contrast  10/25/2014   CLINICAL DATA:  29 year old male with left lower quadrant pain, diarrhea and nausea for the past 24 hours. History of Crohn's disease.  EXAM: CT ABDOMEN AND PELVIS WITH CONTRAST  TECHNIQUE:  Multidetector CT imaging of the abdomen and pelvis was performed using the standard protocol following bolus administration of intravenous contrast.  CONTRAST:  OMNIPAQUE IOHEXOL 300 MG/ML  SOLN  COMPARISON:  CT the abdomen and pelvis 04/09/2009.  FINDINGS: Lower chest: Innumerable tiny peribronchovascular micronodules in the right lower lobe. The largest single nodule measures 5 mm in the medial right lower lobe (image 6 of series 3).  Hepatobiliary: No cystic or solid hepatic lesions. No intra or extrahepatic biliary ductal dilatation. Gallbladder is normal in appearance.  Pancreas: No pancreatic mass. No pancreatic ductal dilatation. No pancreatic or peripancreatic fluid or inflammatory changes.  Spleen: Spleen is mildly enlarged measuring 13.3 x 4.9 x 11.3 cm (estimated splenic volume of 368 mL).  Adrenals/Urinary Tract: Bilateral adrenal glands and bilateral kidneys are normal in appearance. No hydroureteronephrosis. Urinary bladder is normal in appearance.  Stomach/Bowel: The appearance of the stomach is normal. Status post subtotal colectomy and partial small bowel resection. There is some soft tissue thickening near the ileocolic anastomosis (colonic side). Several dilated loops of distal small bowel are noted, measuring up to 5.2 cm in diameter shortly before the anastomosis, however, no definite signs to suggest bowel obstruction are noted at this time. There is some hypervascularity and haziness in the ileocolic mesentery adjacent to the anastomosis, and there is a small amount of adjacent free fluid, suggesting some active inflammation. There several more proximal loops of small bowel which appear narrowed and thick walled, most notably in the proximal jejunum (image 37 of series 2). Other thickened loops of small bowel in the right side of the abdomen are favored to be related to complete decompression, as there are no surrounding inflammatory changes adjacent to these loops.  Vascular/Lymphatic:  No significant atherosclerotic disease, aneurysm or dissection identified in the abdominal or pelvic vasculature. No lymphadenopathy noted in the abdomen or pelvis.  Reproductive: Prostate gland and seminal vesicles are unremarkable in appearance.  Other: No significant volume of ascites.  No pneumoperitoneum.  Musculoskeletal: There are no aggressive appearing lytic or blastic lesions noted in the visualized portions of the skeleton.  IMPRESSION: 1. Postoperative changes of subtotal colectomy and partial small bowel resection, as above, with mild thickening near the ileocolonic anastomosis, and mild hypervascularity and inflammatory changes in the adjacent mesentery,  suggesting some activity in this patient with history of Crohn's disease. 2. Mild splenomegaly. 3. Multiple tiny peribronchovascular micronodules in the right lower lobe, new compared to remote prior study 04/10/2011. These are nonspecific, but favored to be benign, potentially sequela of prior episodes of aspiration. 4. Additional incidental findings, as above.   Electronically Signed   By: Trudie Reed M.D.   On: 10/25/2014 14:45   Mr Adora Fridge W/o W/cm  10/31/2014   CLINICAL DATA:  History of Crohn's disease  EXAM: MR ABDOMEN AND PELVIS WITHOUT AND WITH CONTRAST (MR ENTEROGRAPHY)  TECHNIQUE: Multiplanar, multisequence MRI of the abdomen and pelvis was performed both before and during bolus administration of intravenous contrast. Negative oral contrast VoLumen was given.  CONTRAST:  16mL MULTIHANCE GADOBENATE DIMEGLUMINE 529 MG/ML IV SOLN  COMPARISON:  10/25/2014  FINDINGS: MR ABDOMEN FINDINGS  Hepatobiliary: No focal liver abnormality identified. The gallbladder is normal. There is no biliary dilatation.  Pancreas: Normal appearance of the pancreas.  Spleen: Negative  Adrenals/Urinary Tract: The adrenal glands are normal. Normal appearance of the kidneys. No focal kidney abnormality identified.  Stomach/Bowel: The stomach appears within  normal limits. Mild increased caliber of the small bowel loops. The proximal jejunal loops measure up to 2.9 cm. The previously noted proximal small bowel loop with wall thickening is no longer visualized. The dilated distal small bowel loops near the enterocolonic anastomosis have improved currently measuring up to 3.4 cm versus 5.5 cm before. There is stable to improved inflammation in the ileocolic mesenterary adjacent to the enterocolonic anastomosis. Resolution of previous right lower quadrant free fluid. No fluid collections identified.  Vascular/Lymphatic: The abdominal aorta appears normal. No enlarged upper abdominal lymph nodes.  Other: No fluid collections or free fluid identified.  Musculoskeletal: No abnormal signal identified throughout the bone marrow.  MR PELVIS FINDINGS  Urinary Tract: The urinary bladder is unremarkable.  Bowel: The rectum appears normal. No wall thickening or inflammation involving the rectum identified.  Vascular/Lymphatic: Iliac vessels are within normal limits. No pelvic or inguinal adenopathy noted.  Reproductive: Prostate gland and seminal vesicles appear normal.  Other: No free fluid or fluid collections identified within the pelvis.  Musculoskeletal: No abnormal signal identified within the bone marrow.  IMPRESSION: 1. Persistent but improved small bowel dilatation. 2. Ileocolic mesenteric inflammation is stable to improved in the interval. Free fluid in the right lower quadrant has resolved in the interval. At this time there is no evidence for abscess formation or penetrating disease. 3. No new findings identified.   Electronically Signed   By: Signa Kell M.D.   On: 10/31/2014 19:58   Mr [name] Pelvis W/o W/cm  10/31/2014   CLINICAL DATA:  History of Crohn's disease  EXAM: MR ABDOMEN AND PELVIS WITHOUT AND WITH CONTRAST (MR ENTEROGRAPHY)  TECHNIQUE: Multiplanar, multisequence MRI of the abdomen and pelvis was performed both before and during bolus administration of  intravenous contrast. Negative oral contrast VoLumen was given.  CONTRAST:  16mL MULTIHANCE GADOBENATE DIMEGLUMINE 529 MG/ML IV SOLN  COMPARISON:  10/25/2014  FINDINGS: MR ABDOMEN FINDINGS  Hepatobiliary: No focal liver abnormality identified. The gallbladder is normal. There is no biliary dilatation.  Pancreas: Normal appearance of the pancreas.  Spleen: Negative  Adrenals/Urinary Tract: The adrenal glands are normal. Normal appearance of the kidneys. No focal kidney abnormality identified.  Stomach/Bowel: The stomach appears within normal limits. Mild increased caliber of the small bowel loops. The proximal jejunal loops measure up to 2.9 cm. The previously noted proximal small  bowel loop with wall thickening is no longer visualized. The dilated distal small bowel loops near the enterocolonic anastomosis have improved currently measuring up to 3.4 cm versus 5.5 cm before. There is stable to improved inflammation in the ileocolic mesenterary adjacent to the enterocolonic anastomosis. Resolution of previous right lower quadrant free fluid. No fluid collections identified.  Vascular/Lymphatic: The abdominal aorta appears normal. No enlarged upper abdominal lymph nodes.  Other: No fluid collections or free fluid identified.  Musculoskeletal: No abnormal signal identified throughout the bone marrow.  MR PELVIS FINDINGS  Urinary Tract: The urinary bladder is unremarkable.  Bowel: The rectum appears normal. No wall thickening or inflammation involving the rectum identified.  Vascular/Lymphatic: Iliac vessels are within normal limits. No pelvic or inguinal adenopathy noted.  Reproductive: Prostate gland and seminal vesicles appear normal.  Other: No free fluid or fluid collections identified within the pelvis.  Musculoskeletal: No abnormal signal identified within the bone marrow.  IMPRESSION: 1. Persistent but improved small bowel dilatation. 2. Ileocolic mesenteric inflammation is stable to improved in the interval.  Free fluid in the right lower quadrant has resolved in the interval. At this time there is no evidence for abscess formation or penetrating disease. 3. No new findings identified.   Electronically Signed   By: Signa Kell M.D.   On: 10/31/2014 19:58   Dg Abd Acute W/chest  11/05/2014   CLINICAL DATA:  29 year old male with left-sided abdominal pain nausea and vomiting history of Crohn's disease  EXAM: DG ABDOMEN ACUTE W/ 1V CHEST  COMPARISON:  MRI dated 10/31/2014 and CT dated 10/25/2014  FINDINGS: There is no bowel obstruction or dilatation. No radiopaque calculi identified. Multiple surgical clips noted in the left upper abdomen. Bowel anastomotic sutures noted in the pelvis. The osseous structures appear unremarkable.  IMPRESSION: No evidence of bowel obstruction.  No free air.   Electronically Signed   By: Elgie Collard M.D.   On: 11/05/2014 02:11    CBC  Recent Labs Lab 11/04/14 2032 11/05/14 1310  WBC 13.7* 18.1*  HGB 14.0 13.7  HCT 45.9 43.2  PLT 258 229  MCV 80.7 79.7  MCH 24.6* 25.3*  MCHC 30.5 31.7  RDW 16.4* 16.9*    Chemistries   Recent Labs Lab 10/31/14 0415 11/02/14 0615 11/04/14 2032 11/05/14 1310  NA 140 140 141 139  K 3.0* 3.2* 4.1 4.4  CL 104 112* 109 109  CO2 27 20* 24 13*  GLUCOSE 126* 146* 107* 203*  BUN <5* 9 13 11   CREATININE 0.80 0.86 0.94 1.02  CALCIUM 8.1* 8.4* 9.2 9.5  AST  --   --  42*  --   ALT  --   --  37  --   ALKPHOS  --   --  66  --   BILITOT  --   --  0.7  --    ------------------------------------------------------------------------------------------------------------------ estimated creatinine clearance is 92.3 mL/min (by C-G formula based on Cr of 1.02). ------------------------------------------------------------------------------------------------------------------ No results for input(s): HGBA1C in the last 72  hours. ------------------------------------------------------------------------------------------------------------------ No results for input(s): CHOL, HDL, LDLCALC, TRIG, CHOLHDL, LDLDIRECT in the last 72 hours. ------------------------------------------------------------------------------------------------------------------ No results for input(s): TSH, T4TOTAL, T3FREE, THYROIDAB in the last 72 hours.  Invalid input(s): FREET3 ------------------------------------------------------------------------------------------------------------------ No results for input(s): VITAMINB12, FOLATE, FERRITIN, TIBC, IRON, RETICCTPCT in the last 72 hours.  Coagulation profile No results for input(s): INR, PROTIME in the last 168 hours.  No results for input(s): DDIMER in the last 72 hours.  Cardiac Enzymes No results  for input(s): CKMB, TROPONINI, MYOGLOBIN in the last 168 hours.  Invalid input(s): CK ------------------------------------------------------------------------------------------------------------------ Invalid input(s): POCBNP  No results for input(s): GLUCAP in the last 72 hours.   RAI,RIPUDEEP M.D. [facility] 11/06/2014, 1:39 PM  Pager: [phone] Between 7am to 7pm - call Pager - [phone]  After 7pm go to [email or web address] - password [email or web address]  Call night coverage person covering after 7pm

## 2014-11-07 DIAGNOSIS — K5090 Crohn's disease, unspecified, without complications: Secondary | ICD-10-CM | POA: Diagnosis not present

## 2014-11-07 DIAGNOSIS — R1084 Generalized abdominal pain: Secondary | ICD-10-CM | POA: Diagnosis not present

## 2014-11-07 DIAGNOSIS — K50918 Crohn's disease, unspecified, with other complication: Secondary | ICD-10-CM | POA: Diagnosis not present

## 2014-11-07 DIAGNOSIS — R109 Unspecified abdominal pain: Secondary | ICD-10-CM | POA: Diagnosis not present

## 2014-11-07 DIAGNOSIS — E872 Acidosis: Secondary | ICD-10-CM | POA: Diagnosis not present

## 2014-11-07 DIAGNOSIS — R112 Nausea with vomiting, unspecified: Secondary | ICD-10-CM | POA: Diagnosis not present

## 2014-11-07 LAB — BASIC METABOLIC PANEL
Anion gap: 7 (ref 5–15)
BUN: 8 mg/dL (ref 6–20)
CALCIUM: 8.9 mg/dL (ref 8.9–10.3)
CHLORIDE: 110 mmol/L (ref 101–111)
CO2: 23 mmol/L (ref 22–32)
CREATININE: 0.61 mg/dL (ref 0.61–1.24)
GFR calc Af Amer: >60 mL/min (ref >60)
GFR calc non Af Amer: >60 mL/min (ref >60)
Glucose, Bld: 121 mg/dL — ABNORMAL HIGH (ref 65–99)
Potassium: 4.3 mmol/L (ref 3.5–5.1)
SODIUM: 140 mmol/L (ref 135–145)

## 2014-11-07 LAB — CBC
HCT: 38.5 % — ABNORMAL LOW (ref 39.0–52.0)
Hemoglobin: 11.7 g/dL — ABNORMAL LOW (ref 13.0–17.0)
MCH: 24.4 pg — AB (ref 26.0–34.0)
MCHC: 30.4 g/dL (ref 30.0–36.0)
MCV: 80.4 fL (ref 78.0–100.0)
PLATELETS: 186 10*3/uL (ref 150–400)
RBC: 4.79 MIL/uL (ref 4.22–5.81)
RDW: 17.7 % — AB (ref 11.5–15.5)
WBC: 7.1 10*3/uL (ref 4.0–10.5)

## 2014-11-07 MED ORDER — PREDNISONE 20 MG PO TABS
40.0000 mg | ORAL_TABLET | Freq: Every day | ORAL | Status: DC
  Administered 2014-11-08 – 2014-11-09 (×2): 40 mg via ORAL
  Filled 2014-11-07 (×2): qty 2

## 2014-11-07 MED ORDER — HYDROMORPHONE HCL 1 MG/ML IJ SOLN
1.0000 mg | INTRAMUSCULAR | Status: DC | PRN
  Administered 2014-11-07 – 2014-11-08 (×4): 1 mg via INTRAVENOUS
  Filled 2014-11-07 (×4): qty 1

## 2014-11-07 MED ORDER — AMITRIPTYLINE HCL 50 MG PO TABS
50.0000 mg | ORAL_TABLET | Freq: Every day | ORAL | Status: DC
  Administered 2014-11-07 – 2014-11-08 (×2): 50 mg via ORAL
  Filled 2014-11-07 (×2): qty 1

## 2014-11-07 NOTE — Care Management Important Message (Signed)
Important Message  Patient Details  Name: Andre [name] MRN: [id number] Date of Birth: 1985/07/10   Medicare Important Message Given:  Yes-second notification given    Lawerance Sabal, RN 11/07/2014, 10:40 AMImportant Message  Patient Details  Name: Andre [name] MRN: [id number] Date of Birth: 08-24-1985   Medicare Important Message Given:  Yes-second notification given    Lawerance Sabal, RN 11/07/2014, 10:40 AM

## 2014-11-07 NOTE — Progress Notes (Signed)
Daily Rounding Note  11/07/2014, 1:19 PM  LOS: 2 days   SUBJECTIVE:       4 watery, non-bloody stools so far this AM.  Still with diffuse abd pain.  No vomiting but +nausea.   OBJECTIVE:         Vital signs in last 24 hours:    Temp:  [97.8 F (36.6 C)-99 F (37.2 C)] 98.2 F (36.8 C) (09/16 0635) Pulse Rate:  [57-99] 61 (09/16 0635) Resp:  [13-18] 13 (09/16 0635) BP: (91-112)/(51-65) 91/51 mmHg (09/16 0635) SpO2:  [98 %-99 %] 99 % (09/16 0635) Last BM Date: 11/06/14 Filed Weights   11/05/14 1252  Weight: 150 lb (68.04 kg)   General: looks the same, chronically unwell.    Heart: RRR Chest: clear. No labored breathing Abdomen: soft, hypoactive BS, no tinkling/rushing/tympanitic BS.  Tenderness mild at best with no rebound or guarding.   Extremities: no CCe Neuro/Psych:  Oriented x 3.  Alert. Depressed.   Intake/Output from previous day: 09/15 0701 - 09/16 0700 In: 4028.3 [P.O.:650; I.V.:3378.3] Out: 1500 [Urine:1500]  Intake/Output this shift: Total I/O In: 420 [P.O.:420] Out: 800 [Urine:800]  Lab Results:  Recent Labs  11/04/14 2032 11/05/14 1310 11/07/14 0745  WBC 13.7* 18.1* 7.1  HGB 14.0 13.7 11.7*  HCT 45.9 43.2 38.5*  PLT 258 229 186   BMET  Recent Labs  11/04/14 2032 11/05/14 1310 11/07/14 0745  NA 141 139 140  K 4.1 4.4 4.3  CL 109 109 110  CO2 24 13* 23  GLUCOSE 107* 203* 121*  BUN 13 11 8   CREATININE 0.94 1.02 0.61  CALCIUM 9.2 9.5 8.9   LFT  Recent Labs  11/04/14 2032  PROT 7.2  ALBUMIN 4.6  AST 42*  ALT 37  ALKPHOS 66  BILITOT 0.7   PT/INR No results for input(s): LABPROT, INR in the last 72 hours. Hepatitis Panel No results for input(s): HEPBSAG, HCVAB, HEPAIGM, HEPBIGM in the last 72 hours.  Studies/Results: Ct Abdomen Pelvis W Contrast  11/05/2014   CLINICAL DATA:  Left lower quadrant abdominal pain the M1 week ago. Vomiting and diarrhea. Recent  hospitalization for similar symptoms. History of Crohn's disease. Personal history of subtotal colectomy.  EXAM: CT ABDOMEN AND PELVIS WITH CONTRAST  TECHNIQUE: Multidetector CT imaging of the abdomen and pelvis was performed using the standard protocol following bolus administration of intravenous contrast.  CONTRAST:  169m OMNIPAQUE IOHEXOL 300 MG/ML  SOLN  COMPARISON:  CT abdomen and pelvis 10/25/2014.  FINDINGS: Minimal atelectasis is present at the lung bases bilaterally. The previously noted medial right lower lobe nodule is no longer evident. A smaller peripheral nodule posteriorly measures 3 mm on image 7 of series 3. The rapid change suggests this was inflammatory as is the smaller nodule.  The heart size is normal. No significant pleural or pericardial effusion is present.  The liver and spleen are within normal limits. The stomach, duodenum, and pancreas are unremarkable. The common bile duct and gallbladder are normal. The adrenal glands are normal bilaterally. The kidneys and ureters are within normal limits bilaterally. Urinary bladder is unremarkable.  The rectosigmoid colon is within normal limits. The distal anastomosis is unremarkable. There is no evidence for leak. The small bowel is not particularly dilated. Adhesion is again suggested anteriorly near the umbilicus. The distal small bowel is mostly collapsed. Previously noted partial obstruction at the anastomosis is not evident on today's study.  There is no significant  free fluid or adenopathy.  Bone windows are unremarkable. Slight anterolisthesis at L5-S1 is stable.  IMPRESSION: 1. Subtotal colectomy with significant improvement of previously seen thickening and mild dilation at the ileocolonic anastomosis. 2. Slight caliber change in small bowel more proximally suggesting adhesions near the umbilicus. 3. No acute or focal lesion to explain acute pain otherwise. 4. Improved appearance of small nodules in the medial right lower lobe. These  are likely inflammatory.   Electronically Signed   By: San Morelle M.D.   On: 11/05/2014 16:52   Scheduled Meds: . amitriptyline  25 mg Oral QHS  . azaTHIOprine  175 mg Oral Daily  . gabapentin  900 mg Oral TID  . hyoscyamine  0.25 mg Sublingual Once  . methylPREDNISolone (SOLU-MEDROL) injection  40 mg Intravenous Q24H   Continuous Infusions: . dextrose 5 % and 0.45 % NaCl with KCl 10 mEq/L 100 mL/hr at 11/07/14 0525   PRN Meds:.acetaminophen **OR** acetaminophen, dicyclomine, HYDROcodone-acetaminophen, HYDROmorphone (DILAUDID) injection, ondansetron **OR** ondansetron (ZOFRAN) IV   ASSESMENT:   * abd pain, nausea, unable to tolerate po.  Hx refractory steroid dependent Crohn's. ?intermittent, symptomatic adhesive disease, ?concomittant IBS  * Elevated WBCs, may be due to the steroids.   * Steroid induced hyperglycemia/DM    PLAN   *  Maintain hyoscyamine (new), Solumedrol, Azathioprine, Elavil (New)  *  ? Advance from NPO to clears?     Azucena Freed  11/07/2014, 1:19 PM Pager: [phone]  Attending Addendum: I have taken an interval history, reviewed the chart, and examined the patient. I agree with the Advanced Practitioner's note and impression. I had a long talk with Mr. Caldera today. See my last note for review of his case. He has been steroid dependant for the past year with multiple admissions for chronic pain and diarrhea. While he has a history of Crohns and needs to be medicated for it, I don't think it is the primary driving etiology of his pain at this time. His imaging shows some mild inflammatory changes improved from previous. His endoscopy last week did not show any significant active disease. His ESR and CRP are normal. On abdominal exam today he has a (+) Carnett sign and I suspect he has a component of abdominal wall / neuropathic pain, perhaps related to his prior surgeries. Further, he is requesting higher doses of IV dilauded. 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 I discussed this with him and the need to come off narcotics long term, as it is not good for his Crohns. He became upset when we discussed this. I am not recommending to stop them immediately but he needs to be tapered off narcotics and replaced with another form of pain management.   At this time, recommend we increase Elavil to 520mq HS. Over time this can help neuropathic pain and diarrhea. Recommend pain management consultation for abdominal wall trigger point injection to see if this helps. I will back off on his IV steroids and convert to oral steroids given he hasn't responded to IV steroids and I don't think his Crohns is flaring to cause the amount of pain he is in at present time. Longer term, [facility] will be determine by where he chooses to seek  his longer term care. I wouldn't dose him now with it if he is going back to [place]. Otherwise, plan to transition from IV narcotics to oral narcotics and then off. Bowel rest has not provided any benefit (would expect some if active inflammation), will proceed with clears.    Nanwalek Cellar, MD [facility] Gastro[facility] Pager [phone]

## 2014-11-07 NOTE — Progress Notes (Signed)
[facility]                                                                              Patient Demographics  Andre [name], is a 29 y.o. male, DOB - 01/16/1986, YWV:[id number]  Admit date - 11/05/2014   Admitting Physician Clydia Llano, MD  Outpatient Primary MD for the patient is No primary care provider on file.  LOS - 2   Chief Complaint  Patient presents with  . Crohn's Disease  . Emesis  . Diarrhea       Brief HPI    Andre [name] is a 29 y.o. male with past medical history of Crohn's disease, had 4 previous abdominal surgery with SB resection came to the hospital complaining about nausea/vomiting and abdominal pain. Patient reported that he is from [place] and moved to [place], [place] to be close to a prominent GI doctor in [employer], he is visiting a friend. He has been in [place] for one month only, been in the hospital 4 times in 14 days. Patient reported this is his normal, he usually goes to the ED every 1-2 weeks and stays in the hospital about 3-4 days. He was discharged from the hospital on Sunday (3 days ago), reported nausea, vomiting and severe watery nonbloody diarrhea since discharge, he vomits whenever he eats so he came to the hospital for further evaluation. In the ED lactic acid is 2.7, WBCs 18.1 and his bicarbonate is 13 with anion gap of 17. Will be admitted to the hospital for further evaluation.   Assessment & Plan    Principal Problem:   Acute Crohn's disease flare/abdominal pain/diarrhea - Patient reports long-standing history of Crohn's disease and was diagnosed in 2009, has been [facility], has undergone bowel resection, is on immunosuppressants. - Continue IV Solu-Medrol, Imuran - stool studies including C. Difficile negative - Continue pain control, gentle hydration, bowel rest, NPO per GI - CT abdomen and pelvis showed subtotal colectomy with significant  improvement of the previously seen thickening, slight caliber change in the small bowel more proximally suggesting adhesions near the umbilicus, no acute or focal lesion.  Active Problems:   Refractory nausea and vomiting - Continue IV fluids    Metabolic acidosis, increased anion gap (IAG) likely due to lactic acidosis - Continue IV fluid hydration  Code Status: *Full code  Family Communication: Discussed in detail with the patient, all imaging results, lab results explained to the patient   Disposition Plan: Per GI  Time Spent in minutes  25 minutes  Procedures  CT abdomen  Consults   Gastroenterology  DVT Prophylaxis  SCDs  Medications  Scheduled Meds: . amitriptyline  25 mg Oral QHS  . azaTHIOprine  175 mg Oral Daily  . gabapentin  900 mg Oral TID  . hyoscyamine  0.25 mg Sublingual Once  . methylPREDNISolone (SOLU-MEDROL) injection  40 mg Intravenous Q24H   Continuous Infusions: . dextrose 5 % and 0.45 % NaCl with KCl 10 mEq/L 100 mL/hr at 11/07/14 0525   PRN Meds:.acetaminophen **OR** acetaminophen, dicyclomine, HYDROcodone-acetaminophen, HYDROmorphone (DILAUDID) injection, ondansetron **OR** ondansetron (ZOFRAN) IV   Antibiotics  Anti-infectives    None        Subjective:   Andre [name] was seen and examined today. Still complaining of abdominal pain 7/10, diarrhea. States pain medications are not helping. No fevers or chills.  Patient denies dizziness, chest pain, shortness of breath,numbess, tingling.   Objective:   Blood pressure 91/51, pulse 61, temperature 98.2 F (36.8 C), temperature source Oral, resp. rate 13, height  (1.575 m), weight 68.04 kg (150 lb), SpO2 99 %.  Wt Readings from Last 3 Encounters:  11/05/14 68.04 kg (150 lb)  11/04/14 68.04 kg (150 lb)  11/01/14 71.8 kg (158 lb 4.6 oz)     Intake/Output Summary (Last 24 hours) at 11/07/14 1132 Last data filed at 11/07/14 1610  Gross per 24 hour  Intake 4028.34 ml  Output    1500 ml  Net 2528.34 ml    Exam  General: Alert and oriented x 3, NAD  HEENT:  PERRLA, EOMI, Anicteric Sclera, mucous membranes moist.   Neck: Supple, no JVD, no masses  CVS: S1 S2 clear RRR  Respiratory: CTAB  Abdomen: Soft, mild diffuse tenderness, surgical scar  Ext: no cyanosis clubbing or edema  Neuro: no new deficits  Skin: No rashes  Psych: Normal affect and demeanor, alert and oriented x3    Data Review   Micro Results Recent Results (from the past 240 hour(s))  Ova and parasite examination     Status: None   Collection Time: 10/30/14  3:15 PM  Result Value Ref Range Status   Specimen Description PERIRECTAL  Final   Special Requests NONE  Final   Ova and parasites   Final    NO OVA OR PARASITES SEEN Performed at [facility]    Report Status 10/31/2014 FINAL  Final  C difficile quick scan w PCR reflex     Status: None   Collection Time: 11/06/14 11:19 AM  Result Value Ref Range Status   C Diff antigen NEGATIVE NEGATIVE Final   C Diff toxin NEGATIVE NEGATIVE Final   C Diff interpretation Negative for toxigenic C. difficile  Final    Radiology Reports Ct Abdomen Pelvis W Contrast  11/05/2014   CLINICAL DATA:  Left lower quadrant abdominal pain the M1 week ago. Vomiting and diarrhea. Recent hospitalization for similar symptoms. History of Crohn's disease. Personal history of subtotal colectomy.  EXAM: CT ABDOMEN AND PELVIS WITH CONTRAST  TECHNIQUE: Multidetector CT imaging of the abdomen and pelvis was performed using the standard protocol following bolus administration of intravenous contrast.  CONTRAST:  OMNIPAQUE IOHEXOL 300 MG/ML  SOLN  COMPARISON:  CT abdomen and pelvis 10/25/2014.  FINDINGS: Minimal atelectasis is present at the lung bases bilaterally. The previously noted medial right lower lobe nodule is no longer evident. A smaller peripheral nodule posteriorly measures 3 mm on image 7 of series 3. The rapid change suggests this was  inflammatory as is the smaller nodule.  The heart size is normal. No significant pleural or pericardial effusion is present.  The liver and spleen are within normal limits. The stomach, duodenum, and pancreas are unremarkable. The common bile duct and gallbladder are normal. The adrenal glands are normal bilaterally. The kidneys and ureters are within normal limits bilaterally. Urinary bladder is unremarkable.  The rectosigmoid colon is within normal limits. The distal anastomosis is unremarkable. There is no evidence for leak. The small bowel is not particularly dilated. Adhesion is again suggested anteriorly near the umbilicus. The distal small bowel is mostly collapsed.  Previously noted partial obstruction at the anastomosis is not evident on today's study.  There is no significant free fluid or adenopathy.  Bone windows are unremarkable. Slight anterolisthesis at L5-S1 is stable.  IMPRESSION: 1. Subtotal colectomy with significant improvement of previously seen thickening and mild dilation at the ileocolonic anastomosis. 2. Slight caliber change in small bowel more proximally suggesting adhesions near the umbilicus. 3. No acute or focal lesion to explain acute pain otherwise. 4. Improved appearance of small nodules in the medial right lower lobe. These are likely inflammatory.   Electronically Signed   By: Marin Roberts M.D.   On: 11/05/2014 16:52   Ct Abdomen Pelvis W Contrast  10/25/2014   CLINICAL DATA:  29 year old male with left lower quadrant pain, diarrhea and nausea for the past 24 hours. History of Crohn's disease.  EXAM: CT ABDOMEN AND PELVIS WITH CONTRAST  TECHNIQUE: Multidetector CT imaging of the abdomen and pelvis was performed using the standard protocol following bolus administration of intravenous contrast.  CONTRAST:  OMNIPAQUE IOHEXOL 300 MG/ML  SOLN  COMPARISON:  CT the abdomen and pelvis 04/09/2009.  FINDINGS: Lower chest: Innumerable tiny peribronchovascular micronodules in  the right lower lobe. The largest single nodule measures 5 mm in the medial right lower lobe (image 6 of series 3).  Hepatobiliary: No cystic or solid hepatic lesions. No intra or extrahepatic biliary ductal dilatation. Gallbladder is normal in appearance.  Pancreas: No pancreatic mass. No pancreatic ductal dilatation. No pancreatic or peripancreatic fluid or inflammatory changes.  Spleen: Spleen is mildly enlarged measuring 13.3 x 4.9 x 11.3 cm (estimated splenic volume of 368 mL).  Adrenals/Urinary Tract: Bilateral adrenal glands and bilateral kidneys are normal in appearance. No hydroureteronephrosis. Urinary bladder is normal in appearance.  Stomach/Bowel: The appearance of the stomach is normal. Status post subtotal colectomy and partial small bowel resection. There is some soft tissue thickening near the ileocolic anastomosis (colonic side). Several dilated loops of distal small bowel are noted, measuring up to 5.2 cm in diameter shortly before the anastomosis, however, no definite signs to suggest bowel obstruction are noted at this time. There is some hypervascularity and haziness in the ileocolic mesentery adjacent to the anastomosis, and there is a small amount of adjacent free fluid, suggesting some active inflammation. There several more proximal loops of small bowel which appear narrowed and thick walled, most notably in the proximal jejunum (image 37 of series 2). Other thickened loops of small bowel in the right side of the abdomen are favored to be related to complete decompression, as there are no surrounding inflammatory changes adjacent to these loops.  Vascular/Lymphatic: No significant atherosclerotic disease, aneurysm or dissection identified in the abdominal or pelvic vasculature. No lymphadenopathy noted in the abdomen or pelvis.  Reproductive: Prostate gland and seminal vesicles are unremarkable in appearance.  Other: No significant volume of ascites.  No pneumoperitoneum.  Musculoskeletal:  There are no aggressive appearing lytic or blastic lesions noted in the visualized portions of the skeleton.  IMPRESSION: 1. Postoperative changes of subtotal colectomy and partial small bowel resection, as above, with mild thickening near the ileocolonic anastomosis, and mild hypervascularity and inflammatory changes in the adjacent mesentery, suggesting some activity in this patient with history of Crohn's disease. 2. Mild splenomegaly. 3. Multiple tiny peribronchovascular micronodules in the right lower lobe, new compared to remote prior study 04/10/2011. These are nonspecific, but favored to be benign, potentially sequela of prior episodes of aspiration. 4. Additional incidental findings, as above.   Electronically  Signed   By: Trudie Reed M.D.   On: 10/25/2014 14:45   Mr Adora Fridge W/o W/cm  10/31/2014   CLINICAL DATA:  History of Crohn's disease  EXAM: MR ABDOMEN AND PELVIS WITHOUT AND WITH CONTRAST (MR ENTEROGRAPHY)  TECHNIQUE: Multiplanar, multisequence MRI of the abdomen and pelvis was performed both before and during bolus administration of intravenous contrast. Negative oral contrast VoLumen was given.  CONTRAST:  16mL MULTIHANCE GADOBENATE DIMEGLUMINE 529 MG/ML IV SOLN  COMPARISON:  10/25/2014  FINDINGS: MR ABDOMEN FINDINGS  Hepatobiliary: No focal liver abnormality identified. The gallbladder is normal. There is no biliary dilatation.  Pancreas: Normal appearance of the pancreas.  Spleen: Negative  Adrenals/Urinary Tract: The adrenal glands are normal. Normal appearance of the kidneys. No focal kidney abnormality identified.  Stomach/Bowel: The stomach appears within normal limits. Mild increased caliber of the small bowel loops. The proximal jejunal loops measure up to 2.9 cm. The previously noted proximal small bowel loop with wall thickening is no longer visualized. The dilated distal small bowel loops near the enterocolonic anastomosis have improved currently measuring up to 3.4 cm versus 5.5  cm before. There is stable to improved inflammation in the ileocolic mesenterary adjacent to the enterocolonic anastomosis. Resolution of previous right lower quadrant free fluid. No fluid collections identified.  Vascular/Lymphatic: The abdominal aorta appears normal. No enlarged upper abdominal lymph nodes.  Other: No fluid collections or free fluid identified.  Musculoskeletal: No abnormal signal identified throughout the bone marrow.  MR PELVIS FINDINGS  Urinary Tract: The urinary bladder is unremarkable.  Bowel: The rectum appears normal. No wall thickening or inflammation involving the rectum identified.  Vascular/Lymphatic: Iliac vessels are within normal limits. No pelvic or inguinal adenopathy noted.  Reproductive: Prostate gland and seminal vesicles appear normal.  Other: No free fluid or fluid collections identified within the pelvis.  Musculoskeletal: No abnormal signal identified within the bone marrow.  IMPRESSION: 1. Persistent but improved small bowel dilatation. 2. Ileocolic mesenteric inflammation is stable to improved in the interval. Free fluid in the right lower quadrant has resolved in the interval. At this time there is no evidence for abscess formation or penetrating disease. 3. No new findings identified.   Electronically Signed   By: Signa Kell M.D.   On: 10/31/2014 19:58   Mr [name] Pelvis W/o W/cm  10/31/2014   CLINICAL DATA:  History of Crohn's disease  EXAM: MR ABDOMEN AND PELVIS WITHOUT AND WITH CONTRAST (MR ENTEROGRAPHY)  TECHNIQUE: Multiplanar, multisequence MRI of the abdomen and pelvis was performed both before and during bolus administration of intravenous contrast. Negative oral contrast VoLumen was given.  CONTRAST:  16mL MULTIHANCE GADOBENATE DIMEGLUMINE 529 MG/ML IV SOLN  COMPARISON:  10/25/2014  FINDINGS: MR ABDOMEN FINDINGS  Hepatobiliary: No focal liver abnormality identified. The gallbladder is normal. There is no biliary dilatation.  Pancreas: Normal appearance of  the pancreas.  Spleen: Negative  Adrenals/Urinary Tract: The adrenal glands are normal. Normal appearance of the kidneys. No focal kidney abnormality identified.  Stomach/Bowel: The stomach appears within normal limits. Mild increased caliber of the small bowel loops. The proximal jejunal loops measure up to 2.9 cm. The previously noted proximal small bowel loop with wall thickening is no longer visualized. The dilated distal small bowel loops near the enterocolonic anastomosis have improved currently measuring up to 3.4 cm versus 5.5 cm before. There is stable to improved inflammation in the ileocolic mesenterary adjacent to the enterocolonic anastomosis. Resolution of previous right lower quadrant free fluid. No  fluid collections identified.  Vascular/Lymphatic: The abdominal aorta appears normal. No enlarged upper abdominal lymph nodes.  Other: No fluid collections or free fluid identified.  Musculoskeletal: No abnormal signal identified throughout the bone marrow.  MR PELVIS FINDINGS  Urinary Tract: The urinary bladder is unremarkable.  Bowel: The rectum appears normal. No wall thickening or inflammation involving the rectum identified.  Vascular/Lymphatic: Iliac vessels are within normal limits. No pelvic or inguinal adenopathy noted.  Reproductive: Prostate gland and seminal vesicles appear normal.  Other: No free fluid or fluid collections identified within the pelvis.  Musculoskeletal: No abnormal signal identified within the bone marrow.  IMPRESSION: 1. Persistent but improved small bowel dilatation. 2. Ileocolic mesenteric inflammation is stable to improved in the interval. Free fluid in the right lower quadrant has resolved in the interval. At this time there is no evidence for abscess formation or penetrating disease. 3. No new findings identified.   Electronically Signed   By: Signa Kell M.D.   On: 10/31/2014 19:58   Dg Abd Acute W/chest  11/05/2014   CLINICAL DATA:  29 year old male with  left-sided abdominal pain nausea and vomiting history of Crohn's disease  EXAM: DG ABDOMEN ACUTE W/ 1V CHEST  COMPARISON:  MRI dated 10/31/2014 and CT dated 10/25/2014  FINDINGS: There is no bowel obstruction or dilatation. No radiopaque calculi identified. Multiple surgical clips noted in the left upper abdomen. Bowel anastomotic sutures noted in the pelvis. The osseous structures appear unremarkable.  IMPRESSION: No evidence of bowel obstruction.  No free air.   Electronically Signed   By: Elgie Collard M.D.   On: 11/05/2014 02:11    CBC  Recent Labs Lab 11/04/14 2032 11/05/14 1310 11/07/14 0745  WBC 13.7* 18.1* 7.1  HGB 14.0 13.7 11.7*  HCT 45.9 43.2 38.5*  PLT 258 229 186  MCV 80.7 79.7 80.4  MCH 24.6* 25.3* 24.4*  MCHC 30.5 31.7 30.4  RDW 16.4* 16.9* 17.7*    Chemistries   Recent Labs Lab 11/02/14 0615 11/04/14 2032 11/05/14 1310 11/07/14 0745  NA 140 141 139 140  K 3.2* 4.1 4.4 4.3  CL 112* 109 109 110  CO2 20* 24 13* 23  GLUCOSE 146* 107* 203* 121*  BUN 9 13 11 8   CREATININE 0.86 0.94 1.02 0.61  CALCIUM 8.4* 9.2 9.5 8.9  AST  --  42*  --   --   ALT  --  37  --   --   ALKPHOS  --  66  --   --   BILITOT  --  0.7  --   --    ------------------------------------------------------------------------------------------------------------------ estimated creatinine clearance is 117.7 mL/min (by C-G formula based on Cr of 0.61). ------------------------------------------------------------------------------------------------------------------ No results for input(s): HGBA1C in the last 72 hours. ------------------------------------------------------------------------------------------------------------------ No results for input(s): CHOL, HDL, LDLCALC, TRIG, CHOLHDL, LDLDIRECT in the last 72 hours. ------------------------------------------------------------------------------------------------------------------ No results for input(s): TSH, T4TOTAL, T3FREE, THYROIDAB in  the last 72 hours.  Invalid input(s): FREET3 ------------------------------------------------------------------------------------------------------------------ No results for input(s): VITAMINB12, FOLATE, FERRITIN, TIBC, IRON, RETICCTPCT in the last 72 hours.  Coagulation profile No results for input(s): INR, PROTIME in the last 168 hours.  No results for input(s): DDIMER in the last 72 hours.  Cardiac Enzymes No results for input(s): CKMB, TROPONINI, MYOGLOBIN in the last 168 hours.  Invalid input(s): CK ------------------------------------------------------------------------------------------------------------------ Invalid input(s): POCBNP  No results for input(s): GLUCAP in the last 72 hours.   RAI,RIPUDEEP M.D. [facility] 11/07/2014, 11:32 AM  Pager: [phone] Between 7am to 7pm -  call Pager - [phone]  After 7pm go to [email or web address] - password [email or web address]  Call night coverage person covering after 7pm

## 2014-11-08 DIAGNOSIS — K5090 Crohn's disease, unspecified, without complications: Secondary | ICD-10-CM | POA: Diagnosis not present

## 2014-11-08 DIAGNOSIS — K50918 Crohn's disease, unspecified, with other complication: Secondary | ICD-10-CM | POA: Diagnosis not present

## 2014-11-08 DIAGNOSIS — E872 Acidosis: Secondary | ICD-10-CM | POA: Diagnosis not present

## 2014-11-08 DIAGNOSIS — R1032 Left lower quadrant pain: Secondary | ICD-10-CM | POA: Diagnosis not present

## 2014-11-08 DIAGNOSIS — R112 Nausea with vomiting, unspecified: Secondary | ICD-10-CM | POA: Diagnosis not present

## 2014-11-08 DIAGNOSIS — R109 Unspecified abdominal pain: Secondary | ICD-10-CM | POA: Diagnosis not present

## 2014-11-08 LAB — GI PATHOGEN PANEL BY PCR, STOOL
C DIFFICILE TOXIN A/B: NOT DETECTED
CRYPTOSPORIDIUM BY PCR: NOT DETECTED
Campylobacter by PCR: NOT DETECTED
E COLI (ETEC) LT/ST: NOT DETECTED
E COLI (STEC): NOT DETECTED
E COLI 0157 BY PCR: NOT DETECTED
G lamblia by PCR: NOT DETECTED
Norovirus GI/GII: NOT DETECTED
Rotavirus A by PCR: NOT DETECTED
SALMONELLA BY PCR: NOT DETECTED
Shigella by PCR: NOT DETECTED

## 2014-11-08 LAB — BASIC METABOLIC PANEL
ANION GAP: 9 (ref 5–15)
BUN: 8 mg/dL (ref 6–20)
CALCIUM: 8.4 mg/dL — AB (ref 8.9–10.3)
CO2: 24 mmol/L (ref 22–32)
Chloride: 104 mmol/L (ref 101–111)
Creatinine, Ser: 0.73 mg/dL (ref 0.61–1.24)
Glucose, Bld: 107 mg/dL — ABNORMAL HIGH (ref 65–99)
Potassium: 3.6 mmol/L (ref 3.5–5.1)
Sodium: 137 mmol/L (ref 135–145)

## 2014-11-08 LAB — CBC
HCT: 37.9 % — ABNORMAL LOW (ref 39.0–52.0)
HEMOGLOBIN: 11.9 g/dL — AB (ref 13.0–17.0)
MCH: 25.4 pg — ABNORMAL LOW (ref 26.0–34.0)
MCHC: 31.4 g/dL (ref 30.0–36.0)
MCV: 81 fL (ref 78.0–100.0)
Platelets: 179 10*3/uL (ref 150–400)
RBC: 4.68 MIL/uL (ref 4.22–5.81)
RDW: 17.5 % — ABNORMAL HIGH (ref 11.5–15.5)
WBC: 6 10*3/uL (ref 4.0–10.5)

## 2014-11-08 MED ORDER — HYDROMORPHONE HCL 1 MG/ML IJ SOLN
0.5000 mg | INTRAMUSCULAR | Status: DC | PRN
  Administered 2014-11-08 – 2014-11-09 (×4): 0.5 mg via INTRAVENOUS
  Filled 2014-11-08 (×5): qty 1

## 2014-11-08 MED ORDER — CHOLESTYRAMINE LIGHT 4 G PO PACK
4.0000 g | PACK | ORAL | Status: DC
  Administered 2014-11-08 – 2014-11-09 (×3): 4 g via ORAL
  Filled 2014-11-08 (×4): qty 1

## 2014-11-08 MED ORDER — LIDOCAINE 5 % EX PTCH
1.0000 | MEDICATED_PATCH | CUTANEOUS | Status: DC
  Administered 2014-11-08 – 2014-11-09 (×2): 1 via TRANSDERMAL
  Filled 2014-11-08 (×2): qty 1

## 2014-11-08 NOTE — Progress Notes (Signed)
[facility]                                                                              Patient Demographics  Andre [name], is a 29 y.o. male, DOB - 10/16/1985, TDD:[id number]  Admit date - 11/05/2014   Admitting Physician Clydia Llano, MD  Outpatient Primary MD for the patient is No primary care provider on file.  LOS - 3   Chief Complaint  Patient presents with  . Crohn's Disease  . Emesis  . Diarrhea       Brief HPI    Andre [name] is a 29 y.o. male with past medical history of Crohn's disease, had 4 previous abdominal surgery with SB resection came to the hospital complaining about nausea/vomiting and abdominal pain. Patient reported that he is from [place] and moved to [place], [place] to be close to a prominent GI doctor in [place], he is visiting a friend. He has been in [place] for one month only, been in the hospital 4 times in 14 days. Patient reported this is his normal, he usually goes to the ED every 1-2 weeks and stays in the hospital about 3-4 days. He was discharged from the hospital on Sunday (3 days ago), reported nausea, vomiting and severe watery nonbloody diarrhea since discharge, he vomits whenever he eats so he came to the hospital for further evaluation. In the ED lactic acid is 2.7, WBCs 18.1 and his bicarbonate is 13 with anion gap of 17. Will be admitted to the hospital for further evaluation.   Assessment & Plan    Principal Problem:   Acute Crohn's disease flare/abdominal pain/diarrhea - Patient reports long-standing history of Crohn's disease and was diagnosed in 2009, has been [facility], has undergone bowel resection, is on immunosuppressants. - transitioned to oral prednisone,  Imuran - stool studies; Cdiff negative  - CT abdomen and pelvis showed subtotal colectomy with significant improvement of the previously seen thickening, slight caliber change in the small  bowel more proximally suggesting adhesions near the umbilicus, no acute or focal lesion. 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 I am reducing IV dilaudid to 1/2 of current dosing and taper off IV dilaudid tomorrow.  - Clears today and advance as tolerated.    Active Problems:   Refractory nausea and vomiting - improving, Continue IV fluids    Metabolic acidosis, increased anion gap (IAG) likely due to lactic acidosis - Continue IV fluid hydration  Code Status: *Full code  Family Communication: Discussed in detail with the patient, all imaging results, lab results explained to the patient   Disposition Plan: Per GI  Time Spent in minutes  25 minutes  Procedures  CT abdomen  Consults   Gastroenterology  DVT Prophylaxis  SCDs  Medications  Scheduled Meds: . amitriptyline  50 mg Oral QHS  . azaTHIOprine  175 mg Oral Daily  . cholestyramine light  4 g Oral 2 times per day  . gabapentin  900 mg Oral TID  . hyoscyamine  0.25 mg Sublingual Once  . lidocaine  1 patch Transdermal Q24H  .  predniSONE  40 mg Oral Q breakfast   Continuous Infusions: . dextrose 5 % and 0.45 % NaCl with KCl 10 mEq/L 100 mL/hr at 11/08/14 0325   PRN Meds:.acetaminophen **OR** acetaminophen, dicyclomine, HYDROcodone-acetaminophen, HYDROmorphone (DILAUDID) injection, ondansetron **OR** ondansetron (ZOFRAN) IV   Antibiotics   Anti-infectives    None        Subjective:   Andre [name] was seen and examined today. Still feeling better today, no fevers or chills. Diarrhea+. C/o abd pain. Patient denies dizziness, chest pain, shortness of breath,numbess, tingling.   Objective:   Blood pressure 94/52, pulse 66, temperature 97.8 F (36.6 C), temperature source Oral, resp. rate 18, height 5\' 2"  (1.575 m), weight 68.04 kg (150 lb), SpO2 100 %.  Wt Readings from Last 3 Encounters:  11/05/14 68.04 kg (150 lb)  11/04/14 68.04 kg (150 lb)  11/01/14 71.8 kg  (158 lb 4.6 oz)     Intake/Output Summary (Last 24 hours) at 11/08/14 1338 Last data filed at 11/08/14 1126  Gross per 24 hour  Intake   1500 ml  Output      0 ml  Net   1500 ml    Exam  General: Alert and oriented x 3, NAD  HEENT:  PERRLA, EOMI  Neck: Supple, no JVD, no masses  CVS: S1 S2 clear RRR  Respiratory: Clear to ausculation b/l  Abdomen: Soft, mild diffuse tenderness, surgical scar  Ext: no cyanosis clubbing or edema  Neuro: no new deficits  Skin: No rashes  Psych: Normal affect and demeanor, alert and oriented x3    Data Review   Micro Results Recent Results (from the past 240 hour(s))  Ova and parasite examination     Status: None   Collection Time: 10/30/14  3:15 PM  Result Value Ref Range Status   Specimen Description PERIRECTAL  Final   Special Requests NONE  Final   Ova and parasites   Final    NO OVA OR PARASITES SEEN Performed at [facility]    Report Status 10/31/2014 FINAL  Final  C difficile quick scan w PCR reflex     Status: None   Collection Time: 11/06/14 11:19 AM  Result Value Ref Range Status   C Diff antigen NEGATIVE NEGATIVE Final   C Diff toxin NEGATIVE NEGATIVE Final   C Diff interpretation Negative for toxigenic C. difficile  Final    Radiology Reports Ct Abdomen Pelvis W Contrast  11/05/2014   CLINICAL DATA:  Left lower quadrant abdominal pain the M1 week ago. Vomiting and diarrhea. Recent hospitalization for similar symptoms. History of Crohn's disease. Personal history of subtotal colectomy.  EXAM: CT ABDOMEN AND PELVIS WITH CONTRAST  TECHNIQUE: Multidetector CT imaging of the abdomen and pelvis was performed using the standard protocol following bolus administration of intravenous contrast.  CONTRAST:  OMNIPAQUE IOHEXOL 300 MG/ML  SOLN  COMPARISON:  CT abdomen and pelvis 10/25/2014.  FINDINGS: Minimal atelectasis is present at the lung bases bilaterally. The previously noted medial right lower lobe nodule is  no longer evident. A smaller peripheral nodule posteriorly measures 3 mm on image 7 of series 3. The rapid change suggests this was inflammatory as is the smaller nodule.  The heart size is normal. No significant pleural or pericardial effusion is present.  The liver and spleen are within normal limits. The stomach, duodenum, and pancreas are unremarkable. The common bile duct and gallbladder are normal. The adrenal glands are normal bilaterally. The kidneys and ureters are within normal  limits bilaterally. Urinary bladder is unremarkable.  The rectosigmoid colon is within normal limits. The distal anastomosis is unremarkable. There is no evidence for leak. The small bowel is not particularly dilated. Adhesion is again suggested anteriorly near the umbilicus. The distal small bowel is mostly collapsed. Previously noted partial obstruction at the anastomosis is not evident on today's study.  There is no significant free fluid or adenopathy.  Bone windows are unremarkable. Slight anterolisthesis at L5-S1 is stable.  IMPRESSION: 1. Subtotal colectomy with significant improvement of previously seen thickening and mild dilation at the ileocolonic anastomosis. 2. Slight caliber change in small bowel more proximally suggesting adhesions near the umbilicus. 3. No acute or focal lesion to explain acute pain otherwise. 4. Improved appearance of small nodules in the medial right lower lobe. These are likely inflammatory.   Electronically Signed   By: Marin Roberts M.D.   On: 11/05/2014 16:52   Ct Abdomen Pelvis W Contrast  10/25/2014   CLINICAL DATA:  29 year old male with left lower quadrant pain, diarrhea and nausea for the past 24 hours. History of Crohn's disease.  EXAM: CT ABDOMEN AND PELVIS WITH CONTRAST  TECHNIQUE: Multidetector CT imaging of the abdomen and pelvis was performed using the standard protocol following bolus administration of intravenous contrast.  CONTRAST:  OMNIPAQUE IOHEXOL 300 MG/ML   SOLN  COMPARISON:  CT the abdomen and pelvis 04/09/2009.  FINDINGS: Lower chest: Innumerable tiny peribronchovascular micronodules in the right lower lobe. The largest single nodule measures 5 mm in the medial right lower lobe (image 6 of series 3).  Hepatobiliary: No cystic or solid hepatic lesions. No intra or extrahepatic biliary ductal dilatation. Gallbladder is normal in appearance.  Pancreas: No pancreatic mass. No pancreatic ductal dilatation. No pancreatic or peripancreatic fluid or inflammatory changes.  Spleen: Spleen is mildly enlarged measuring 13.3 x 4.9 x 11.3 cm (estimated splenic volume of 368 mL).  Adrenals/Urinary Tract: Bilateral adrenal glands and bilateral kidneys are normal in appearance. No hydroureteronephrosis. Urinary bladder is normal in appearance.  Stomach/Bowel: The appearance of the stomach is normal. Status post subtotal colectomy and partial small bowel resection. There is some soft tissue thickening near the ileocolic anastomosis (colonic side). Several dilated loops of distal small bowel are noted, measuring up to 5.2 cm in diameter shortly before the anastomosis, however, no definite signs to suggest bowel obstruction are noted at this time. There is some hypervascularity and haziness in the ileocolic mesentery adjacent to the anastomosis, and there is a small amount of adjacent free fluid, suggesting some active inflammation. There several more proximal loops of small bowel which appear narrowed and thick walled, most notably in the proximal jejunum (image 37 of series 2). Other thickened loops of small bowel in the right side of the abdomen are favored to be related to complete decompression, as there are no surrounding inflammatory changes adjacent to these loops.  Vascular/Lymphatic: No significant atherosclerotic disease, aneurysm or dissection identified in the abdominal or pelvic vasculature. No lymphadenopathy noted in the abdomen or pelvis.  Reproductive: Prostate gland  and seminal vesicles are unremarkable in appearance.  Other: No significant volume of ascites.  No pneumoperitoneum.  Musculoskeletal: There are no aggressive appearing lytic or blastic lesions noted in the visualized portions of the skeleton.  IMPRESSION: 1. Postoperative changes of subtotal colectomy and partial small bowel resection, as above, with mild thickening near the ileocolonic anastomosis, and mild hypervascularity and inflammatory changes in the adjacent mesentery, suggesting some activity in this patient with history  of Crohn's disease. 2. Mild splenomegaly. 3. Multiple tiny peribronchovascular micronodules in the right lower lobe, new compared to remote prior study 04/10/2011. These are nonspecific, but favored to be benign, potentially sequela of prior episodes of aspiration. 4. Additional incidental findings, as above.   Electronically Signed   By: Trudie Reed M.D.   On: 10/25/2014 14:45   Mr Adora Fridge W/o W/cm  10/31/2014   CLINICAL DATA:  History of Crohn's disease  EXAM: MR ABDOMEN AND PELVIS WITHOUT AND WITH CONTRAST (MR ENTEROGRAPHY)  TECHNIQUE: Multiplanar, multisequence MRI of the abdomen and pelvis was performed both before and during bolus administration of intravenous contrast. Negative oral contrast VoLumen was given.  CONTRAST:  16mL MULTIHANCE GADOBENATE DIMEGLUMINE 529 MG/ML IV SOLN  COMPARISON:  10/25/2014  FINDINGS: MR ABDOMEN FINDINGS  Hepatobiliary: No focal liver abnormality identified. The gallbladder is normal. There is no biliary dilatation.  Pancreas: Normal appearance of the pancreas.  Spleen: Negative  Adrenals/Urinary Tract: The adrenal glands are normal. Normal appearance of the kidneys. No focal kidney abnormality identified.  Stomach/Bowel: The stomach appears within normal limits. Mild increased caliber of the small bowel loops. The proximal jejunal loops measure up to 2.9 cm. The previously noted proximal small bowel loop with wall thickening is no longer  visualized. The dilated distal small bowel loops near the enterocolonic anastomosis have improved currently measuring up to 3.4 cm versus 5.5 cm before. There is stable to improved inflammation in the ileocolic mesenterary adjacent to the enterocolonic anastomosis. Resolution of previous right lower quadrant free fluid. No fluid collections identified.  Vascular/Lymphatic: The abdominal aorta appears normal. No enlarged upper abdominal lymph nodes.  Other: No fluid collections or free fluid identified.  Musculoskeletal: No abnormal signal identified throughout the bone marrow.  MR PELVIS FINDINGS  Urinary Tract: The urinary bladder is unremarkable.  Bowel: The rectum appears normal. No wall thickening or inflammation involving the rectum identified.  Vascular/Lymphatic: Iliac vessels are within normal limits. No pelvic or inguinal adenopathy noted.  Reproductive: Prostate gland and seminal vesicles appear normal.  Other: No free fluid or fluid collections identified within the pelvis.  Musculoskeletal: No abnormal signal identified within the bone marrow.  IMPRESSION: 1. Persistent but improved small bowel dilatation. 2. Ileocolic mesenteric inflammation is stable to improved in the interval. Free fluid in the right lower quadrant has resolved in the interval. At this time there is no evidence for abscess formation or penetrating disease. 3. No new findings identified.   Electronically Signed   By: Signa Kell M.D.   On: 10/31/2014 19:58   Mr [name] Pelvis W/o W/cm  10/31/2014   CLINICAL DATA:  History of Crohn's disease  EXAM: MR ABDOMEN AND PELVIS WITHOUT AND WITH CONTRAST (MR ENTEROGRAPHY)  TECHNIQUE: Multiplanar, multisequence MRI of the abdomen and pelvis was performed both before and during bolus administration of intravenous contrast. Negative oral contrast VoLumen was given.  CONTRAST:  16mL MULTIHANCE GADOBENATE DIMEGLUMINE 529 MG/ML IV SOLN  COMPARISON:  10/25/2014  FINDINGS: MR ABDOMEN FINDINGS   Hepatobiliary: No focal liver abnormality identified. The gallbladder is normal. There is no biliary dilatation.  Pancreas: Normal appearance of the pancreas.  Spleen: Negative  Adrenals/Urinary Tract: The adrenal glands are normal. Normal appearance of the kidneys. No focal kidney abnormality identified.  Stomach/Bowel: The stomach appears within normal limits. Mild increased caliber of the small bowel loops. The proximal jejunal loops measure up to 2.9 cm. The previously noted proximal small bowel loop with wall thickening is no longer  visualized. The dilated distal small bowel loops near the enterocolonic anastomosis have improved currently measuring up to 3.4 cm versus 5.5 cm before. There is stable to improved inflammation in the ileocolic mesenterary adjacent to the enterocolonic anastomosis. Resolution of previous right lower quadrant free fluid. No fluid collections identified.  Vascular/Lymphatic: The abdominal aorta appears normal. No enlarged upper abdominal lymph nodes.  Other: No fluid collections or free fluid identified.  Musculoskeletal: No abnormal signal identified throughout the bone marrow.  MR PELVIS FINDINGS  Urinary Tract: The urinary bladder is unremarkable.  Bowel: The rectum appears normal. No wall thickening or inflammation involving the rectum identified.  Vascular/Lymphatic: Iliac vessels are within normal limits. No pelvic or inguinal adenopathy noted.  Reproductive: Prostate gland and seminal vesicles appear normal.  Other: No free fluid or fluid collections identified within the pelvis.  Musculoskeletal: No abnormal signal identified within the bone marrow.  IMPRESSION: 1. Persistent but improved small bowel dilatation. 2. Ileocolic mesenteric inflammation is stable to improved in the interval. Free fluid in the right lower quadrant has resolved in the interval. At this time there is no evidence for abscess formation or penetrating disease. 3. No new findings identified.    Electronically Signed   By: Signa Kell M.D.   On: 10/31/2014 19:58   Dg Abd Acute W/chest  11/05/2014   CLINICAL DATA:  29 year old male with left-sided abdominal pain nausea and vomiting history of Crohn's disease  EXAM: DG ABDOMEN ACUTE W/ 1V CHEST  COMPARISON:  MRI dated 10/31/2014 and CT dated 10/25/2014  FINDINGS: There is no bowel obstruction or dilatation. No radiopaque calculi identified. Multiple surgical clips noted in the left upper abdomen. Bowel anastomotic sutures noted in the pelvis. The osseous structures appear unremarkable.  IMPRESSION: No evidence of bowel obstruction.  No free air.   Electronically Signed   By: Elgie Collard M.D.   On: 11/05/2014 02:11    CBC  Recent Labs Lab 11/04/14 2032 11/05/14 1310 11/07/14 0745 11/08/14 0912  WBC 13.7* 18.1* 7.1 6.0  HGB 14.0 13.7 11.7* 11.9*  HCT 45.9 43.2 38.5* 37.9*  PLT 258 229 186 179  MCV 80.7 79.7 80.4 81.0  MCH 24.6* 25.3* 24.4* 25.4*  MCHC 30.5 31.7 30.4 31.4  RDW 16.4* 16.9* 17.7* 17.5*    Chemistries   Recent Labs Lab 11/02/14 0615 11/04/14 2032 11/05/14 1310 11/07/14 0745 11/08/14 0912  NA 140 141 139 140 137  K 3.2* 4.1 4.4 4.3 3.6  CL 112* 109 109 110 104  CO2 20* 24 13* 23 24  GLUCOSE 146* 107* 203* 121* 107*  BUN CREATININE 0.86 0.94 1.02 0.61 0.73  CALCIUM 8.4* 9.2 9.5 8.9 8.4*  AST  --  42*  --   --   --   ALT  --  37  --   --   --   ALKPHOS  --  66  --   --   --   BILITOT  --  0.7  --   --   --    ------------------------------------------------------------------------------------------------------------------ estimated creatinine clearance is 117.7 mL/min (by C-G formula based on Cr of 0.73). ------------------------------------------------------------------------------------------------------------------ No results for input(s): HGBA1C in the last 72  hours. ------------------------------------------------------------------------------------------------------------------ No results for input(s): CHOL, HDL, LDLCALC, TRIG, CHOLHDL, LDLDIRECT in the last 72 hours. ------------------------------------------------------------------------------------------------------------------ No results for input(s): TSH, T4TOTAL, T3FREE, THYROIDAB in the last 72 hours.  Invalid input(s): FREET3 ------------------------------------------------------------------------------------------------------------------ No results for input(s): VITAMINB12, FOLATE, FERRITIN, TIBC, IRON, RETICCTPCT in  the last 72 hours.  Coagulation profile No results for input(s): INR, PROTIME in the last 168 hours.  No results for input(s): DDIMER in the last 72 hours.  Cardiac Enzymes No results for input(s): CKMB, TROPONINI, MYOGLOBIN in the last 168 hours.  Invalid input(s): CK ------------------------------------------------------------------------------------------------------------------ Invalid input(s): POCBNP  No results for input(s): GLUCAP in the last 72 hours.   RAI,RIPUDEEP M.D. [facility] 11/08/2014, 1:38 PM  Pager: [phone] Between 7am to 7pm - call Pager - [phone]  After 7pm go to [email or web address] - password [email or web address]  Call night coverage person covering after 7pm

## 2014-11-08 NOTE — Progress Notes (Signed)
Progress Note   Subjective  Patient reports less diarrhea today after increasing Elavil, however pain is unchanged. Refusing oral narcotics, only wants IV narcotics. Has been NPO without change in his pain.   Objective   Vital signs in last 24 hours: Temp:  [97.8 F (36.6 C)-98.5 F (36.9 C)] 97.8 F (36.6 C) (09/17 0536) Pulse Rate:  [55-83] 66 (09/17 0536) Resp:  [17-18] 18 (09/16 2111) BP: (88-100)/(47-56) 94/52 mmHg (09/17 0536) SpO2:  [98 %-100 %] 100 % (09/17 0536) Last BM Date: 11/07/14 General:    White male in NAD Heart:  Regular rate and rhythm; no murmurs Lungs: Respirations even and unlabored, lungs CTA bilaterally Abdomen:  Soft, large midline scar, LLQ pain with (+) Carnett sign, no peritoneal signs. Normal bowel sounds. Extremities:  Without edema. Neurologic:  Alert and oriented,  grossly normal neurologically. Psych:  Cooperative. Normal mood and affect.  Intake/Output from previous day: 09/16 0701 - 09/17 0700 In: 1620 [P.O.:420; I.V.:1200] Out: 800 [Urine:800] Intake/Output this shift:    Lab Results:  Recent Labs  11/05/14 1310 11/07/14 0745  WBC 18.1* 7.1  HGB 13.7 11.7*  HCT 43.2 38.5*  PLT 229 186   BMET  Recent Labs  11/05/14 1310 11/07/14 0745  NA 139 140  K 4.4 4.3  CL 109 110  CO2 13* 23  GLUCOSE 203* 121*  BUN 11 8  CREATININE 1.02 0.61  CALCIUM 9.5 8.9   LFT No results for input(s): PROT, ALBUMIN, AST, ALT, ALKPHOS, BILITOT, BILIDIR, IBILI in the last 72 hours. PT/INR No results for input(s): LABPROT, INR in the last 72 hours.  Studies/Results: No results found.     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 While he has a history of Crohns and needs to be medicated for it, I don't think it is the primary driving  etiology of his pain at this time. His imaging shows some mild inflammatory changes improved from previous. His endoscopy last week did not show any significant active disease. His ESR and CRP are normal. CMV testing negative. On abdominal exam he has a (+) Carnett sign and I suspect he has a component of abdominal wall / neuropathic pain, perhaps related to his prior surgeries. Further, he is requesting higher doses of IV dilauded. 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 I discussed this with him yesterday at length and the need to come off narcotics long term, as it is not good for his Crohns. I am not recommending to stop them all immediately but he needs to be tapered off IV narcotics, then transition to oral, and replaced with another form of pain management.   At this time, I think his pain is multifactorial from abdominal wall / neuropathic pain from prior abdominal surgeries, narcotic bowel, and Crohns. I have increased his Elavil to 590mq HS which has helped his diarrhea. We will try him on cholestyramine today to see if this helps. I think we can put him on clear liquid diet and advance as tolerated. I asked for a pain management consultation for abdominal wall trigger point injection to see  if this helps, however after discussion with anesthesia this is not possible as an inpatient. He will definitively warrant this as an outpatient. I will back off on his IV steroids and convert to oral steroids given he hasn't responded to IV steroids and I don't think his Crohns is flaring to cause the amount of pain he is in at present time. Longer term, he does need to start [facility] and the timing of this will be determine by where he chooses to seek his longer term care. I wouldn't dose him now with it if he is going back to  [place]. Otherwise, plan to transition from IV narcotics to oral narcotics and then off, please minimize IV narcotics. Will try a lidocaine patch today at the affected site to see if this helps as well. Otherwise await lab work for today   Please call with questions / concerns.   Andre Cellar, MD [name] Pager [phone]  Principal Problem:   Acute Crohn's disease Active Problems:   Refractory nausea and vomiting   Abdominal pain   Metabolic acidosis, increased anion gap (IAG)     LOS: 3 days   Andre [name]  11/08/2014, 8:24 AM

## 2014-11-09 DIAGNOSIS — K50918 Crohn's disease, unspecified, with other complication: Secondary | ICD-10-CM | POA: Diagnosis not present

## 2014-11-09 DIAGNOSIS — R1032 Left lower quadrant pain: Secondary | ICD-10-CM | POA: Diagnosis not present

## 2014-11-09 DIAGNOSIS — K5090 Crohn's disease, unspecified, without complications: Secondary | ICD-10-CM | POA: Diagnosis not present

## 2014-11-09 DIAGNOSIS — E872 Acidosis: Secondary | ICD-10-CM | POA: Diagnosis not present

## 2014-11-09 DIAGNOSIS — R112 Nausea with vomiting, unspecified: Secondary | ICD-10-CM | POA: Diagnosis not present

## 2014-11-09 DIAGNOSIS — R109 Unspecified abdominal pain: Secondary | ICD-10-CM | POA: Diagnosis not present

## 2014-11-09 LAB — CBC
HEMATOCRIT: 39.1 % (ref 39.0–52.0)
Hemoglobin: 11.9 g/dL — ABNORMAL LOW (ref 13.0–17.0)
MCH: 24.6 pg — ABNORMAL LOW (ref 26.0–34.0)
MCHC: 30.4 g/dL (ref 30.0–36.0)
MCV: 80.8 fL (ref 78.0–100.0)
PLATELETS: 217 10*3/uL (ref 150–400)
RBC: 4.84 MIL/uL (ref 4.22–5.81)
RDW: 17.1 % — AB (ref 11.5–15.5)
WBC: 6.4 10*3/uL (ref 4.0–10.5)

## 2014-11-09 LAB — BASIC METABOLIC PANEL
Anion gap: 8 (ref 5–15)
CO2: 24 mmol/L (ref 22–32)
CREATININE: 0.75 mg/dL (ref 0.61–1.24)
Calcium: 8.7 mg/dL — ABNORMAL LOW (ref 8.9–10.3)
Chloride: 108 mmol/L (ref 101–111)
GFR calc Af Amer: >60 mL/min (ref >60)
Glucose, Bld: 100 mg/dL — ABNORMAL HIGH (ref 65–99)
POTASSIUM: 3.5 mmol/L (ref 3.5–5.1)
SODIUM: 140 mmol/L (ref 135–145)

## 2014-11-09 MED ORDER — HYDROMORPHONE HCL 1 MG/ML IJ SOLN: 0.5000 mg | Freq: Four times a day (QID) | INTRAMUSCULAR | Status: DC | PRN

## 2014-11-09 MED ORDER — PREDNISONE 10 MG PO TABS: ORAL_TABLET | ORAL | Status: DC

## 2014-11-09 MED ORDER — DICYCLOMINE HCL 20 MG PO TABS: 20.0000 mg | ORAL_TABLET | Freq: Three times a day (TID) | ORAL | Status: DC | PRN

## 2014-11-09 MED ORDER — HYDROCODONE-ACETAMINOPHEN 5-325 MG PO TABS: 1.0000 | ORAL_TABLET | ORAL | Status: DC | PRN

## 2014-11-09 MED ORDER — CHOLESTYRAMINE LIGHT 4 G PO PACK: 4.0000 g | PACK | Freq: Two times a day (BID) | ORAL | Status: DC

## 2014-11-09 MED ORDER — AMITRIPTYLINE HCL 50 MG PO TABS: 50.0000 mg | ORAL_TABLET | Freq: Every day | ORAL | Status: DC

## 2014-11-09 MED ORDER — LOPERAMIDE HCL 2 MG PO CAPS: 4.0000 mg | ORAL_CAPSULE | ORAL | Status: DC | PRN

## 2014-11-09 MED ORDER — LIDOCAINE 5 % EX PTCH: 1.0000 | MEDICATED_PATCH | CUTANEOUS | Status: DC

## 2014-11-09 NOTE — Progress Notes (Signed)
Progress Note   Subjective  Patient reports his diarrhea has been improved with adding choletyramine and elavil. Lidocaine patch is also helping. Feeling better today. Thinks he is ready for discharge.    Objective   Vital signs in last 24 hours: Temp:  [97.8 F (36.6 C)-98.7 F (37.1 C)] 97.8 F (36.6 C) (09/18 0523) Pulse Rate:  [62-94] 64 (09/18 0523) Resp:  [16-20] 16 (09/18 0523) BP: (105-110)/(62-73) 105/68 mmHg (09/18 0523) SpO2:  [98 %-100 %] 100 % (09/18 0523) Last BM Date: 11/08/14 General:    white male in NAD Heart:  Regular rate and rhythm; no murmurs Lungs: Respirations even and unlabored, lungs CTA bilaterally Abdomen:  Soft, LLQ TTP with positive Carnett sign, no rebound or guarding. Normal bowel sounds. Extremities:  Without edema. Neurologic:  Alert and oriented,  grossly normal neurologically. Psych:  Cooperative. Normal mood and affect.  Intake/Output from previous day: 09/17 0701 - 09/18 0700 In: 1000 [P.O.:1000] Out: 300 [Urine:300] Intake/Output this shift: Total I/O In: 200 [P.O.:200] Out: -   Lab Results:  Recent Labs  11/07/14 0745 11/08/14 0912 11/09/14 0742  WBC 7.1 6.0 6.4  HGB 11.7* 11.9* 11.9*  HCT 38.5* 37.9* 39.1  PLT 186 179 217   BMET  Recent Labs  11/07/14 0745 11/08/14 0912 11/09/14 0742  NA 140 137 140  K 4.3 3.6 3.5  CL 110 104 108  CO2 _0 GLUCOSE 121* 107* 100*  BUN 8 8 <5*  CREATININE 0.61 0.73 0.75  CALCIUM 8.9 8.4* 8.7*   LFT No results for input(s): PROT, ALBUMIN, AST, ALT, ALKPHOS, BILITOT, BILIDIR, IBILI in the last 72 hours. PT/INR No results for input(s): LABPROT, INR in the last 72 hours.  Studies/Results: No results found.     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 While he has  a history of Crohns and needs to be medicated for it, I don't think it is the primary driving etiology of his pain at this time. His imaging shows some mild inflammatory changes improved from previous. His endoscopy last week did not show any significant active disease. His ESR and CRP are normal. CMV testing negative. On abdominal exam he has a (+) Carnett sign and I suspect he has a component of abdominal wall / neuropathic pain, perhaps related to his prior surgeries. 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 I discussed this with him yesterday at length and the need to come off narcotics long term, as it is not good for his Crohns.  At this time, I think his pain is multifactorial from abdominal wall / neuropathic pain from prior abdominal surgeries, narcotic bowel, and Crohns. I have increased his Elavil to 85m q HS and have started cholestyramine which has helped his diarrhea. I asked for a pain management consultation for abdominal wall trigger point injection to see if this helps, however after discussion with anesthesia this is not possible as an inpatient. He will definitively warrant this as an outpatient. We gave him a trial of lidocaine patches which as helped as well.    Regarding his Crohns, now on oral prednisone 430m Recommend 4036mor one week then taper by 5mg86mery week.  Longer term, he does need to start Entyvio and the  timing of this will be determine by where he chooses to seek his longer term care. I wouldn't dose him now with it if he is going back to [place]. Otherwise, plan to transition to oral narcotics and then off. Stable for discharge today from my perspective on this present regimen.  Please call with questions / concerns.   [place] Cellar, MD [facility] Pager [phone]  Principal Problem:   Acute Crohn's disease Active Problems:   Refractory nausea and vomiting   Abdominal pain   Metabolic acidosis,  increased anion gap (IAG)     LOS: 4 days   Renelda Loma Armbruster  11/09/2014, 12:17 PM

## 2014-11-09 NOTE — Progress Notes (Addendum)
Patient discharged to home, IV dc'd. Vitals stable for pt. discharge instructions and education reviewed and all question addressed. 11/09/2014 4:48 PM Bowie,Sharlene

## 2014-11-09 NOTE — Discharge Summary (Signed)
Physician Discharge Summary   Patient ID: Andre [name] MRN: [id number] DOB/AGE: [birth year] 29 y.o.  Admit date: 11/05/2014 Discharge date: 11/09/2014  Primary Care Physician:  No primary care [name] on file.  Discharge Diagnoses:    . Abdominal wall pain/neuropathic pain, narcotic bowel  . Acute Crohn's disease . Refractory nausea and vomiting . Metabolic acidosis, increased anion gap (IAG)  Consults:  Gastroenterology,Dr Armbuster   Recommendations for Outpatient Follow-up:  Patient was recommended prednisone with taper 5 mg weekly as per instructions  Patient needs to follow-up with pain clinic  TESTS THAT NEED FOLLOW-UP None   DIET: Soft diet    Allergies:   Allergies  Allergen Reactions  . Remicade [Infliximab] Anaphylaxis  . Compazine [Prochlorperazine]     agitation  . Ketorolac     Other reaction(s): GI Distress  . Lithium     Toxicity   . Morphine     Other reaction(s): GI Distress  . Nsaids     Other reaction(s): Other (See Comments)  . Ciprofloxacin Rash  . Metronidazole Rash     Discharge Medications:   Medication List    STOP taking these medications        metroNIDAZOLE 500 MG tablet  Commonly known as:  FLAGYL     oxyCODONE-acetaminophen 5-325 MG per tablet  Commonly known as:  PERCOCET/ROXICET      TAKE these medications        ALPRAZolam 0.5 MG tablet  Commonly known as:  XANAX  Take 0.5 mg by mouth 2 (two) times daily.     amitriptyline 50 MG tablet  Commonly known as:  ELAVIL  Take 1 tablet (50 mg total) by mouth at bedtime.     azaTHIOprine 50 MG tablet  Commonly known as:  IMURAN  Take 175 mg by mouth daily.     cholestyramine light 4 G packet  Commonly known as:  PREVALITE  Take 1 packet (4 g total) by mouth 2 (two) times daily.     dicyclomine 20 MG tablet  Commonly known as:  BENTYL  Take 1 tablet (20 mg total) by mouth 3 (three) times daily as needed for spasms.     gabapentin 300 MG capsule  Commonly  known as:  NEURONTIN  Take 900 mg by mouth 3 (three) times daily.     HYDROcodone-acetaminophen 5-325 MG per tablet  Commonly known as:  NORCO/VICODIN  Take 1-2 tablets by mouth every 4 (four) hours as needed for moderate pain.     hydrocortisone 25 MG suppository  Commonly known as:  ANUSOL-HC  Place 1 suppository (25 mg total) rectally 2 (two) times daily as needed (rectal pain).     Iron 325 (65 FE) MG Tabs  Take 325 mg by mouth daily.     lidocaine 5 %  Commonly known as:  LIDODERM  Place 1 patch onto the skin daily. Remove & Discard patch within 12 hours or as directed by MD     loperamide 2 MG capsule  Commonly known as:  IMODIUM  Take 2 capsules (4 mg total) by mouth as needed for diarrhea or loose stools (over the counter. Take if diarrhea uncontrolled.).     Magnesium 300 MG Caps  Take 300 mg by mouth daily.     ondansetron 4 MG disintegrating tablet  Commonly known as:  ZOFRAN ODT  Take 1 tablet (4 mg total) by mouth every 8 (eight) hours as needed for nausea or vomiting.     predniSONE 10 MG tablet  Commonly known as:  DELTASONE  Take prednisone 40 mg (4 tabs) for a week, then taper by 5 mg weekly until off.         Brief H and P: For complete details please refer to admission H and P, but in brief Andre [name] is a 29 y.o. male with past medical history of Crohn's disease, had 4 previous abdominal surgery with SB resection came to the hospital complaining about nausea/vomiting and abdominal pain. Patient reported that he is from [place] and moved to [place], [place] to be close to a prominent GI doctor in [employer], he is visiting a friend. He has been in [place] for one month only, been in the hospital 4 times in 14 days. Patient reported this is his normal, he usually goes to the ED every 1-2 weeks and stays in the hospital about 3-4 days. He was discharged from the hospital on Sunday (3 days ago), reported nausea, vomiting and severe watery  nonbloody diarrhea since discharge, he vomits whenever he eats so he came to the hospital for further evaluation. In the ED lactic acid is 2.7, WBCs 18.1 and his bicarbonate is 13 with anion gap of 17. Will be admitted to the hospital for further evaluation.  Hospital Course:   Acute Crohn's disease flare/abdominal pain/diarrhea - Patient reports long-standing history of Crohn's disease and was diagnosed in 2009, has been following [facility], has undergone bowel resection, is on immunosuppressants. Patient is visiting [facility] for the last month and has been in the hospital 4 times in the 14 days prior to admission. Although patient reported that this is his normal, he usually goes to the ED every 1-2 weeks and is in the hospital about 3-4 days. Gastroenterology was consulted. Patient was initially placed on IV Solu-Medrol subsequently transitioned to oral prednisone. He was continued on imuran. Stool studies showed CT abdomen and pelvis showed subtotal colectomy with significant improvement of the previously seen thickening, slight caliber change in the small bowel more proximally suggesting adhesions near the umbilicus, no acute or focal lesion. Patient also appeared to have narcotic seeking behavior with dependency. Per gastroenterology,Dr Armbruster, patient's abdominal pain is multifactorial likely from abdominal wall/neuropathic pain from prior abdominal surgeries, narcotic bowel and Crohn's. Patient was placed on Elavil, lidocaine patch. IV narcotic pain medications were minimized. Longer term he needs to start Entyvio but to be determined by his primary gastroenterologist at [facility] [place]. He also was recommended to follow up with pain medication clinic Diet was advanced.   Refractory nausea and vomiting - improving, patient was placed on IV fluids. He is not tolerating solid diet.   Metabolic acidosis, increased anion gap (IAG)  likely due to lactic acidosis - Due to nausea, vomiting and diarrhea. Currently improved.    Day of Discharge BP 105/68 mmHg  Pulse 64  Temp(Src) 97.8 F (36.6 C) (Oral)  Resp 16  Ht  (1.575 m)  Wt 68.04 kg (150 lb)  BMI 27.43 kg/m2  SpO2 100%  Physical Exam: General: Alert and awake oriented x3 not in any acute distress. HEENT: anicteric sclera, pupils reactive to light and accommodation CVS: S1-S2 clear no murmur rubs or gallops Chest: clear to auscultation bilaterally, no wheezing rales or rhonchi Abdomen: soft nontender, nondistended, normal bowel sounds Extremities: no cyanosis, clubbing or edema noted bilaterally Neuro: Cranial nerves II-XII intact, no focal neurological deficits   The results of significant diagnostics from this hospitalization (including imaging, microbiology, ancillary and laboratory) are  listed below for reference.    LAB RESULTS: Basic Metabolic Panel:  Recent Labs Lab 11/08/14 0912 11/09/14 0742  NA 137 140  K 3.6 3.5  CL 104 108  CO2 24 24  GLUCOSE 107* 100*  BUN 8 <5*  CREATININE 0.73 0.75  CALCIUM 8.4* 8.7*   Liver Function Tests:  Recent Labs Lab 11/04/14 2032  AST 42*  ALT 37  ALKPHOS 66  BILITOT 0.7  PROT 7.2  ALBUMIN 4.6    Recent Labs Lab 11/04/14 2032 11/05/14 1310  LIPASE 18* 22   No results for input(s): AMMONIA in the last 168 hours. CBC:  Recent Labs Lab 11/08/14 0912 11/09/14 0742  WBC 6.0 6.4  HGB 11.9* 11.9*  HCT 37.9* 39.1  MCV 81.0 80.8  PLT 179 217   Cardiac Enzymes: No results for input(s): CKTOTAL, CKMB, CKMBINDEX, TROPONINI in the last 168 hours. BNP: Invalid input(s): POCBNP CBG: No results for input(s): GLUCAP in the last 168 hours.  Significant Diagnostic Studies:  Ct Abdomen Pelvis W Contrast  11/05/2014   CLINICAL DATA:  Left lower quadrant abdominal pain the M1 week ago. Vomiting and diarrhea. Recent hospitalization for similar symptoms. History of Crohn's disease.  Personal history of subtotal colectomy.  EXAM: CT ABDOMEN AND PELVIS WITH CONTRAST  TECHNIQUE: Multidetector CT imaging of the abdomen and pelvis was performed using the standard protocol following bolus administration of intravenous contrast.  CONTRAST:  OMNIPAQUE IOHEXOL 300 MG/ML  SOLN  COMPARISON:  CT abdomen and pelvis 10/25/2014.  FINDINGS: Minimal atelectasis is present at the lung bases bilaterally. The previously noted medial right lower lobe nodule is no longer evident. A smaller peripheral nodule posteriorly measures 3 mm on image 7 of series 3. The rapid change suggests this was inflammatory as is the smaller nodule.  The heart size is normal. No significant pleural or pericardial effusion is present.  The liver and spleen are within normal limits. The stomach, duodenum, and pancreas are unremarkable. The common bile duct and gallbladder are normal. The adrenal glands are normal bilaterally. The kidneys and ureters are within normal limits bilaterally. Urinary bladder is unremarkable.  The rectosigmoid colon is within normal limits. The distal anastomosis is unremarkable. There is no evidence for leak. The small bowel is not particularly dilated. Adhesion is again suggested anteriorly near the umbilicus. The distal small bowel is mostly collapsed. Previously noted partial obstruction at the anastomosis is not evident on today's study.  There is no significant free fluid or adenopathy.  Bone windows are unremarkable. Slight anterolisthesis at L5-S1 is stable.  IMPRESSION: 1. Subtotal colectomy with significant improvement of previously seen thickening and mild dilation at the ileocolonic anastomosis. 2. Slight caliber change in small bowel more proximally suggesting adhesions near the umbilicus. 3. No acute or focal lesion to explain acute pain otherwise. 4. Improved appearance of small nodules in the medial right lower lobe. These are likely inflammatory.   Electronically Signed   By: Marin Roberts M.D.   On: 11/05/2014 16:52   Dg Abd Acute W/chest  11/05/2014   CLINICAL DATA:  29 year old male with left-sided abdominal pain nausea and vomiting history of Crohn's disease  EXAM: DG ABDOMEN ACUTE W/ 1V CHEST  COMPARISON:  MRI dated 10/31/2014 and CT dated 10/25/2014  FINDINGS: There is no bowel obstruction or dilatation. No radiopaque calculi identified. Multiple surgical clips noted in the left upper abdomen. Bowel anastomotic sutures noted in the pelvis. The osseous structures appear unremarkable.  IMPRESSION: No evidence of  bowel obstruction.  No free air.   Electronically Signed   By: Elgie Collard M.D.   On: 11/05/2014 02:11    2D ECHO:   Disposition and Follow-up: Discharge Instructions    Discharge instructions    Complete by:  As directed   Soft diet  Please continue prednisone with taper 5 mg weekly. 40mg  x 1week, then taper 35 mg (3.5tabs) x1 week, then 30mg  (3tabs) x 1 week, then 25mg  (2.5 tabs) x 1 week, then 20mg  (2tabs) x 1 week, then 15mg  (1.5 tab) x 1 week, then 10mg  (1 tab) x 1 week, then OFF     Increase activity slowly    Complete by:  As directed             DISPOSITION: Home   DISCHARGE FOLLOW-UP Follow-up Information    Follow up with [facility] On 11/17/2014.   Why:  Follow up is at the [facility] [address] on Monday the 26th at 3:00pm.   Contact information:   [address] [place] [address] [phone]      Schedule an appointment as soon as possible for a visit to follow up.   Why:  for hospital follow-up asap    Contact information:   Please follow with you gastroenterologist at [facility], [place], [place]       Time spent on Discharge: 35 minutes  Signed:   RAI,RIPUDEEP M.D. [facility] 11/09/2014, 12:24 PM Pager: [phone]

## 2014-11-11 ENCOUNTER — Emergency Department (HOSPITAL_COMMUNITY)
Admission: EM | Admit: 2014-11-11 | Discharge: 2014-11-11 | Disposition: A | Discharge status: Home / Self Care | Payer: Medicare Other | Attending: Emergency Medicine | Admitting: Emergency Medicine

## 2014-11-11 ENCOUNTER — Encounter (HOSPITAL_COMMUNITY): Payer: Self-pay | Admitting: Emergency Medicine

## 2014-11-11 DIAGNOSIS — R000 Tachycardia, unspecified: Secondary | ICD-10-CM | POA: Diagnosis not present

## 2014-11-11 DIAGNOSIS — F419 Anxiety disorder, unspecified: Secondary | ICD-10-CM | POA: Diagnosis not present

## 2014-11-11 DIAGNOSIS — Z79899 Other long term (current) drug therapy: Secondary | ICD-10-CM | POA: Insufficient documentation

## 2014-11-11 DIAGNOSIS — Z87891 Personal history of nicotine dependence: Secondary | ICD-10-CM | POA: Insufficient documentation

## 2014-11-11 DIAGNOSIS — F319 Bipolar disorder, unspecified: Secondary | ICD-10-CM | POA: Insufficient documentation

## 2014-11-11 DIAGNOSIS — R1032 Left lower quadrant pain: Secondary | ICD-10-CM | POA: Diagnosis present

## 2014-11-11 DIAGNOSIS — K5090 Crohn's disease, unspecified, without complications: Secondary | ICD-10-CM | POA: Insufficient documentation

## 2014-11-11 DIAGNOSIS — D649 Anemia, unspecified: Secondary | ICD-10-CM | POA: Diagnosis not present

## 2014-11-11 DIAGNOSIS — K50919 Crohn's disease, unspecified, with unspecified complications: Secondary | ICD-10-CM

## 2014-11-11 DIAGNOSIS — R1110 Vomiting, unspecified: Secondary | ICD-10-CM

## 2014-11-11 DIAGNOSIS — R197 Diarrhea, unspecified: Secondary | ICD-10-CM

## 2014-11-11 LAB — URINALYSIS, ROUTINE W REFLEX MICROSCOPIC
Bilirubin Urine: NEGATIVE
GLUCOSE, UA: NEGATIVE mg/dL
HGB URINE DIPSTICK: NEGATIVE
KETONES UR: NEGATIVE mg/dL
Leukocytes, UA: NEGATIVE
Nitrite: NEGATIVE
PROTEIN: 30 mg/dL — AB
Specific Gravity, Urine: 1.04 — ABNORMAL HIGH (ref 1.005–1.030)
UROBILINOGEN UA: 0.2 mg/dL (ref 0.0–1.0)
pH: 6 (ref 5.0–8.0)

## 2014-11-11 LAB — CBC
HCT: 41.6 % (ref 39.0–52.0)
Hemoglobin: 13.4 g/dL (ref 13.0–17.0)
MCH: 25.6 pg — ABNORMAL LOW (ref 26.0–34.0)
MCHC: 32.2 g/dL (ref 30.0–36.0)
MCV: 79.5 fL (ref 78.0–100.0)
Platelets: 261 K/uL (ref 150–400)
RBC: 5.23 MIL/uL (ref 4.22–5.81)
RDW: 17.2 % — ABNORMAL HIGH (ref 11.5–15.5)
WBC: 10.5 K/uL (ref 4.0–10.5)

## 2014-11-11 LAB — COMPREHENSIVE METABOLIC PANEL
ALK PHOS: 62 U/L (ref 38–126)
ALT: 21 U/L (ref 17–63)
ANION GAP: 9 (ref 5–15)
AST: 18 U/L (ref 15–41)
Albumin: 4.3 g/dL (ref 3.5–5.0)
BILIRUBIN TOTAL: 1.7 mg/dL — AB (ref 0.3–1.2)
BUN: 10 mg/dL (ref 6–20)
CALCIUM: 9 mg/dL (ref 8.9–10.3)
CO2: 21 mmol/L — AB (ref 22–32)
CREATININE: 0.75 mg/dL (ref 0.61–1.24)
Chloride: 107 mmol/L (ref 101–111)
GFR calc non Af Amer: >60 mL/min (ref >60)
GLUCOSE: 89 mg/dL (ref 65–99)
Potassium: 3.6 mmol/L (ref 3.5–5.1)
Sodium: 137 mmol/L (ref 135–145)
TOTAL PROTEIN: 7 g/dL (ref 6.5–8.1)

## 2014-11-11 LAB — URINE MICROSCOPIC-ADD ON

## 2014-11-11 LAB — I-STAT CG4 LACTIC ACID, ED: LACTIC ACID, VENOUS: 0.92 mmol/L (ref 0.5–2.0)

## 2014-11-11 LAB — LIPASE, BLOOD: Lipase: 20 U/L — ABNORMAL LOW (ref 22–51)

## 2014-11-11 MED ORDER — HYDROCODONE-ACETAMINOPHEN 5-325 MG PO TABS
2.0000 | ORAL_TABLET | Freq: Once | ORAL | Status: AC
  Administered 2014-11-11: 2 via ORAL
  Filled 2014-11-11: qty 2

## 2014-11-11 MED ORDER — HYDROCODONE-ACETAMINOPHEN 5-325 MG PO TABS: 1.0000 | ORAL_TABLET | ORAL | Status: DC | PRN

## 2014-11-11 MED ORDER — ONDANSETRON HCL 4 MG/2ML IJ SOLN
4.0000 mg | Freq: Once | INTRAMUSCULAR | Status: AC
  Administered 2014-11-11: 4 mg via INTRAVENOUS
  Filled 2014-11-11: qty 2

## 2014-11-11 MED ORDER — HYDROMORPHONE HCL 1 MG/ML IJ SOLN
1.0000 mg | Freq: Once | INTRAMUSCULAR | Status: AC
  Administered 2014-11-11: 1 mg via INTRAVENOUS
  Filled 2014-11-11: qty 1

## 2014-11-11 MED ORDER — PROMETHAZINE HCL 25 MG/ML IJ SOLN
25.0000 mg | Freq: Once | INTRAMUSCULAR | Status: AC
  Administered 2014-11-11: 25 mg via INTRAVENOUS
  Filled 2014-11-11: qty 1

## 2014-11-11 MED ORDER — PROMETHAZINE HCL 25 MG RE SUPP: 25.0000 mg | Freq: Four times a day (QID) | RECTAL | Status: DC | PRN

## 2014-11-11 MED ORDER — PROMETHAZINE HCL 25 MG PO TABS: 25.0000 mg | ORAL_TABLET | Freq: Four times a day (QID) | ORAL | Status: DC | PRN

## 2014-11-11 NOTE — ED Notes (Signed)
2nd RN unable to obtain access, MD aware.

## 2014-11-11 NOTE — ED Notes (Signed)
Pt is in stable condition upon d/c and is escorted from ED via wheelchair. 

## 2014-11-11 NOTE — ED Notes (Signed)
Phlebotomy at bedside.

## 2014-11-11 NOTE — ED Notes (Signed)
Patient states he was just released from the hospital on Sunday for a Crohn's flare.   Patient states he has not been able to keep down solid foods and is still having abdominal pain.

## 2014-11-11 NOTE — ED Notes (Signed)
MD attempting US IV at this time. 

## 2014-11-11 NOTE — ED Notes (Signed)
RN attempt IV, unsucessful, another RN to attempt.

## 2014-11-11 NOTE — ED Notes (Signed)
IV team at bedside 

## 2014-11-11 NOTE — ED Provider Notes (Signed)
CSN: [id number]     Arrival date & time 11/11/14  0847 History   First MD Initiated Contact with Patient 11/11/14 0914     Chief Complaint  Patient presents with  . 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 He is unable to keep down his medications or any solid food. He states occasionally he can keep down liquids. He saw Dr. Adela Lank with GI while in the hospital who recommended that if symptoms do not improve he may need a surgery. Patient initially was planning on going back to [place] where he received the majority of his specialist care however on discussing with him today he is planning on relocating here permanently.   Last month since he's been in [place] he's been hospitalized 4 times. He denies any bloody stools, fever, cough or shortness of breath. His pain is in his typical spot in the left side of the abdomen.  Patient is a 29 y.o. male presenting with abdominal pain. The history is provided by the patient.  Abdominal Pain   Past Medical History  Diagnosis Date  . Anxiety   . Bipolar depression   . Crohn disease 1997  . Anemia     Around 2013 required PRBC transfusions. Has received parenteral iron in past. Has taken oral iron in past.   Past Surgical History  Procedure Laterality Date  . Subtotal colectomy      Has undergone a total of 3 separate bowel resections including terminal ileal resection and the equivalent of subtotal colectomy. Last surgery was in 2010.  Marland Kitchen Flexible  sigmoidoscopy N/A 10/29/2014    Procedure: FLEXIBLE SIGMOIDOSCOPY;  Surgeon: Ruffin Frederick, MD;  Location: [facility] ENDOSCOPY;  Service: Gastroenterology;  Laterality: N/A;   Family History  Problem Relation Age of Onset  . Hypertension Father    Social History  Substance Use Topics  . Smoking status: Former Smoker -- 0.00 packs/day for 5 years    Quit date: 11/04/2014  . Smokeless tobacco: Never Used  . Alcohol Use: No    Review of Systems  Gastrointestinal: Positive for abdominal pain.  All other systems reviewed and are negative.     Allergies  Remicade; Compazine; Ketorolac; Lithium; Morphine; Nsaids; Ciprofloxacin; and Metronidazole  Home Medications   Prior to Admission medications   Medication Sig Start Date End Date Taking? Authorizing Provider  ALPRAZolam Prudy Feeler) 0.5 MG tablet Take 0.5 mg by mouth 2 (two) times daily.   Yes Historical Provider, MD  amitriptyline (ELAVIL) 50 MG tablet Take 1 tablet (50 mg total) by mouth at bedtime. 11/09/14  Yes Ripudeep Jenna Luo, MD  azaTHIOprine (IMURAN) 50 MG tablet Take 175 mg by mouth daily.   Yes Historical Provider, MD  cholestyramine light (PREVALITE) 4 G packet Take 1 packet (4 g total) by mouth 2 (two) times daily. 11/09/14  Yes Ripudeep Jenna Luo, MD  dicyclomine (BENTYL) 20 MG tablet Take 1 tablet (20 mg total) by mouth 3 (three) times daily as needed for spasms. 11/09/14  Yes Ripudeep Jenna Luo, MD  Ferrous Sulfate (IRON) 325 (65 FE) MG TABS Take 325 mg by mouth daily.  Yes Historical Provider, MD  gabapentin (NEURONTIN) 300 MG capsule Take 900 mg by mouth 3 (three) times daily.   Yes Historical Provider, MD  HYDROcodone-acetaminophen (NORCO/VICODIN) 5-325 MG per tablet Take 1-2 tablets by mouth every 4 (four) hours as needed for moderate pain. 11/09/14  Yes Ripudeep Jenna Luo, MD  hydrocortisone (ANUSOL-HC) 25 MG suppository Place 1 suppository (25 mg total) rectally 2 (two) times daily as needed (rectal pain). 11/02/14  Yes Renae Fickle,  MD  lidocaine (LIDODERM) 5 % Place 1 patch onto the skin daily. Remove & Discard patch within 12 hours or as directed by MD 11/09/14  Yes Ripudeep Jenna Luo, MD  loperamide (IMODIUM) 2 MG capsule Take 2 capsules (4 mg total) by mouth as needed for diarrhea or loose stools (over the counter. Take if diarrhea uncontrolled.). 11/09/14  Yes Ripudeep Jenna Luo, MD  Magnesium 300 MG CAPS Take 300 mg by mouth daily.   Yes Historical Provider, MD  ondansetron (ZOFRAN ODT) 4 MG disintegrating tablet Take 1 tablet (4 mg total) by mouth every 8 (eight) hours as needed for nausea or vomiting. 11/02/14  Yes Renae Fickle, MD  predniSONE (DELTASONE) 10 MG tablet Take prednisone 40 mg (4 tabs) for a week, then taper by 5 mg weekly until off. 11/09/14  Yes Ripudeep K Rai, MD   BP 117/75 mmHg  Pulse 114  Temp(Src) 98.7 F (37.1 C) (Oral)  Resp 22  SpO2 97% Physical Exam  Constitutional: He is oriented to person, place, and time. He appears well-developed and well-nourished. No distress.  HENT:  Head: Normocephalic and atraumatic.  Mouth/Throat: Oropharynx is clear and moist. Mucous membranes are dry.  Eyes: Conjunctivae and EOM are normal. Pupils are equal, round, and reactive to light.  Neck: Normal range of motion. Neck supple.  Cardiovascular: Regular rhythm and intact distal pulses.  Tachycardia present.   No murmur heard. Pulmonary/Chest: Effort normal and breath sounds normal. No respiratory distress. He has no wheezes. He has no rales.  Abdominal: Soft. He exhibits no distension. Bowel sounds are decreased. There is tenderness in the left upper quadrant and left lower quadrant. There is guarding. There is no rebound and no CVA tenderness.    Musculoskeletal: Normal range of motion. He exhibits no edema or tenderness.  Neurological: He is alert and oriented to person, place, and time.  Skin: Skin is warm and dry. No rash noted. No erythema.  Psychiatric: He has a normal mood and affect. His behavior is  normal.  Nursing note and vitals reviewed.   ED Course  Procedures (including critical care time) Labs Review Labs Reviewed  LIPASE, BLOOD - Abnormal; Notable for the following:    Lipase 20 (*)    All other components within normal limits  COMPREHENSIVE METABOLIC PANEL - Abnormal; Notable for the following:    CO2 21 (*)    Total Bilirubin 1.7 (*)    All other components within normal limits  CBC - Abnormal; Notable for the following:    MCH 25.6 (*)    RDW 17.2 (*)    All other components within normal limits  URINALYSIS, ROUTINE W REFLEX MICROSCOPIC (NOT AT [facility]) - Abnormal; Notable for the following:    APPearance HAZY (*)    Specific Gravity, Urine 1.040 (*)    Protein, ur 30 (*)    All other components within normal limits  URINE MICROSCOPIC-ADD ON  I-STAT CG4 LACTIC ACID, ED    Imaging Review No results found. I have personally reviewed and  evaluated these images and lab results as part of my medical decision-making.   EKG Interpretation None      MDM   Final diagnoses:  None    Patient with multiple medical problems who was recently hospitalized 2 days ago for persistent nausea vomiting diarrhea, lactic acidosis thought to be due to a Crohn's flare who returns today for persistent nausea vomiting and diarrhea and unable to hold down his oral meds. He has pain medication, Flagyl, prednisone and Imuran at home however he states only rarely can keep him down. He saw Dr. Adela Lank with GI while in the hospital who recommended that he may need another surgery if symptoms do not improve. Patient does not notice any improvement with the steroid. Patient denies any new infectious symptoms. While in the hospital he received a CT which was improved based on his prior study. On exam today he has left sided abdominal tenderness, mild tachycardia and dry mucous membranes.  Patient given IV fluids pain and nausea control. CBC, CMP, lipase, UA, lactic acid pending.   2:34  PM Labs today are within normal limits. Patient has been hemodynamically stable. He has had no vomiting here and on further questioning he is holding down Gatorade at home. Discussed with him based on his lab results today he does not meet any criteria for admission. Patient has an appointment with the [facility] next week.  We'll try Phenergan suppositories and oral meds for vomiting. Patient given a prescription for his pain medicine that should last until his appointment next week.  Gwyneth Sprout, MD 11/11/14 1435

## 2014-11-15 ENCOUNTER — Emergency Department (HOSPITAL_COMMUNITY)
Admission: EM | Admit: 2014-11-15 | Discharge: 2014-11-15 | Disposition: A | Discharge status: Home / Self Care | Payer: Medicare Other | Attending: Emergency Medicine | Admitting: Emergency Medicine

## 2014-11-15 ENCOUNTER — Encounter (HOSPITAL_COMMUNITY): Payer: Self-pay

## 2014-11-15 ENCOUNTER — Emergency Department (HOSPITAL_COMMUNITY): Payer: Medicare Other

## 2014-11-15 DIAGNOSIS — F319 Bipolar disorder, unspecified: Secondary | ICD-10-CM | POA: Insufficient documentation

## 2014-11-15 DIAGNOSIS — Z87891 Personal history of nicotine dependence: Secondary | ICD-10-CM | POA: Diagnosis not present

## 2014-11-15 DIAGNOSIS — F419 Anxiety disorder, unspecified: Secondary | ICD-10-CM | POA: Insufficient documentation

## 2014-11-15 DIAGNOSIS — D649 Anemia, unspecified: Secondary | ICD-10-CM | POA: Insufficient documentation

## 2014-11-15 DIAGNOSIS — R109 Unspecified abdominal pain: Secondary | ICD-10-CM

## 2014-11-15 DIAGNOSIS — R197 Diarrhea, unspecified: Secondary | ICD-10-CM

## 2014-11-15 DIAGNOSIS — Z79899 Other long term (current) drug therapy: Secondary | ICD-10-CM | POA: Insufficient documentation

## 2014-11-15 DIAGNOSIS — R1032 Left lower quadrant pain: Secondary | ICD-10-CM | POA: Diagnosis not present

## 2014-11-15 DIAGNOSIS — R1012 Left upper quadrant pain: Secondary | ICD-10-CM | POA: Diagnosis not present

## 2014-11-15 LAB — CBC WITH DIFFERENTIAL/PLATELET
BASOS ABS: 0.1 10*3/uL (ref 0.0–0.1)
BASOS PCT: 1 %
Eosinophils Absolute: 0.1 10*3/uL (ref 0.0–0.7)
Eosinophils Relative: 1 %
HEMATOCRIT: 42.6 % (ref 39.0–52.0)
Hemoglobin: 13.7 g/dL (ref 13.0–17.0)
LYMPHS PCT: 9 %
Lymphs Abs: 1.2 10*3/uL (ref 0.7–4.0)
MCH: 25.5 pg — ABNORMAL LOW (ref 26.0–34.0)
MCHC: 32.2 g/dL (ref 30.0–36.0)
MCV: 79.3 fL (ref 78.0–100.0)
MONO ABS: 0.8 10*3/uL (ref 0.1–1.0)
Monocytes Relative: 6 %
NEUTROS ABS: 11 10*3/uL — AB (ref 1.7–7.7)
Neutrophils Relative %: 83 %
PLATELETS: 308 10*3/uL (ref 150–400)
RBC: 5.37 MIL/uL (ref 4.22–5.81)
RDW: 17.7 % — AB (ref 11.5–15.5)
WBC: 13.1 10*3/uL — AB (ref 4.0–10.5)

## 2014-11-15 LAB — URINE MICROSCOPIC-ADD ON

## 2014-11-15 LAB — COMPREHENSIVE METABOLIC PANEL
ALBUMIN: 4.3 g/dL (ref 3.5–5.0)
ALT: 27 U/L (ref 17–63)
AST: 30 U/L (ref 15–41)
Alkaline Phosphatase: 72 U/L (ref 38–126)
Anion gap: 11 (ref 5–15)
BILIRUBIN TOTAL: 0.4 mg/dL (ref 0.3–1.2)
BUN: 15 mg/dL (ref 6–20)
CHLORIDE: 110 mmol/L (ref 101–111)
CO2: 21 mmol/L — ABNORMAL LOW (ref 22–32)
CREATININE: 0.88 mg/dL (ref 0.61–1.24)
Calcium: 10.4 mg/dL — ABNORMAL HIGH (ref 8.9–10.3)
GFR calc Af Amer: >60 mL/min (ref >60)
GFR calc non Af Amer: >60 mL/min (ref >60)
GLUCOSE: 102 mg/dL — AB (ref 65–99)
POTASSIUM: 3.9 mmol/L (ref 3.5–5.1)
Sodium: 142 mmol/L (ref 135–145)
Total Protein: 7.9 g/dL (ref 6.5–8.1)

## 2014-11-15 LAB — MAGNESIUM: Magnesium: 1.7 mg/dL (ref 1.7–2.4)

## 2014-11-15 LAB — URINALYSIS, ROUTINE W REFLEX MICROSCOPIC
Bilirubin Urine: NEGATIVE
GLUCOSE, UA: NEGATIVE mg/dL
HGB URINE DIPSTICK: NEGATIVE
Ketones, ur: 15 mg/dL — AB
LEUKOCYTES UA: NEGATIVE
Nitrite: NEGATIVE
PH: 6 (ref 5.0–8.0)
Protein, ur: 30 mg/dL — AB
SPECIFIC GRAVITY, URINE: 1.046 — AB (ref 1.005–1.030)
Urobilinogen, UA: 0.2 mg/dL (ref 0.0–1.0)

## 2014-11-15 MED ORDER — HYDROMORPHONE HCL 1 MG/ML IJ SOLN
1.0000 mg | Freq: Once | INTRAMUSCULAR | Status: AC
  Administered 2014-11-15: 1 mg via INTRAVENOUS
  Filled 2014-11-15: qty 1

## 2014-11-15 MED ORDER — HYDROCODONE-ACETAMINOPHEN 5-325 MG PO TABS
2.0000 | ORAL_TABLET | ORAL | Status: DC | PRN
Start: 2014-11-15 — End: 2015-05-29

## 2014-11-15 MED ORDER — SODIUM CHLORIDE 0.9 % IV BOLUS (SEPSIS)
1000.0000 mL | Freq: Once | INTRAVENOUS | Status: AC
  Administered 2014-11-15: 1000 mL via INTRAVENOUS

## 2014-11-15 MED ORDER — ONDANSETRON HCL 4 MG/2ML IJ SOLN
4.0000 mg | Freq: Once | INTRAMUSCULAR | Status: AC
  Administered 2014-11-15: 4 mg via INTRAVENOUS
  Filled 2014-11-15: qty 2

## 2014-11-15 MED ORDER — HYDROMORPHONE HCL 1 MG/ML IJ SOLN
2.0000 mg | Freq: Once | INTRAMUSCULAR | Status: AC
  Administered 2014-11-15: 2 mg via INTRAVENOUS
  Filled 2014-11-15: qty 2

## 2014-11-15 NOTE — ED Notes (Signed)
Pt offered 1 small can of soda; pt drank and reports no nausea or abdominal discomfort

## 2014-11-15 NOTE — ED Notes (Signed)
Patient transported to X-ray 

## 2014-11-15 NOTE — ED Notes (Signed)
Pt. Presents with complaint of abd pain starting around 0545. Pt. With hx of crohns. Pt. Ambulatory AxO x4.

## 2014-11-15 NOTE — ED Provider Notes (Signed)
CSN: [id number]     Arrival date & time 11/15/14  0757 History   First MD [id number] Contact with Patient 11/15/14 [phone]     Chief Complaint  Patient presents with  . Abdominal Pain     (Consider location/radiation/quality/duration/timing/severity/associated sxs/prior Treatment) Patient is a 29 y.o. male presenting with abdominal pain. The history is provided by the patient. No language interpreter was used.  Abdominal Pain Associated symptoms: diarrhea and vomiting   Associated symptoms: no fever   Andre [name] is a 29 year old male with a history of anxiety, bipolar depression, Crohn's disease, multiple bowel resections, and anemia who presents with abdominal pain, nausea, and 15+ episodes of diarrhea since his last ED visit 4 days ago. He states that he had his pain under control but ran out of his Vicodin yesterday. He has a GI appointment on Monday. He was told that they may do surgery and that he would follow-up with them to determine if this was related to adhesions versus nerve pain. If there were adhesions from previous surgery the plan was to have surgery again otherwise he would be referred to pain management for a nerve block. This would be a terminal by GI in 2 days. He is currently on Imuron and prednisone. He has been hospitalized 4 times in the past month. He denies any fever, chills, chest pain, shortness of breath, vomiting, hematochezia.  Past Medical History  Diagnosis Date  . Anxiety   . Bipolar depression   . Crohn disease 1997  . Anemia     Around 2013 required PRBC transfusions. Has received parenteral iron in past. Has taken oral iron in past.   Past Surgical History  Procedure Laterality Date  . Subtotal colectomy      Has undergone a total of 3 separate bowel resections including terminal ileal resection and the equivalent of subtotal colectomy. Last surgery was in 2010.  Marland Kitchen Flexible sigmoidoscopy N/A 10/29/2014    Procedure: FLEXIBLE SIGMOIDOSCOPY;  Surgeon:  Ruffin Frederick, MD;  Location: [facility] ENDOSCOPY;  Service: Gastroenterology;  Laterality: N/A;   Family History  Problem Relation Age of Onset  . Hypertension Father    Social History  Substance Use Topics  . Smoking status: Former Smoker -- 0.00 packs/day for 5 years    Quit date: 11/04/2014  . Smokeless tobacco: Never Used  . Alcohol Use: No    Review of Systems  Constitutional: Negative for fever.  Gastrointestinal: Positive for vomiting, abdominal pain and diarrhea.  All other systems reviewed and are negative.     Allergies  Remicade; Compazine; Ketorolac; Lithium; Morphine; Nsaids; Ciprofloxacin; and Metronidazole  Home Medications   Prior to Admission medications   Medication Sig Start Date End Date Taking? Authorizing Provider  ALPRAZolam Prudy Feeler) 0.5 MG tablet Take 0.5 mg by mouth 2 (two) times daily.   Yes Historical Provider, MD  amitriptyline (ELAVIL) 50 MG tablet Take 1 tablet (50 mg total) by mouth at bedtime. 11/09/14  Yes Ripudeep Jenna Luo, MD  azaTHIOprine (IMURAN) 50 MG tablet Take 175 mg by mouth daily.   Yes Historical Provider, MD  cholestyramine light (PREVALITE) 4 G packet Take 1 packet (4 g total) by mouth 2 (two) times daily. 11/09/14  Yes Ripudeep Jenna Luo, MD  dicyclomine (BENTYL) 20 MG tablet Take 1 tablet (20 mg total) by mouth 3 (three) times daily as needed for spasms. 11/09/14  Yes Ripudeep Jenna Luo, MD  Ferrous Sulfate (IRON) 325 (65 FE) MG TABS Take 325 mg by mouth daily.  Yes Historical Provider, MD  gabapentin (NEURONTIN) 300 MG capsule Take 900 mg by mouth 3 (three) times daily.   Yes Historical Provider, MD  hydrocortisone (ANUSOL-HC) 25 MG suppository Place 1 suppository (25 mg total) rectally 2 (two) times daily as needed (rectal pain). 11/02/14  Yes Renae Fickle, MD  lidocaine (LIDODERM) 5 % Place 1 patch onto the skin daily. Remove & Discard patch within 12 hours or as directed by MD 11/09/14  Yes Ripudeep Jenna Luo, MD  loperamide (IMODIUM) 2 MG  capsule Take 2 capsules (4 mg total) by mouth as needed for diarrhea or loose stools (over the counter. Take if diarrhea uncontrolled.). 11/09/14  Yes Ripudeep Jenna Luo, MD  Magnesium 300 MG CAPS Take 300 mg by mouth daily.   Yes Historical Provider, MD  ondansetron (ZOFRAN ODT) 4 MG disintegrating tablet Take 1 tablet (4 mg total) by mouth every 8 (eight) hours as needed for nausea or vomiting. 11/02/14  Yes Renae Fickle, MD  predniSONE (DELTASONE) 10 MG tablet Take prednisone 40 mg (4 tabs) for a week, then taper by 5 mg weekly until off. 11/09/14  Yes Ripudeep Jenna Luo, MD  promethazine (PHENERGAN) 25 MG suppository Place 1 suppository (25 mg total) rectally every 6 (six) hours as needed for nausea or vomiting. 11/11/14  Yes Gwyneth Sprout, MD  promethazine (PHENERGAN) 25 MG tablet Take 1 tablet (25 mg total) by mouth every 6 (six) hours as needed for nausea or vomiting. 11/11/14  Yes Gwyneth Sprout, MD  HYDROcodone-acetaminophen (NORCO/VICODIN) 5-325 MG per tablet Take 2 tablets by mouth every 4 (four) hours as needed. 11/15/14   Eternity Dexter Patel-Mills, PA-C   BP 133/73 mmHg  Pulse 87  Temp(Src) 98.8 F (37.1 C) (Oral)  Resp 18  SpO2 100% Physical Exam  Constitutional: He is oriented to person, place, and time. He appears well-developed and well-nourished. No distress.  HENT:  Head: Normocephalic and atraumatic.  Eyes: Conjunctivae are normal.  Neck: Normal range of motion. Neck supple.  Cardiovascular: Normal rate, regular rhythm and normal heart sounds.   Pulmonary/Chest: Effort normal and breath sounds normal. No respiratory distress. He has no wheezes.  Abdominal: Soft. He exhibits no distension. There is tenderness in the left upper quadrant and left lower quadrant. There is no guarding.  Large vertical midline abdominal scar. Tenderness to palpation along the left upper and lower quadrants. No abdominal distention or guarding.  Musculoskeletal: Normal range of motion.  Neurological: He is  alert and oriented to person, place, and time.  Skin: Skin is warm and dry.  Psychiatric: He has a normal mood and affect. His behavior is normal.  Nursing note and vitals reviewed.   ED Course  Procedures (including critical care time) Labs Review Labs Reviewed  CBC WITH DIFFERENTIAL/PLATELET - Abnormal; Notable for the following:    WBC 13.1 (*)    MCH 25.5 (*)    RDW 17.7 (*)    Neutro Abs 11.0 (*)    All other components within normal limits  COMPREHENSIVE METABOLIC PANEL - Abnormal; Notable for the following:    CO2 21 (*)    Glucose, Bld 102 (*)    Calcium 10.4 (*)    All other components within normal limits  URINALYSIS, ROUTINE W REFLEX MICROSCOPIC (NOT AT [facility]) - Abnormal; Notable for the following:    Specific Gravity, Urine 1.046 (*)    Ketones, ur 15 (*)    Protein, ur 30 (*)    All other components within normal limits  URINE  MICROSCOPIC-ADD ON - Abnormal; Notable for the following:    Crystals CA OXALATE CRYSTALS (*)    All other components within normal limits  MAGNESIUM    Imaging Review Dg Abd 1 View  11/15/2014   CLINICAL DATA:  Chronic left lower quadrant pain, worse this morning. Diarrhea and nausea. History of Crohn's disease in prior colectomy.  EXAM: ABDOMEN - 1 VIEW  COMPARISON:  CT abdomen and pelvis and abdominal radiographs 11/05/2014  FINDINGS: Anastomotic staple line is noted in the lower abdomen/ pelvis. Left upper quadrant abdominal surgical clips are present. There is a single prominent loop of gas-filled bowel in the central abdomen, without other dilated loops seen. No acute osseous abnormality is identified.  IMPRESSION: Single prominent loop of bowel in the central abdomen superior to the anastomosis. This may reflect postsurgical change and regional ileus. Developing obstruction is not completely excluded, however there is no other evidence of obstruction on this study.   Electronically Signed   By: Sebastian Ache M.D.   On: 11/15/2014 10:59   I  have personally reviewed and evaluated these imaging and lab results as part of my medical decision-making.   EKG Interpretation None      MDM   Final diagnoses:  Left sided abdominal pain  Diarrhea   Patient with a history of multiple bowel resections and Crohn's disease who presents for left-sided abdominal pain, nausea, and several episodes of diarrhea. He is well-appearing, afebrile, and vitals are stable. His labs are not concerning at this time. An abdominal x-ray was obtained to rule out small bowel obstruction. Medications  sodium chloride 0.9 % bolus 1,000 mL (0 mLs Intravenous Stopped 11/15/14 1030)  ondansetron (ZOFRAN) injection 4 mg (4 mg Intravenous Given 11/15/14 0911)  HYDROmorphone (DILAUDID) injection 1 mg (1 mg Intravenous Given 11/15/14 0911)  HYDROmorphone (DILAUDID) injection 2 mg (2 mg Intravenous Given 11/15/14 1007)   Filed Vitals:   11/15/14 1215  BP: 133/73  Pulse: 87  Temp:   Resp:    Recheck: Patient stated he is feeling better after Dilaudid. He is able to tolerate by mouth fluids. I discussed return precautions with the patient such as fever, vomiting, inability to pass stool or gas, or increased abdominal pain. I discussed keeping his follow-up appointment with GI in 2 days. He was given #30 Vicodin four days ago for pain. He stated that he has finished these pills and is requesting more Vicodin. I discussed this with Dr. Freida Busman who agreed that he could have an additional #10 Vicodin until he is followed by GI on Monday.   Catha Gosselin, PA-C 11/15/14 1224  Lorre Nick, MD 11/18/14 [phone]

## 2014-11-15 NOTE — Discharge Instructions (Signed)
Abdominal Pain Return for fever, vomiting, inability to pass stool or gas, increased abdominal pain. Follow-up with gastroenterology on Monday as scheduled. Many things can cause abdominal pain. Usually, abdominal pain is not caused by a disease and will improve without treatment. It can often be observed and treated at home. Your health care provider will do a physical exam and possibly order blood tests and X-rays to help determine the seriousness of your pain. However, in many cases, more time must pass before a clear cause of the pain can be found. Before that point, your health care provider may not know if you need more testing or further treatment. HOME CARE INSTRUCTIONS  Monitor your abdominal pain for any changes. The following actions may help to alleviate any discomfort you are experiencing:  Only take over-the-counter or prescription medicines as directed by your health care provider.  Do not take laxatives unless directed to do so by your health care provider.  Try a clear liquid diet (broth, tea, or water) as directed by your health care provider. Slowly move to a bland diet as tolerated. SEEK MEDICAL CARE IF:  You have unexplained abdominal pain.  You have abdominal pain associated with nausea or diarrhea.  You have pain when you urinate or have a bowel movement.  You experience abdominal pain that wakes you in the night.  You have abdominal pain that is worsened or improved by eating food.  You have abdominal pain that is worsened with eating fatty foods.  You have a fever. SEEK IMMEDIATE MEDICAL CARE IF:   Your pain does not go away within 2 hours.  You keep throwing up (vomiting).  Your pain is felt only in portions of the abdomen, such as the right side or the left lower portion of the abdomen.  You pass bloody or black tarry stools. MAKE SURE YOU:  Understand these instructions.   Will watch your condition.   Will get help right away if you are not doing  well or get worse.  Document Released: 11/17/2004 Document Revised: 02/12/2013 Document Reviewed: 10/17/2012 [facility] Patient Information 2015 [place], [place]. This information is not intended to replace advice given to you by your health care provider. Make sure you discuss any questions you have with your health care provider.

## 2014-11-15 NOTE — ED Notes (Signed)
Pt remains monitored by blood pressure and pulse ox.  

## 2014-11-16 ENCOUNTER — Encounter (HOSPITAL_COMMUNITY): Payer: Self-pay | Admitting: Emergency Medicine

## 2014-11-16 ENCOUNTER — Emergency Department (HOSPITAL_COMMUNITY)
Admission: EM | Admit: 2014-11-16 | Discharge: 2014-11-16 | Disposition: A | Discharge status: Home / Self Care | Payer: Medicare Other | Attending: Emergency Medicine | Admitting: Emergency Medicine

## 2014-11-16 DIAGNOSIS — D649 Anemia, unspecified: Secondary | ICD-10-CM | POA: Insufficient documentation

## 2014-11-16 DIAGNOSIS — K5090 Crohn's disease, unspecified, without complications: Secondary | ICD-10-CM | POA: Diagnosis not present

## 2014-11-16 DIAGNOSIS — F419 Anxiety disorder, unspecified: Secondary | ICD-10-CM | POA: Insufficient documentation

## 2014-11-16 DIAGNOSIS — F319 Bipolar disorder, unspecified: Secondary | ICD-10-CM | POA: Diagnosis not present

## 2014-11-16 DIAGNOSIS — Z87891 Personal history of nicotine dependence: Secondary | ICD-10-CM | POA: Insufficient documentation

## 2014-11-16 DIAGNOSIS — R1032 Left lower quadrant pain: Secondary | ICD-10-CM

## 2014-11-16 DIAGNOSIS — Z79899 Other long term (current) drug therapy: Secondary | ICD-10-CM | POA: Diagnosis not present

## 2014-11-16 DIAGNOSIS — K50119 Crohn's disease of large intestine with unspecified complications: Secondary | ICD-10-CM

## 2014-11-16 LAB — COMPREHENSIVE METABOLIC PANEL
ALT: 24 U/L (ref 17–63)
ANION GAP: 6 (ref 5–15)
AST: 22 U/L (ref 15–41)
Albumin: 4 g/dL (ref 3.5–5.0)
Alkaline Phosphatase: 63 U/L (ref 38–126)
BUN: 13 mg/dL (ref 6–20)
CHLORIDE: 111 mmol/L (ref 101–111)
CO2: 24 mmol/L (ref 22–32)
Calcium: 8.7 mg/dL — ABNORMAL LOW (ref 8.9–10.3)
Creatinine, Ser: 0.75 mg/dL (ref 0.61–1.24)
Glucose, Bld: 92 mg/dL (ref 65–99)
POTASSIUM: 3.5 mmol/L (ref 3.5–5.1)
SODIUM: 141 mmol/L (ref 135–145)
Total Bilirubin: 0.5 mg/dL (ref 0.3–1.2)
Total Protein: 6.8 g/dL (ref 6.5–8.1)

## 2014-11-16 LAB — CBC WITH DIFFERENTIAL/PLATELET
Basophils Absolute: 0 10*3/uL (ref 0.0–0.1)
Basophils Relative: 0 %
EOS ABS: 0.1 10*3/uL (ref 0.0–0.7)
EOS PCT: 1 %
HCT: 36.6 % — ABNORMAL LOW (ref 39.0–52.0)
Hemoglobin: 11.6 g/dL — ABNORMAL LOW (ref 13.0–17.0)
LYMPHS ABS: 1.1 10*3/uL (ref 0.7–4.0)
Lymphocytes Relative: 12 %
MCH: 25.1 pg — AB (ref 26.0–34.0)
MCHC: 31.7 g/dL (ref 30.0–36.0)
MCV: 79.2 fL (ref 78.0–100.0)
MONO ABS: 0.5 10*3/uL (ref 0.1–1.0)
Monocytes Relative: 5 %
Neutro Abs: 7.5 10*3/uL (ref 1.7–7.7)
Neutrophils Relative %: 82 %
PLATELETS: 259 10*3/uL (ref 150–400)
RBC: 4.62 MIL/uL (ref 4.22–5.81)
RDW: 17.6 % — AB (ref 11.5–15.5)
WBC: 9.1 10*3/uL (ref 4.0–10.5)

## 2014-11-16 MED ORDER — SODIUM CHLORIDE 0.9 % IV BOLUS (SEPSIS): 1000.0000 mL | Freq: Once | INTRAVENOUS | Status: DC

## 2014-11-16 MED ORDER — ONDANSETRON HCL 4 MG/2ML IJ SOLN
4.0000 mg | Freq: Once | INTRAMUSCULAR | Status: AC
  Administered 2014-11-16: 4 mg via INTRAVENOUS
  Filled 2014-11-16: qty 2

## 2014-11-16 MED ORDER — HYDROMORPHONE HCL 1 MG/ML IJ SOLN
1.0000 mg | Freq: Once | INTRAMUSCULAR | Status: AC
  Administered 2014-11-16: 1 mg via INTRAVENOUS
  Filled 2014-11-16: qty 1

## 2014-11-16 NOTE — ED Provider Notes (Signed)
CSN: [id number]     Arrival date & time 11/16/14  1435 History   First MD Initiated Contact with Patient 11/16/14 1500     Chief Complaint  Patient presents with  . Abdominal Pain     (Consider location/radiation/quality/duration/timing/severity/associated sxs/prior Treatment) HPI   Andre [name] is a 29 y.o. male with PMH significant for crohn disease (s/p subtotal colectomy 2010, flex sig 10/29/2014 who was seen in the ED yesterday for the same complaints of abdominal pain and N/V.  This is now his 5th ED visit for the same complaints.  Pt was d/c with vicodin and PCP follow up on Monday.  However, pt returns today stating that the vicodin does not touch his pain.  Endorses N/V, intermittent hematochezia, and 15+ episodes of diarrhea per day.  This is unchanged from his visit yesterday.  Denies fevers, chills, HA, neck pain, CP, SOB, hematuria, dysuria, or increased urinary frequency.  GI suspects LLQ/LUQ abdominal pain is from crohn's disease and possible neuropathic pain from previous surgeries.  Past Medical History  Diagnosis Date  . Anxiety   . Bipolar depression   . Crohn disease 1997  . Anemia     Around 2013 required PRBC transfusions. Has received parenteral iron in past. Has taken oral iron in past.   Past Surgical History  Procedure Laterality Date  . Subtotal colectomy      Has undergone a total of 3 separate bowel resections including terminal ileal resection and the equivalent of subtotal colectomy. Last surgery was in 2010.  Marland Kitchen Flexible sigmoidoscopy N/A 10/29/2014    Procedure: FLEXIBLE SIGMOIDOSCOPY;  Surgeon: Ruffin Frederick, MD;  Location: [facility] ENDOSCOPY;  Service: Gastroenterology;  Laterality: N/A;   Family History  Problem Relation Age of Onset  . Hypertension Father    Social History  Substance Use Topics  . Smoking status: Former Smoker -- 0.00 packs/day for 5 years    Quit date: 11/04/2014  . Smokeless tobacco: Never Used  . Alcohol Use: No     Review of Systems All other systems negative unless otherwise stated in HPI    Allergies  Remicade; Compazine; Ketorolac; Lithium; Morphine; Nsaids; Ciprofloxacin; and Metronidazole  Home Medications   Prior to Admission medications   Medication Sig Start Date End Date Taking? Authorizing Provider  ALPRAZolam Prudy Feeler) 0.5 MG tablet Take 0.5 mg by mouth 2 (two) times daily.    Historical Provider, MD  amitriptyline (ELAVIL) 50 MG tablet Take 1 tablet (50 mg total) by mouth at bedtime. 11/09/14   Ripudeep Jenna Luo, MD  azaTHIOprine (IMURAN) 50 MG tablet Take 175 mg by mouth daily.    Historical Provider, MD  cholestyramine light (PREVALITE) 4 G packet Take 1 packet (4 g total) by mouth 2 (two) times daily. 11/09/14   Ripudeep Jenna Luo, MD  dicyclomine (BENTYL) 20 MG tablet Take 1 tablet (20 mg total) by mouth 3 (three) times daily as needed for spasms. 11/09/14   Ripudeep Jenna Luo, MD  Ferrous Sulfate (IRON) 325 (65 FE) MG TABS Take 325 mg by mouth daily.    Historical Provider, MD  gabapentin (NEURONTIN) 300 MG capsule Take 900 mg by mouth 3 (three) times daily.    Historical Provider, MD  HYDROcodone-acetaminophen (NORCO/VICODIN) 5-325 MG per tablet Take 2 tablets by mouth every 4 (four) hours as needed. 11/15/14   Hanna Patel-Mills, PA-C  hydrocortisone (ANUSOL-HC) 25 MG suppository Place 1 suppository (25 mg total) rectally 2 (two) times daily as needed (rectal pain). 11/02/14   Thea Silversmith  Short, MD  lidocaine (LIDODERM) 5 % Place 1 patch onto the skin daily. Remove & Discard patch within 12 hours or as directed by MD 11/09/14   Ripudeep Jenna Luo, MD  loperamide (IMODIUM) 2 MG capsule Take 2 capsules (4 mg total) by mouth as needed for diarrhea or loose stools (over the counter. Take if diarrhea uncontrolled.). 11/09/14   Ripudeep Jenna Luo, MD  Magnesium 300 MG CAPS Take 300 mg by mouth daily.    Historical Provider, MD  ondansetron (ZOFRAN ODT) 4 MG disintegrating tablet Take 1 tablet (4 mg total) by mouth  every 8 (eight) hours as needed for nausea or vomiting. 11/02/14   Renae Fickle, MD  predniSONE (DELTASONE) 10 MG tablet Take prednisone 40 mg (4 tabs) for a week, then taper by 5 mg weekly until off. 11/09/14   Ripudeep Jenna Luo, MD  promethazine (PHENERGAN) 25 MG suppository Place 1 suppository (25 mg total) rectally every 6 (six) hours as needed for nausea or vomiting. 11/11/14   Gwyneth Sprout, MD  promethazine (PHENERGAN) 25 MG tablet Take 1 tablet (25 mg total) by mouth every 6 (six) hours as needed for nausea or vomiting. 11/11/14   Gwyneth Sprout, MD   BP 126/86 mmHg  Pulse 84  Temp(Src) 98.4 F (36.9 C) (Oral)  Resp 18  Ht  (1.575 m)  Wt 150 lb (68.04 kg)  BMI 27.43 kg/m2  SpO2 100% Physical Exam  Constitutional: He is oriented to person, place, and time. He appears well-developed and well-nourished.  HENT:  Head: Normocephalic and atraumatic.  Mouth/Throat: Oropharynx is clear and moist.  Eyes: Pupils are equal, round, and reactive to light.  Neck: Normal range of motion. Neck supple.  Cardiovascular: Normal rate and regular rhythm.   No murmur heard. Pulmonary/Chest: Effort normal and breath sounds normal. No respiratory distress. He has no wheezes. He has no rales.  Abdominal: Soft. Bowel sounds are normal. He exhibits no distension. There is tenderness in the left upper quadrant and left lower quadrant. There is no rigidity, no rebound and no guarding.  Musculoskeletal: Normal range of motion.  Lymphadenopathy:    He has no cervical adenopathy.  Neurological: He is alert and oriented to person, place, and time.  Skin: Skin is warm and dry.  Psychiatric: He has a normal mood and affect. His behavior is normal.    ED Course  Procedures (including critical care time) Labs Review Labs Reviewed  CBC WITH DIFFERENTIAL/PLATELET - Abnormal; Notable for the following:    Hemoglobin 11.6 (*)    HCT 36.6 (*)    MCH 25.1 (*)    RDW 17.6 (*)    All other components  within normal limits  COMPREHENSIVE METABOLIC PANEL - Abnormal; Notable for the following:    Calcium 8.7 (*)    All other components within normal limits    Imaging Review Dg Abd 1 View  11/15/2014   CLINICAL DATA:  Chronic left lower quadrant pain, worse this morning. Diarrhea and nausea. History of Crohn's disease in prior colectomy.  EXAM: ABDOMEN - 1 VIEW  COMPARISON:  CT abdomen and pelvis and abdominal radiographs 11/05/2014  FINDINGS: Anastomotic staple line is noted in the lower abdomen/ pelvis. Left upper quadrant abdominal surgical clips are present. There is a single prominent loop of gas-filled bowel in the central abdomen, without other dilated loops seen. No acute osseous abnormality is identified.  IMPRESSION: Single prominent loop of bowel in the central abdomen superior to the anastomosis. This may reflect  postsurgical change and regional ileus. Developing obstruction is not completely excluded, however there is no other evidence of obstruction on this study.   Electronically Signed   By: Sebastian Ache M.D.   On: 11/15/2014 10:59   I have personally reviewed and evaluated these images and lab results as part of my medical decision-making.   EKG Interpretation None      MDM   Final diagnoses:  None   Pt presents with uncontrolled abdominal pain with N/V after being seen in the ED yesterday.  Nothing has changed since his visit yesterday.  No fevers, chills, CP, SOB.  Will obtain basic labs.  Will administer IVF, zofran, and dilaudid.  Will reassess.  Pt seen by gastroenterology during his admission earlier this month and they think his pain is multifactorial from abdominal wall / neuropathic pain from prior abdominal surgeries, narcotic bowel, and Crohns.  Labs stable, pt is hemodynamically stable.  No pain prescriptions given.  Pt to follow up tomorrow with PCP and encouraged to discuss GI issues for prompt GI referral.  Pt agrees and acknowledges the above plan for  discharge.  Case has been discussed with by Dr. Denton Lank who agrees with the above plan for discharge.     Cheri Fowler, PA-C 11/16/14 1705  Cathren Laine, MD 11/16/14 2147

## 2014-11-16 NOTE — ED Notes (Addendum)
Pt from home. Complains of L side abd pain. Hx of chron's; has had a flare up since 9/5. Tried home vicoden 2 hours ago with no relief. No n/v.

## 2014-11-16 NOTE — ED Notes (Signed)
Ophelia Shoulder, director, spoke at length with patient about why he would not be getting prescriptions and to continue his home medications. Writer went over paperwork with patient and told him to f/u with PCP or come back if anything got worse. Pt refused to sign paperwork because he did not agree with discharge plan. Pt was given a bus pass. Alert and oriented upon leaving.

## 2014-11-16 NOTE — Discharge Instructions (Signed)
Abdominal Pain Many things can cause belly (abdominal) pain. Most times, the belly pain is not dangerous. Many cases of belly pain can be watched and treated at home. HOME CARE   Do not take medicines that help you go poop (laxatives) unless told to by your doctor.  Only take medicine as told by your doctor.  Eat or drink as told by your doctor. Your doctor will tell you if you should be on a special diet. GET HELP IF:  You do not know what is causing your belly pain.  You have belly pain while you are sick to your stomach (nauseous) or have runny poop (diarrhea).  You have pain while you pee or poop.  Your belly pain wakes you up at night.  You have belly pain that gets worse or better when you eat.  You have belly pain that gets worse when you eat fatty foods.  You have a fever. GET HELP RIGHT AWAY IF:   The pain does not go away within 2 hours.  You keep throwing up (vomiting).  The pain changes and is only in the right or left part of the belly.  You have bloody or tarry looking poop. MAKE SURE YOU:   Understand these instructions.  Will watch your condition.  Will get help right away if you are not doing well or get worse. Document Released: 07/27/2007 Document Revised: 02/12/2013 Document Reviewed: 10/17/2012 [facility] Patient Information 2015 [place], [place]. This information is not intended to replace advice given to you by your health care provider. Make sure you discuss any questions you have with your health care provider.   Crohn Disease Crohn disease is a long-term (chronic) soreness and redness (inflammation) of the intestines (bowel). It can affect any portion of the digestive tract, from the mouth to the anus. It can also cause problems outside the digestive tract. Crohn disease is closely related to a disease called ulcerative colitis (together, these two diseases are called inflammatory bowel disease).  CAUSES  The cause of Crohn disease is not known.  One Andre [name] is that, in an easily affected person, the immune system is triggered to attack the body's own digestive tissue. Crohn disease runs in families. It seems to be more common in certain geographic areas and amongst certain races. There are no clear-cut dietary causes.  SYMPTOMS  Crohn disease can cause many different symptoms since it can affect many different parts of the body. Symptoms include:  Fatigue.  Weight loss.  Chronic diarrhea, sometime bloody.  Abdominal pain and cramps.  Fever.  Ulcers or canker sores in the mouth or rectum.  Anemia (low red blood cells).  Arthritis, skin problems, and eye problems may occur. Complications of Crohn disease can include:  Series of holes (perforation) of the bowel.  Portions of the intestines sticking to each other (adhesions).  Obstruction of the bowel.  Fistula formation, typically in the rectal area but also in other areas. A fistula is an opening between the bowels and the outside, or between the bowels and another organ.  A painful crack in the mucous membrane of the anus (rectal fissure). DIAGNOSIS  Your caregiver may suspect Crohn disease based on your symptoms and an exam. Blood tests may confirm that there is a problem. You may be asked to submit a stool specimen for examination. X-rays and CT scans may be necessary. Ultimately, the diagnosis is usually made after a procedure that uses a flexible tube that is inserted via your mouth or your  anus. This is done under sedation and is called either an upper endoscopy or colonoscopy. With these tests, the specialist can take tiny tissue samples and remove them from the inside of the bowel (biopsy). Examination of this biopsy tissue under a microscope can reveal Crohn disease as the cause of your symptoms. Due to the many different forms that Crohn disease can take, symptoms may be present for several years before a diagnosis is made. TREATMENT  Medications are often used to  decrease inflammation and control the immune system. These include medicines related to aspirin, steroid medications, and newer and stronger medications to slow down the immune system. Some medications may be used as suppositories or enemas. A number of other medications are used or have been studied. Your caregiver will make specific recommendations. HOME CARE INSTRUCTIONS   Symptoms such as diarrhea can be controlled with medications. Avoid foods that have a laxative effect such as fresh fruit, vegetables, and dairy products. During flare-ups, you can rest your bowel by refraining from solid foods. Drink clear liquids frequently during the day. (Electrolyte or rehydrating fluids are best. Your caregiver can help you with suggestions.) Drink often to prevent loss of body fluids (dehydration). When diarrhea has cleared, eat small meals and more frequently. Avoid food additives and stimulants such as caffeine (coffee, tea, or chocolate). Enzyme supplements may help if you develop intolerance to a sugar in dairy products (lactose). Ask your caregiver or dietitian about specific dietary instructions.  Try to maintain a positive attitude. Learn relaxation techniques such as self-hypnosis, mental imaging, and muscle relaxation.  If possible, avoid stresses which can aggravate your condition.  Exercise regularly.  Follow your diet.  Always get plenty of rest. SEEK MEDICAL CARE IF:   Your symptoms fail to improve after a week or two of new treatment.  You experience continued weight loss.  You have ongoing cramps or loose bowels.  You develop a new skin rash, skin sores, or eye problems. SEEK IMMEDIATE MEDICAL CARE IF:   You have worsening of your symptoms or develop new symptoms.  You have a fever.  You develop bloody diarrhea.  You develop severe abdominal pain. MAKE SURE YOU:   Understand these instructions.  Will watch your condition.  Will get help right away if you are not doing  well or get worse. Document Released: 11/17/2004 Document Revised: 06/24/2013 Document Reviewed: 10/16/2006 [facility] Patient Information 2015 [place], [place]. This information is not intended to replace advice given to you by your health care provider. Make sure you discuss any questions you have with your health care provider.

## 2014-11-17 ENCOUNTER — Ambulatory Visit (INDEPENDENT_AMBULATORY_CARE_PROVIDER_SITE_OTHER): Payer: Medicare Other | Admitting: Family Medicine

## 2014-11-17 ENCOUNTER — Encounter: Payer: Self-pay | Admitting: Family Medicine

## 2014-11-17 ENCOUNTER — Emergency Department (HOSPITAL_COMMUNITY)
Admission: EM | Admit: 2014-11-17 | Discharge: 2014-11-17 | Disposition: A | Discharge status: Home / Self Care | Payer: Medicare Other | Attending: Emergency Medicine | Admitting: Emergency Medicine

## 2014-11-17 ENCOUNTER — Encounter (HOSPITAL_COMMUNITY): Payer: Self-pay | Admitting: Family Medicine

## 2014-11-17 VITALS — BP 127/75 | HR 94 | Temp 98.3°F | Resp 16 | Ht 62.0 in | Wt 156.0 lb

## 2014-11-17 DIAGNOSIS — F419 Anxiety disorder, unspecified: Secondary | ICD-10-CM | POA: Diagnosis not present

## 2014-11-17 DIAGNOSIS — M549 Dorsalgia, unspecified: Secondary | ICD-10-CM | POA: Diagnosis not present

## 2014-11-17 DIAGNOSIS — D649 Anemia, unspecified: Secondary | ICD-10-CM | POA: Insufficient documentation

## 2014-11-17 DIAGNOSIS — F17210 Nicotine dependence, cigarettes, uncomplicated: Secondary | ICD-10-CM | POA: Diagnosis not present

## 2014-11-17 DIAGNOSIS — Z7952 Long term (current) use of systemic steroids: Secondary | ICD-10-CM | POA: Insufficient documentation

## 2014-11-17 DIAGNOSIS — Z79899 Other long term (current) drug therapy: Secondary | ICD-10-CM | POA: Insufficient documentation

## 2014-11-17 DIAGNOSIS — R197 Diarrhea, unspecified: Secondary | ICD-10-CM

## 2014-11-17 DIAGNOSIS — K5090 Crohn's disease, unspecified, without complications: Secondary | ICD-10-CM | POA: Insufficient documentation

## 2014-11-17 DIAGNOSIS — K50119 Crohn's disease of large intestine with unspecified complications: Secondary | ICD-10-CM | POA: Diagnosis not present

## 2014-11-17 DIAGNOSIS — G8929 Other chronic pain: Secondary | ICD-10-CM

## 2014-11-17 DIAGNOSIS — F319 Bipolar disorder, unspecified: Secondary | ICD-10-CM | POA: Diagnosis not present

## 2014-11-17 DIAGNOSIS — Z720 Tobacco use: Secondary | ICD-10-CM | POA: Diagnosis not present

## 2014-11-17 DIAGNOSIS — R1032 Left lower quadrant pain: Secondary | ICD-10-CM | POA: Diagnosis not present

## 2014-11-17 DIAGNOSIS — F17200 Nicotine dependence, unspecified, uncomplicated: Secondary | ICD-10-CM | POA: Insufficient documentation

## 2014-11-17 DIAGNOSIS — K50919 Crohn's disease, unspecified, with unspecified complications: Secondary | ICD-10-CM

## 2014-11-17 DIAGNOSIS — K625 Hemorrhage of anus and rectum: Secondary | ICD-10-CM | POA: Diagnosis present

## 2014-11-17 DIAGNOSIS — R110 Nausea: Secondary | ICD-10-CM

## 2014-11-17 DIAGNOSIS — R109 Unspecified abdominal pain: Secondary | ICD-10-CM

## 2014-11-17 LAB — COMPREHENSIVE METABOLIC PANEL
ALT: 27 U/L (ref 17–63)
AST: 26 U/L (ref 15–41)
Albumin: 3.9 g/dL (ref 3.5–5.0)
Alkaline Phosphatase: 69 U/L (ref 38–126)
Anion gap: 12 (ref 5–15)
BILIRUBIN TOTAL: 0.6 mg/dL (ref 0.3–1.2)
BUN: 9 mg/dL (ref 6–20)
CHLORIDE: 112 mmol/L — AB (ref 101–111)
CO2: 20 mmol/L — ABNORMAL LOW (ref 22–32)
CREATININE: 0.97 mg/dL (ref 0.61–1.24)
Calcium: 9.2 mg/dL (ref 8.9–10.3)
Glucose, Bld: 89 mg/dL (ref 65–99)
POTASSIUM: 3.5 mmol/L (ref 3.5–5.1)
Sodium: 144 mmol/L (ref 135–145)
TOTAL PROTEIN: 6.7 g/dL (ref 6.5–8.1)

## 2014-11-17 LAB — CBC
HCT: 36.7 % — ABNORMAL LOW (ref 39.0–52.0)
Hemoglobin: 11.7 g/dL — ABNORMAL LOW (ref 13.0–17.0)
MCH: 25.1 pg — ABNORMAL LOW (ref 26.0–34.0)
MCHC: 31.9 g/dL (ref 30.0–36.0)
MCV: 78.8 fL (ref 78.0–100.0)
PLATELETS: 313 10*3/uL (ref 150–400)
RBC: 4.66 MIL/uL (ref 4.22–5.81)
RDW: 17.7 % — AB (ref 11.5–15.5)
WBC: 10.3 10*3/uL (ref 4.0–10.5)

## 2014-11-17 MED ORDER — ACETAMINOPHEN-CODEINE #3 300-30 MG PO TABS: 1.0000 | ORAL_TABLET | Freq: Four times a day (QID) | ORAL | Status: DC | PRN

## 2014-11-17 MED ORDER — ONDANSETRON HCL 4 MG PO TABS
8.0000 mg | ORAL_TABLET | Freq: Once | ORAL | Status: AC
  Administered 2014-11-17: 8 mg via ORAL
  Filled 2014-11-17: qty 2

## 2014-11-17 MED ORDER — HYDROMORPHONE HCL 1 MG/ML IJ SOLN
1.0000 mg | Freq: Once | INTRAMUSCULAR | Status: AC
  Administered 2014-11-17: 1 mg via INTRAMUSCULAR
  Filled 2014-11-17: qty 1

## 2014-11-17 NOTE — Progress Notes (Signed)
Subjective:    Patient ID: Andre [name], male    DOB: 08-27-1985, 29 y.o.   MRN: [id number] .  HPI [name], a 29 year old patient with a history of Crohn's colitis, bipolar affective disorder, and chronic back pain presents to establish care. He states that he recently located to [place] from [place]. He was followed by Dr. Ralph [name], Andre [name] and Andre [name] in [place], [place].    Patient has a history a history of Crohn's colitis. He states that he has been having 12 stools per day and increased pain to left lower quadrant. Patient describes pain as constant, cramping, and sharp. Pain intensity is currently 6/10. He states that he is currently out of Vicodin last prescribed in the emergency department on 11/15/2014. Patient has been frequently evaluated in the emergency department frequently for increased abdominal pain. He was also hospitalized from 11/05/2014-11/09/2014 for increased abdominal pain related to Crohn's  Patient reports [name] in stool. He denies fever,  fatigue, nausea, and vomiting at present.  Past Medical History  Diagnosis Date  . Anxiety   . Bipolar depression   . Crohn disease 1997  . Anemia     Around 2013 required PRBC transfusions. Has received parenteral iron in past. Has taken oral iron in past.   Social History   Social History  . Marital Status: Single    Spouse Name: N/A  . Number of Children: N/A  . Years of Education: N/A   Occupational History  . Not on file.   Social History Main Topics  . Smoking status: Current Every Day Smoker -- 0.50 packs/day for 5 years    Last Attempt to Quit: 11/04/2014  . Smokeless tobacco: Never Used  . Alcohol Use: No  . Drug Use: No  . Sexual Activity: Not on file   Other Topics Concern  . Not on file   Social History Narrative   There is no immunization history on file for this patient.  Allergies  Allergen Reactions  . Remicade [Infliximab] Anaphylaxis  . Compazine  [Prochlorperazine]     agitation  . Ketorolac     Other reaction(s): GI Distress  . Lithium     Toxicity   . Morphine     Other reaction(s): GI Distress  . Nsaids Other (See Comments)    Gi distress  . Ciprofloxacin Rash  . Metronidazole Rash   Review of Systems  Constitutional: Negative for fever and fatigue.  HENT: Negative.   Eyes: Negative.   Respiratory: Negative for chest tightness and shortness of breath.   Cardiovascular: Negative.  Negative for chest pain, palpitations and leg swelling.  Gastrointestinal: Positive for diarrhea (12 stools per day). Negative for [name] in stool.  Musculoskeletal: Negative.   Skin: Negative.   Neurological: Negative.  Negative for dizziness, light-headedness, numbness and headaches.  Psychiatric/Behavioral: The patient is nervous/anxious (stress).        Bipolar disorder History of anxiety      Objective:   Physical Exam  Constitutional: He is oriented to person, place, and time. He appears well-developed. He appears distressed.  HENT:  Head: Normocephalic.  Right Ear: Hearing, tympanic membrane, external ear and ear canal normal.  Left Ear: Hearing, tympanic membrane, external ear and ear canal normal.  Nose: Nose normal.  Mouth/Throat: Uvula is midline, oropharynx is clear and moist and mucous membranes are normal. Abnormal dentition.  Eyes: Conjunctivae and EOM are normal. Pupils are equal, round, and reactive to light.  Neck: Normal  range of motion. Neck supple.  Cardiovascular: Normal rate, regular rhythm, normal heart sounds and intact distal pulses.   Pulmonary/Chest: Effort normal and breath sounds normal.  Abdominal: Soft. He exhibits no distension. There is tenderness in the left lower quadrant.  Musculoskeletal:       Lumbar back: He exhibits pain. He exhibits normal range of motion and no spasm.  Neurological: He is alert and oriented to person, place, and time.      BP 127/75 mmHg  Pulse 94  Temp(Src) 98.3 F (36.8  C) (Oral)  Resp 16  Ht  (1.575 m)  Wt 156 lb (70.761 kg)  BMI 28.53 kg/m2  Assessment & Plan:   1. Crohn's colitis, unspecified complication Patient states that he has had periodic [name] in stools. Hemoglobin is currently 11.6. He states that he was previously told that he does not absorb Ferrous sulfate, so he has not been taking iron supplement. Patient states that he continues to have frequent stools and increased pain to left lower quadrant. Reviewed previous labs and images show no acute focal lesions to explain acute pain. Also, abdominal ultrasound from 11/15/2014 does not show evidence of obstruction. Will send referral to gastroenterologist for further review. Recommend that he continue clear liquid diet.  - Ambulatory referral to Gastroenterology  2. Chronic back pain greater than 3 months duration Patient states that he has been taking Vicodin for chronic back pain. Informed [name] that he will have to be referred to pain clinic to continue chronic pain regimen. Will prescribe Tylenol #3, 15 tabs today. Reviewed [place] Substance Reporting system prior to reorder. He will need to be managed by a pain medication clinic.   - Ambulatory referral to Pain Clinic - acetaminophen-codeine (TYLENOL #3) 300-30 MG per tablet; Take 1 tablet by mouth every 6 (six) hours as needed for moderate pain.  Dispense: 15 tablet; Refill: 0  3. Abdominal pain, left lower quadrant - Ambulatory referral to Gastroenterology  4. Anxiety disorder, unspecified anxiety disorder type Patient reports history of anxiety and bipolar disorder. Will review previous office notes as they become available. Will send referral to behavorial health. Patient is requesting Xanax, suggested that he reports to [facility] walk-in clinic available M-F 8-3. Patient reports that he takes gabapentin 800 mg TID, will notify previous provider to discuss medication regimen prior to prescribing. Patient currently denies  suicidal or homicidal intent. He states that his primary trigger for anxiety is stress.   5. Tobacco dependence Smoking cessation instruction/counseling given:  counseled patient on the dangers of tobacco use, advised patient to stop smoking, and reviewed strategies to maximize success    Preventative care:  Patient states that he received Influenza vaccination while hospitalized   RTC: Schedule follow-up after specialty appointment [facility] M, FNP  The patient was given clear instructions to go to ER or return to medical [name] if symptoms do not improve, worsen or new problems develop. The patient verbalized understanding. Will notify patient with laboratory results.

## 2014-11-17 NOTE — ED Notes (Signed)
Pt here for left lower abd pain. sts 12 stools and bleeding. sts bright red. sts was told hgb slightly low.

## 2014-11-17 NOTE — ED Notes (Signed)
Pt stable, ambulatory, states understanding of discharge instructions 

## 2014-11-17 NOTE — Discharge Instructions (Signed)
- Schedule an appointment with [facility] as soon as possible - Continue to follow up with your new PCP    Abdominal Pain Many things can cause abdominal pain. Usually, abdominal pain is not caused by a disease and will improve without treatment. It can often be observed and treated at home. Your health care provider will do a physical exam and possibly order blood tests and X-rays to help determine the seriousness of your pain. However, in many cases, more time must pass before a clear cause of the pain can be found. Before that point, your health care provider may not know if you need more testing or further treatment. HOME CARE INSTRUCTIONS  Monitor your abdominal pain for any changes. The following actions may help to alleviate any discomfort you are experiencing:  Only take over-the-counter or prescription medicines as directed by your health care provider.  Do not take laxatives unless directed to do so by your health care provider.  Try a clear liquid diet (broth, tea, or water) as directed by your health care provider. Slowly move to a bland diet as tolerated. SEEK MEDICAL CARE IF:  You have unexplained abdominal pain.  You have abdominal pain associated with nausea or diarrhea.  You have pain when you urinate or have a bowel movement.  You experience abdominal pain that wakes you in the night.  You have abdominal pain that is worsened or improved by eating food.  You have abdominal pain that is worsened with eating fatty foods.  You have a fever. SEEK IMMEDIATE MEDICAL CARE IF:   Your pain does not go away within 2 hours.  You keep throwing up (vomiting).  Your pain is felt only in portions of the abdomen, such as the right side or the left lower portion of the abdomen.  You pass bloody or black tarry stools. MAKE SURE YOU:  Understand these instructions.   Will watch your condition.   Will get help right away if you are not doing well or get worse.  Document  Released: 11/17/2004 Document Revised: 02/12/2013 Document Reviewed: 10/17/2012 [facility] Patient Information 2015 [place], [place]. This information is not intended to replace advice given to you by your health care provider. Make sure you discuss any questions you have with your health care provider.  Crohn Disease Crohn disease is a long-term (chronic) soreness and redness (inflammation) of the intestines (bowel). It can affect any portion of the digestive tract, from the mouth to the anus. It can also cause problems outside the digestive tract. Crohn disease is closely related to a disease called ulcerative colitis (together, these two diseases are called inflammatory bowel disease).  CAUSES  The cause of Crohn disease is not known. One Nelva Bush is that, in an easily affected person, the immune system is triggered to attack the body's own digestive tissue. Crohn disease runs in families. It seems to be more common in certain geographic areas and amongst certain races. There are no clear-cut dietary causes.  SYMPTOMS  Crohn disease can cause many different symptoms since it can affect many different parts of the body. Symptoms include:  Fatigue.  Weight loss.  Chronic diarrhea, sometime bloody.  Abdominal pain and cramps.  Fever.  Ulcers or canker sores in the mouth or rectum.  Anemia (low red blood cells).  Arthritis, skin problems, and eye problems may occur. Complications of Crohn disease can include:  Series of holes (perforation) of the bowel.  Portions of the intestines sticking to each other (adhesions).  Obstruction of the bowel.  Fistula formation, typically in the rectal area but also in other areas. A fistula is an opening between the bowels and the outside, or between the bowels and another organ.  A painful crack in the mucous membrane of the anus (rectal fissure). DIAGNOSIS  Your caregiver may suspect Crohn disease based on your symptoms and an exam. Blood tests may  confirm that there is a problem. You may be asked to submit a stool specimen for examination. X-rays and CT scans may be necessary. Ultimately, the diagnosis is usually made after a procedure that uses a flexible tube that is inserted via your mouth or your anus. This is done under sedation and is called either an upper endoscopy or colonoscopy. With these tests, the specialist can take tiny tissue samples and remove them from the inside of the bowel (biopsy). Examination of this biopsy tissue under a microscope can reveal Crohn disease as the cause of your symptoms. Due to the many different forms that Crohn disease can take, symptoms may be present for several years before a diagnosis is made. TREATMENT  Medications are often used to decrease inflammation and control the immune system. These include medicines related to aspirin, steroid medications, and newer and stronger medications to slow down the immune system. Some medications may be used as suppositories or enemas. A number of other medications are used or have been studied. Your caregiver will make specific recommendations. HOME CARE INSTRUCTIONS   Symptoms such as diarrhea can be controlled with medications. Avoid foods that have a laxative effect such as fresh fruit, vegetables, and dairy products. During flare-ups, you can rest your bowel by refraining from solid foods. Drink clear liquids frequently during the day. (Electrolyte or rehydrating fluids are best. Your caregiver can help you with suggestions.) Drink often to prevent loss of body fluids (dehydration). When diarrhea has cleared, eat small meals and more frequently. Avoid food additives and stimulants such as caffeine (coffee, tea, or chocolate). Enzyme supplements may help if you develop intolerance to a sugar in dairy products (lactose). Ask your caregiver or dietitian about specific dietary instructions.  Try to maintain a positive attitude. Learn relaxation techniques such as  self-hypnosis, mental imaging, and muscle relaxation.  If possible, avoid stresses which can aggravate your condition.  Exercise regularly.  Follow your diet.  Always get plenty of rest. SEEK MEDICAL CARE IF:   Your symptoms fail to improve after a week or two of new treatment.  You experience continued weight loss.  You have ongoing cramps or loose bowels.  You develop a new skin rash, skin sores, or eye problems. SEEK IMMEDIATE MEDICAL CARE IF:   You have worsening of your symptoms or develop new symptoms.  You have a fever.  You develop bloody diarrhea.  You develop severe abdominal pain. MAKE SURE YOU:   Understand these instructions.  Will watch your condition.  Will get help right away if you are not doing well or get worse. Document Released: 11/17/2004 Document Revised: 06/24/2013 Document Reviewed: 10/16/2006 [facility] Patient Information 2015 [place], [place]. This information is not intended to replace advice given to you by your health care provider. Make sure you discuss any questions you have with your health care provider.  Diarrhea Diarrhea is frequent loose and watery bowel movements. It can cause you to feel weak and dehydrated. Dehydration can cause you to become tired and thirsty, have a dry mouth, and have decreased urination that often is dark yellow. Diarrhea is a  sign of another problem, most often an infection that will not last long. In most cases, diarrhea typically lasts 2-3 days. However, it can last longer if it is a sign of something more serious. It is important to treat your diarrhea as directed by your caregiver to lessen or prevent future episodes of diarrhea. CAUSES  Some common causes include:  Gastrointestinal infections caused by viruses, bacteria, or parasites.  Food poisoning or food allergies.  Certain medicines, such as antibiotics, chemotherapy, and laxatives.  Artificial sweeteners and fructose.  Digestive disorders. HOME  CARE INSTRUCTIONS  Ensure adequate fluid intake (hydration): Have 1 cup (8 oz) of fluid for each diarrhea episode. Avoid fluids that contain simple sugars or sports drinks, fruit juices, whole milk products, and sodas. Your urine should be clear or pale yellow if you are drinking enough fluids. Hydrate with an oral rehydration solution that you can purchase at pharmacies, retail stores, and online. You can prepare an oral rehydration solution at home by mixing the following ingredients together:   - tsp table salt.   tsp baking soda.   tsp salt substitute containing potassium chloride.  1  tablespoons sugar.  1 L (34 oz) of water.  Certain foods and beverages may increase the speed at which food moves through the gastrointestinal (GI) tract. These foods and beverages should be avoided and include:  Caffeinated and alcoholic beverages.  High-fiber foods, such as raw fruits and vegetables, nuts, seeds, and whole grain breads and cereals.  Foods and beverages sweetened with sugar alcohols, such as xylitol, sorbitol, and mannitol.  Some foods may be well tolerated and may help thicken stool including:  Starchy foods, such as rice, toast, pasta, low-sugar cereal, oatmeal, grits, baked potatoes, crackers, and bagels.  Bananas.  Applesauce.  Add probiotic-rich foods to help increase healthy bacteria in the GI tract, such as yogurt and fermented milk products.  Wash your hands well after each diarrhea episode.  Only take over-the-counter or prescription medicines as directed by your caregiver.  Take a warm bath to relieve any burning or pain from frequent diarrhea episodes. SEEK IMMEDIATE MEDICAL CARE IF:   You are unable to keep fluids down.  You have persistent vomiting.  You have blood in your stool, or your stools are black and tarry.  You do not urinate in 6-8 hours, or there is only a small amount of very dark urine.  You have abdominal pain that increases or  localizes.  You have weakness, dizziness, confusion, or light-headedness.  You have a severe headache.  Your diarrhea gets worse or does not get better.  You have a fever or persistent symptoms for more than 2-3 days.  You have a fever and your symptoms suddenly get worse. MAKE SURE YOU:   Understand these instructions.  Will watch your condition.  Will get help right away if you are not doing well or get worse. Document Released: 01/28/2002 Document Revised: 06/24/2013 Document Reviewed: 10/16/2011 [facility] Patient Information 2015 [place], [place]. This information is not intended to replace advice given to you by your health care provider. Make sure you discuss any questions you have with your health care provider.  Food Choices to Help Relieve Diarrhea When you have diarrhea, the foods you eat and your eating habits are very important. Choosing the right foods and drinks can help relieve diarrhea. Also, because diarrhea can last up to 7 days, you need to replace lost fluids and electrolytes (such as sodium, potassium, and chloride) in order to help  prevent dehydration.  WHAT GENERAL GUIDELINES DO I NEED TO FOLLOW?  Slowly drink 1 cup (8 oz) of fluid for each episode of diarrhea. If you are getting enough fluid, your urine will be clear or pale yellow.  Eat starchy foods. Some good choices include white rice, white toast, pasta, low-fiber cereal, baked potatoes (without the skin), saltine crackers, and bagels.  Avoid large servings of any cooked vegetables.  Limit fruit to two servings per day. A serving is  cup or 1 small piece.  Choose foods with less than 2 g of fiber per serving.  Limit fats to less than 8 tsp (38 g) per day.  Avoid fried foods.  Eat foods that have probiotics in them. Probiotics can be found in certain dairy products.  Avoid foods and beverages that may increase the speed at which food moves through the stomach and intestines (gastrointestinal tract).  Things to avoid include:  High-fiber foods, such as dried fruit, raw fruits and vegetables, nuts, seeds, and whole grain foods.  Spicy foods and high-fat foods.  Foods and beverages sweetened with high-fructose corn syrup, honey, or sugar alcohols such as xylitol, sorbitol, and mannitol. WHAT FOODS ARE RECOMMENDED? Grains White rice. White, [place], or pita breads (fresh or toasted), including plain rolls, buns, or bagels. White pasta. Saltine, soda, or graham crackers. Pretzels. Low-fiber cereal. Cooked cereals made with water (such as cornmeal, farina, or cream cereals). Plain muffins. Matzo. Melba toast. Zwieback.  Vegetables Potatoes (without the skin). Strained tomato and vegetable juices. Most well-cooked and canned vegetables without seeds. Tender lettuce. Fruits Cooked or canned applesauce, apricots, cherries, fruit cocktail, grapefruit, peaches, pears, or plums. Fresh bananas, apples without skin, cherries, grapes, cantaloupe, grapefruit, peaches, oranges, or plums.  Meat and Other Protein Products Baked or boiled chicken. Eggs. Tofu. Fish. Seafood. Smooth peanut butter. Ground or well-cooked tender beef, ham, veal, lamb, pork, or poultry.  Dairy Plain yogurt, kefir, and unsweetened liquid yogurt. Lactose-free milk, buttermilk, or soy milk. Plain hard cheese. Beverages Sport drinks. Clear broths. Diluted fruit juices (except prune). Regular, caffeine-free sodas such as ginger ale. Water. Decaffeinated teas. Oral rehydration solutions. Sugar-free beverages not sweetened with sugar alcohols. Other Bouillon, broth, or soups made from recommended foods.  The items listed above may not be a complete list of recommended foods or beverages. Contact your dietitian for more options. WHAT FOODS ARE NOT RECOMMENDED? Grains Whole grain, whole wheat, bran, or rye breads, rolls, pastas, crackers, and cereals. Wild or brown rice. Cereals that contain more than 2 g of fiber per serving. Corn  tortillas or taco shells. Cooked or dry oatmeal. Granola. Popcorn. Vegetables Raw vegetables. Cabbage, broccoli, Brussels sprouts, artichokes, baked beans, beet greens, corn, kale, legumes, peas, sweet potatoes, and yams. Potato skins. Cooked spinach and cabbage. Fruits Dried fruit, including raisins and dates. Raw fruits. Stewed or dried prunes. Fresh apples with skin, apricots, mangoes, pears, raspberries, and strawberries.  Meat and Other Protein Products Chunky peanut butter. Nuts and seeds. Beans and lentils. Tomasa Blase.  Dairy High-fat cheeses. Milk, chocolate milk, and beverages made with milk, such as milk shakes. Cream. Ice cream. Sweets and Desserts Sweet rolls, doughnuts, and sweet breads. Pancakes and waffles. Fats and Oils Butter. Cream sauces. Margarine. Salad oils. Plain salad dressings. Olives. Avocados.  Beverages Caffeinated beverages (such as coffee, tea, soda, or energy drinks). Alcoholic beverages. Fruit juices with pulp. Prune juice. Soft drinks sweetened with high-fructose corn syrup or sugar alcohols. Other Coconut. Hot sauce. Chili powder. Mayonnaise. Gravy. Cream-based or milk-based  soups.  The items listed above may not be a complete list of foods and beverages to avoid. Contact your dietitian for more information. WHAT SHOULD I DO IF I BECOME DEHYDRATED? Diarrhea can sometimes lead to dehydration. Signs of dehydration include dark urine and dry mouth and skin. If you think you are dehydrated, you should rehydrate with an oral rehydration solution. These solutions can be purchased at pharmacies, retail stores, or online.  Drink -1 cup (120-240 mL) of oral rehydration solution each time you have an episode of diarrhea. If drinking this amount makes your diarrhea worse, try drinking smaller amounts more often. For example, drink 1-3 tsp (5-15 mL) every 5-10 minutes.  A general rule for staying hydrated is to drink 1-2 L of fluid per day. Talk to your health care provider  about the specific amount you should be drinking each day. Drink enough fluids to keep your urine clear or pale yellow. Document Released: 04/30/2003 Document Revised: 02/12/2013 Document Reviewed: 12/31/2012 [facility] Patient Information 2015 [place], [place]. This information is not intended to replace advice given to you by your health care provider. Make sure you discuss any questions you have with your health care provider.

## 2014-11-17 NOTE — Patient Instructions (Signed)
Chronic Back Pain  When back pain lasts longer than 3 months, it is called chronic back pain.People with chronic back pain often go through certain periods that are more intense (flare-ups).  CAUSES Chronic back pain can be caused by wear and tear (degeneration) on different structures in your back. These structures include:  The bones of your spine (vertebrae) and the joints surrounding your spinal cord and nerve roots (facets).  The strong, fibrous tissues that connect your vertebrae (ligaments). Degeneration of these structures may result in pressure on your nerves. This can lead to constant pain. HOME CARE INSTRUCTIONS  Avoid bending, heavy lifting, prolonged sitting, and activities which make the problem worse.  Take brief periods of rest throughout the day to reduce your pain. Lying down or standing usually is better than sitting while you are resting.  Take over-the-counter or prescription medicines only as directed by your caregiver. SEEK IMMEDIATE MEDICAL CARE IF:   You have weakness or numbness in one of your legs or feet.  You have trouble controlling your bladder or bowels.  You have nausea, vomiting, abdominal pain, shortness of breath, or fainting. Document Released: 03/17/2004 Document Revised: 05/02/2011 Document Reviewed: 01/22/2011 [facility] Patient Information 2015 [place], [place]. This information is not intended to replace advice given to you by your health care provider. Make sure you discuss any questions you have with your health care provider. Chronic Pain Chronic pain can be defined as pain that is off and on and lasts for 3-6 months or longer. Many things cause chronic pain, which can make it difficult to make a diagnosis. There are many treatment options available for chronic pain. However, finding a treatment that works well for you may require trying various approaches until the right one is found. Many people benefit from a combination of two or more types of  treatment to control their pain. SYMPTOMS  Chronic pain can occur anywhere in the body and can range from mild to very severe. Some types of chronic pain include:  Headache.  Low back pain.  Cancer pain.  Arthritis pain.  Neurogenic pain. This is pain resulting from damage to nerves. People with chronic pain may also have other symptoms such as:  Depression.  Anger.  Insomnia.  Anxiety. DIAGNOSIS  Your health care provider will help diagnose your condition over time. In many cases, the initial focus will be on excluding possible conditions that could be causing the pain. Depending on your symptoms, your health care provider may order tests to diagnose your condition. Some of these tests may include:   Blood tests.   CT scan.   MRI.   X-rays.   Ultrasounds.   Nerve conduction studies.  You may need to see a specialist.  TREATMENT  Finding treatment that works well may take time. You may be referred to a pain specialist. He or she may prescribe medicine or therapies, such as:   Mindful meditation or yoga.  Shots (injections) of numbing or pain-relieving medicines into the spine or area of pain.  Local electrical stimulation.  Acupuncture.   Massage therapy.   Aroma, color, light, or sound therapy.   Biofeedback.   Working with a physical therapist to keep from getting stiff.   Regular, gentle exercise.   Cognitive or behavioral therapy.   Group support.  Sometimes, surgery may be recommended.  HOME CARE INSTRUCTIONS   Take all medicines as directed by your health care provider.   Lessen stress in your life by relaxing and doing things  such as listening to calming music.   Exercise or be active as directed by your health care provider.   Eat a healthy diet and include things such as vegetables, fruits, fish, and lean meats in your diet.   Keep all follow-up appointments with your health care provider.   Attend a support group  with others suffering from chronic pain. SEEK MEDICAL CARE IF:   Your pain gets worse.   You develop a new pain that was not there before.   You cannot tolerate medicines given to you by your health care provider.   You have new symptoms since your last visit with your health care provider.  SEEK IMMEDIATE MEDICAL CARE IF:   You feel weak.   You have decreased sensation or numbness.   You lose control of bowel or bladder function.   Your pain suddenly gets much worse.   You develop shaking.  You develop chills.  You develop confusion.  You develop chest pain.  You develop shortness of breath.  MAKE SURE YOU:  Understand these instructions.  Will watch your condition.  Will get help right away if you are not doing well or get worse. Document Released: 10/30/2001 Document Revised: 10/10/2012 Document Reviewed: 08/03/2012 [facility] Patient Information 2015 [place], [place]. This information is not intended to replace advice given to you by your health care provider. Make sure you discuss any questions you have with your health care provider. Crohn Disease Crohn disease is a long-term (chronic) soreness and redness (inflammation) of the intestines (bowel). It can affect any portion of the digestive tract, from the mouth to the anus. It can also cause problems outside the digestive tract. Crohn disease is closely related to a disease called ulcerative colitis (together, these two diseases are called inflammatory bowel disease).  CAUSES  The cause of Crohn disease is not known. One Andre [name] is that, in an easily affected person, the immune system is triggered to attack the body's own digestive tissue. Crohn disease runs in families. It seems to be more common in certain geographic areas and amongst certain races. There are no clear-cut dietary causes.  SYMPTOMS  Crohn disease can cause many different symptoms since it can affect many different parts of the body. Symptoms  include:  Fatigue.  Weight loss.  Chronic diarrhea, sometime bloody.  Abdominal pain and cramps.  Fever.  Ulcers or canker sores in the mouth or rectum.  Anemia (low red blood cells).  Arthritis, skin problems, and eye problems may occur. Complications of Crohn disease can include:  Series of holes (perforation) of the bowel.  Portions of the intestines sticking to each other (adhesions).  Obstruction of the bowel.  Fistula formation, typically in the rectal area but also in other areas. A fistula is an opening between the bowels and the outside, or between the bowels and another organ.  A painful crack in the mucous membrane of the anus (rectal fissure). DIAGNOSIS  Your caregiver may suspect Crohn disease based on your symptoms and an exam. Blood tests may confirm that there is a problem. You may be asked to submit a stool specimen for examination. X-rays and CT scans may be necessary. Ultimately, the diagnosis is usually made after a procedure that uses a flexible tube that is inserted via your mouth or your anus. This is done under sedation and is called either an upper endoscopy or colonoscopy. With these tests, the specialist can take tiny tissue samples and remove them from the inside of the bowel (  biopsy). Examination of this biopsy tissue under a microscope can reveal Crohn disease as the cause of your symptoms. Due to the many different forms that Crohn disease can take, symptoms may be present for several years before a diagnosis is made. TREATMENT  Medications are often used to decrease inflammation and control the immune system. These include medicines related to aspirin, steroid medications, and newer and stronger medications to slow down the immune system. Some medications may be used as suppositories or enemas. A number of other medications are used or have been studied. Your caregiver will make specific recommendations. HOME CARE INSTRUCTIONS   Symptoms such as  diarrhea can be controlled with medications. Avoid foods that have a laxative effect such as fresh fruit, vegetables, and dairy products. During flare-ups, you can rest your bowel by refraining from solid foods. Drink clear liquids frequently during the day. (Electrolyte or rehydrating fluids are best. Your caregiver can help you with suggestions.) Drink often to prevent loss of body fluids (dehydration). When diarrhea has cleared, eat small meals and more frequently. Avoid food additives and stimulants such as caffeine (coffee, tea, or chocolate). Enzyme supplements may help if you develop intolerance to a sugar in dairy products (lactose). Ask your caregiver or dietitian about specific dietary instructions.  Try to maintain a positive attitude. Learn relaxation techniques such as self-hypnosis, mental imaging, and muscle relaxation.  If possible, avoid stresses which can aggravate your condition.  Exercise regularly.  Follow your diet.  Always get plenty of rest. SEEK MEDICAL CARE IF:   Your symptoms fail to improve after a week or two of new treatment.  You experience continued weight loss.  You have ongoing cramps or loose bowels.  You develop a new skin rash, skin sores, or eye problems. SEEK IMMEDIATE MEDICAL CARE IF:   You have worsening of your symptoms or develop new symptoms.  You have a fever.  You develop bloody diarrhea.  You develop severe abdominal pain. MAKE SURE YOU:   Understand these instructions.  Will watch your condition.  Will get help right away if you are not doing well or get worse. Document Released: 11/17/2004 Document Revised: 06/24/2013 Document Reviewed: 10/16/2006 [facility] Patient Information 2015 [place], [place]. This information is not intended to replace advice given to you by your health care provider. Make sure you discuss any questions you have with your health care provider.

## 2014-11-17 NOTE — ED Provider Notes (Signed)
CSN: [id number]     Arrival date & time 11/17/14  1825 History   First MD Initiated Contact with Patient 11/17/14 2030     Chief Complaint  Patient presents with  . Rectal Bleeding  . Abdominal Pain   HPI  Andre [name] is a 29 year old male with PMHx of Crohn's disease presenting from his PCP. He states that he had an appointment this morning at the [facility] clinic for evaluation of poorly controlled Crohn's disease. He says that his PCP instructed him to come to the ED because his hemoglobin was too low and needed to be evaluated. Pt has had 3 ED visits in the past week and one hospitalization 2 weeks ago for abdominal pain and diarrhea associated with his Crohn's disease. He states that he is still experiencing LUQ and LLQ pain and bloody diarrhea with upwards of 15 episodes per day. Pt also complaining of nausea without vomiting. He states that he just received a GI referral today and is attempting to get an appointment scheduled with [facility] ASAP. Pt denies new features of his Crohn's flare from his visit yesterday and the day before. GI evaluated pt during his last hospitalization and believe the pain to be from Crohn's disease, narcotic bowel and possibly from neuropathic pain at previous abdominal surgical sites. Pt denies fevers, chills, headache, chest pain, SOB, palpitations, lightheadedness, dizziness or syncope.  Past Medical History  Diagnosis Date  . Anxiety   . Bipolar depression   . Crohn disease 1997  . Anemia     Around 2013 required PRBC transfusions. Has received parenteral iron in past. Has taken oral iron in past.   Past Surgical History  Procedure Laterality Date  . Subtotal colectomy      Has undergone a total of 3 separate bowel resections including terminal ileal resection and the equivalent of subtotal colectomy. Last surgery was in 2010.  Marland Kitchen Flexible sigmoidoscopy N/A 10/29/2014    Procedure: FLEXIBLE SIGMOIDOSCOPY;  Surgeon: Ruffin Frederick,  MD;  Location: [facility] ENDOSCOPY;  Service: Gastroenterology;  Laterality: N/A;   Family History  Problem Relation Age of Onset  . Hypertension Father    Social History  Substance Use Topics  . Smoking status: Current Every Day Smoker -- 0.50 packs/day for 5 years    Last Attempt to Quit: 11/04/2014  . Smokeless tobacco: Never Used  . Alcohol Use: No    Review of Systems  Constitutional: Negative for fever, chills and diaphoresis.  Respiratory: Negative for shortness of breath.   Cardiovascular: Negative for chest pain and palpitations.  Gastrointestinal: Positive for nausea, abdominal pain and diarrhea. Negative for vomiting and abdominal distention.  Neurological: Negative for dizziness, syncope, light-headedness and headaches.  All other systems reviewed and are negative.     Allergies  Lithium; Remicade; Compazine; Ketorolac; Morphine; Nsaids; Ciprofloxacin; and Metronidazole  Home Medications   Prior to Admission medications   Medication Sig Start Date End Date Taking? Authorizing Provider  acetaminophen-codeine (TYLENOL #3) 300-30 MG per tablet Take 1 tablet by mouth every 6 (six) hours as needed for moderate pain. 11/17/14  Yes Massie Maroon, FNP  ALPRAZolam Prudy Feeler) 0.5 MG tablet Take 0.5 mg by mouth 2 (two) times daily.   Yes Historical Provider, MD  amitriptyline (ELAVIL) 50 MG tablet Take 1 tablet (50 mg total) by mouth at bedtime. 11/09/14  Yes Ripudeep Jenna Luo, MD  azaTHIOprine (IMURAN) 50 MG tablet Take 175 mg by mouth daily.   Yes Historical Provider,  MD  cholestyramine light (PREVALITE) 4 G packet Take 1 packet (4 g total) by mouth 2 (two) times daily. 11/09/14  Yes Ripudeep Jenna Luo, MD  dicyclomine (BENTYL) 20 MG tablet Take 1 tablet (20 mg total) by mouth 3 (three) times daily as needed for spasms. 11/09/14  Yes Ripudeep Jenna Luo, MD  Ferrous Sulfate (IRON) 325 (65 FE) MG TABS Take 325 mg by mouth daily.   Yes Historical Provider, MD  gabapentin (NEURONTIN) 800 MG tablet  Take 800 mg by mouth 3 (three) times daily.   Yes Historical Provider, MD  HYDROcodone-acetaminophen (NORCO/VICODIN) 5-325 MG per tablet Take 2 tablets by mouth every 4 (four) hours as needed. Patient taking differently: Take 2 tablets by mouth every 4 (four) hours as needed for moderate pain.  11/15/14  Yes Hanna Patel-Mills, PA-C  lidocaine (LIDODERM) 5 % Place 1 patch onto the skin daily. Remove & Discard patch within 12 hours or as directed by MD 11/09/14  Yes Ripudeep Jenna Luo, MD  loperamide (IMODIUM) 2 MG capsule Take 2 capsules (4 mg total) by mouth as needed for diarrhea or loose stools (over the counter. Take if diarrhea uncontrolled.). 11/09/14  Yes Ripudeep Jenna Luo, MD  Magnesium 300 MG CAPS Take 300 mg by mouth daily.   Yes Historical Provider, MD  ondansetron (ZOFRAN ODT) 4 MG disintegrating tablet Take 1 tablet (4 mg total) by mouth every 8 (eight) hours as needed for nausea or vomiting. 11/02/14  Yes Renae Fickle, MD  predniSONE (DELTASONE) 10 MG tablet Take prednisone 40 mg (4 tabs) for a week, then taper by 5 mg weekly until off. 11/09/14  Yes Ripudeep Jenna Luo, MD  promethazine (PHENERGAN) 25 MG suppository Place 1 suppository (25 mg total) rectally every 6 (six) hours as needed for nausea or vomiting. 11/11/14  Yes Gwyneth Sprout, MD  hydrocortisone (ANUSOL-HC) 25 MG suppository Place 1 suppository (25 mg total) rectally 2 (two) times daily as needed (rectal pain). Patient not taking: Reported on 11/17/2014 11/02/14   Renae Fickle, MD  promethazine (PHENERGAN) 25 MG tablet Take 1 tablet (25 mg total) by mouth every 6 (six) hours as needed for nausea or vomiting. Patient not taking: Reported on 11/17/2014 11/11/14   Gwyneth Sprout, MD   BP 112/70 mmHg  Pulse 91  Temp(Src) 99 F (37.2 C) (Oral)  Resp 16  SpO2 99% Physical Exam  Constitutional: He appears well-developed and well-nourished. No distress.  HENT:  Head: Normocephalic and atraumatic.  Eyes: Conjunctivae are normal. Right  eye exhibits no discharge. Left eye exhibits no discharge. No scleral icterus.  Neck: Normal range of motion.  Cardiovascular: Normal rate and regular rhythm.   Pulmonary/Chest: Effort normal and breath sounds normal. No respiratory distress. He has no wheezes. He has no rales.  Abdominal: Soft. Bowel sounds are normal. He exhibits no distension. There is no tenderness. There is no rebound.  Midline surgical scar. TTP in LUQ and LLQ. No rebound or guarding.   Musculoskeletal: Normal range of motion.  Neurological: He is alert. Coordination normal.  Skin: Skin is warm and dry.  Psychiatric: He has a normal mood and affect. His behavior is normal.  Nursing note and vitals reviewed.   ED Course  Procedures (including critical care time) Labs Review Labs Reviewed  COMPREHENSIVE METABOLIC PANEL - Abnormal; Notable for the following:    Chloride 112 (*)    CO2 20 (*)    All other components within normal limits  CBC - Abnormal; Notable for the following:  Hemoglobin 11.7 (*)    HCT 36.7 (*)    MCH 25.1 (*)    RDW 17.7 (*)    All other components within normal limits  POC OCCULT BLOOD, ED    Imaging Review No results found. I have personally reviewed and evaluated these images and lab results as part of my medical decision-making.   EKG Interpretation None      MDM   Final diagnoses:  Abdominal pain, unspecified abdominal location  Nausea  Diarrhea  Crohn's disease, unspecified complication   Pt presenting with abdominal pain and diarrhea. Chart review shows that abdominal pain and diarrhea are uncontrolled and he is seen in the ED and hospitalized frequently for these complaints. No new features of today's visit. Pt states that his PCP advised him to come to ED due to concern over his Hgb but her chart does not indicate this concern or instruction. Hgb today is 11.7. Remaining labwork unremarkable. Pt is nontoxic and comfortable appearing. LUQ and LLQ tenderness without  guarding. Long discussion with pt about need to follow up his chronic abdominal pain concerns with GI and PCP. Advised pt that we will not be prescribing him narcotics at this visit. Pt states that he is working on getting into GI and pain clinic. Return precautions given in discharge paperwork and discussed with pt at bedside. Pt stable for discharge     Alveta Heimlich, PA-C 11/18/14 0121  Donnetta Hutching, MD 11/18/14 [phone]

## 2014-12-04 ENCOUNTER — Encounter: Payer: Self-pay | Admitting: Gastroenterology

## 2015-02-05 ENCOUNTER — Ambulatory Visit: Payer: Medicare Other | Admitting: Gastroenterology

## 2015-05-25 ENCOUNTER — Emergency Department (HOSPITAL_BASED_OUTPATIENT_CLINIC_OR_DEPARTMENT_OTHER): Payer: Medicare Other

## 2015-05-25 ENCOUNTER — Emergency Department (HOSPITAL_BASED_OUTPATIENT_CLINIC_OR_DEPARTMENT_OTHER)
Admission: EM | Admit: 2015-05-25 | Discharge: 2015-05-26 | Disposition: A | Discharge status: Home / Self Care | Payer: Medicare Other | Source: Home / Self Care | Attending: Emergency Medicine | Admitting: Emergency Medicine

## 2015-05-25 ENCOUNTER — Encounter (HOSPITAL_BASED_OUTPATIENT_CLINIC_OR_DEPARTMENT_OTHER): Payer: Self-pay | Admitting: Emergency Medicine

## 2015-05-25 DIAGNOSIS — G8929 Other chronic pain: Principal | ICD-10-CM | POA: Diagnosis present

## 2015-05-25 DIAGNOSIS — K591 Functional diarrhea: Secondary | ICD-10-CM | POA: Diagnosis present

## 2015-05-25 DIAGNOSIS — Z7952 Long term (current) use of systemic steroids: Secondary | ICD-10-CM | POA: Diagnosis not present

## 2015-05-25 DIAGNOSIS — D509 Iron deficiency anemia, unspecified: Secondary | ICD-10-CM | POA: Diagnosis present

## 2015-05-25 DIAGNOSIS — F319 Bipolar disorder, unspecified: Secondary | ICD-10-CM | POA: Diagnosis present

## 2015-05-25 DIAGNOSIS — Z79899 Other long term (current) drug therapy: Secondary | ICD-10-CM

## 2015-05-25 DIAGNOSIS — Z9049 Acquired absence of other specified parts of digestive tract: Secondary | ICD-10-CM

## 2015-05-25 DIAGNOSIS — K50811 Crohn's disease of both small and large intestine with rectal bleeding: Secondary | ICD-10-CM | POA: Diagnosis present

## 2015-05-25 DIAGNOSIS — F17210 Nicotine dependence, cigarettes, uncomplicated: Secondary | ICD-10-CM | POA: Diagnosis present

## 2015-05-25 DIAGNOSIS — D649 Anemia, unspecified: Secondary | ICD-10-CM | POA: Insufficient documentation

## 2015-05-25 DIAGNOSIS — F419 Anxiety disorder, unspecified: Secondary | ICD-10-CM

## 2015-05-25 DIAGNOSIS — R1032 Left lower quadrant pain: Secondary | ICD-10-CM | POA: Diagnosis present

## 2015-05-25 DIAGNOSIS — E8809 Other disorders of plasma-protein metabolism, not elsewhere classified: Secondary | ICD-10-CM | POA: Diagnosis present

## 2015-05-25 DIAGNOSIS — E876 Hypokalemia: Secondary | ICD-10-CM | POA: Diagnosis present

## 2015-05-25 DIAGNOSIS — F3130 Bipolar disorder, current episode depressed, mild or moderate severity, unspecified: Secondary | ICD-10-CM | POA: Insufficient documentation

## 2015-05-25 DIAGNOSIS — K50919 Crohn's disease, unspecified, with unspecified complications: Secondary | ICD-10-CM

## 2015-05-25 DIAGNOSIS — F17200 Nicotine dependence, unspecified, uncomplicated: Secondary | ICD-10-CM

## 2015-05-25 DIAGNOSIS — Z8249 Family history of ischemic heart disease and other diseases of the circulatory system: Secondary | ICD-10-CM | POA: Diagnosis not present

## 2015-05-25 LAB — URINALYSIS, ROUTINE W REFLEX MICROSCOPIC
Bilirubin Urine: NEGATIVE
Glucose, UA: NEGATIVE mg/dL
Hgb urine dipstick: NEGATIVE
Ketones, ur: NEGATIVE mg/dL
Leukocytes, UA: NEGATIVE
Nitrite: NEGATIVE
Protein, ur: NEGATIVE mg/dL
Specific Gravity, Urine: 1.014 (ref 1.005–1.030)
pH: 6 (ref 5.0–8.0)

## 2015-05-25 MED ORDER — HYDROMORPHONE HCL 1 MG/ML IJ SOLN
1.0000 mg | Freq: Once | INTRAMUSCULAR | Status: AC
  Administered 2015-05-25: 1 mg via INTRAMUSCULAR

## 2015-05-25 MED ORDER — HYDROMORPHONE HCL 1 MG/ML IJ SOLN
1.0000 mg | Freq: Once | INTRAMUSCULAR | Status: DC
  Filled 2015-05-25: qty 1

## 2015-05-25 MED ORDER — ONDANSETRON 8 MG PO TBDP
8.0000 mg | ORAL_TABLET | Freq: Once | ORAL | Status: AC
  Administered 2015-05-25: 8 mg via ORAL
  Filled 2015-05-25: qty 1

## 2015-05-25 MED ORDER — ONDANSETRON HCL 4 MG/2ML IJ SOLN
4.0000 mg | Freq: Once | INTRAMUSCULAR | Status: DC
  Filled 2015-05-25: qty 2

## 2015-05-25 MED ORDER — HYDROCODONE-ACETAMINOPHEN 5-325 MG PO TABS: 2.0000 | ORAL_TABLET | ORAL | Status: DC | PRN

## 2015-05-25 MED ORDER — SODIUM CHLORIDE 0.9 % IV BOLUS (SEPSIS): 1000.0000 mL | Freq: Once | INTRAVENOUS | Status: DC

## 2015-05-25 MED ORDER — PREDNISONE 10 MG PO TABS: ORAL_TABLET | ORAL | Status: DC

## 2015-05-25 NOTE — ED Notes (Signed)
Patient states that he is having pain to his lower left abdominal area x 3 days. The patient reports that he is having nausea.

## 2015-05-25 NOTE — ED Provider Notes (Signed)
CSN: [id number]     Arrival date & time 05/25/15  1804 History   First MD Initiated Contact with Patient 05/25/15 1950     Chief Complaint  Patient presents with  . Abdominal Pain     (Consider location/radiation/quality/duration/timing/severity/associated sxs/prior Treatment) HPI  Andre [name] is a 30 year old male with a past history of Crohn's disease, bipolar depression who presents the ED complaining of left lower quadrant abdominal pain worsening over the last 3 days. Patient states that this feels like his typical Crohn's flare. He has been taking prednisone and Imuran, last dose today. Patient reports associated diarrhea, greater than 10 episodes a day but states that this is his baseline. He does not have any pain with bowel movements. He denies any melena or hematochezia, fevers or chills. Denies dysuria, hematuria, back pain. Patient states that he is new to the area and does not have a GI provider locally. He was last seen by his GI doctor in [place] 2 months ago. Past surgical history is significant for 3 partial colectomies, last surgery was performed in August 2015.  Past Medical History  Diagnosis Date  . Anxiety   . Bipolar depression (HCC)   . Crohn disease (HCC) 1997  . Anemia     Around 2013 required PRBC transfusions. Has received parenteral iron in past. Has taken oral iron in past.   Past Surgical History  Procedure Laterality Date  . Subtotal colectomy      Has undergone a total of 3 separate bowel resections including terminal ileal resection and the equivalent of subtotal colectomy. Last surgery was in 2010.  Marland Kitchen Flexible sigmoidoscopy N/A 10/29/2014    Procedure: FLEXIBLE SIGMOIDOSCOPY;  Surgeon: Ruffin Frederick, MD;  Location: [facility] ENDOSCOPY;  Service: Gastroenterology;  Laterality: N/A;   Family History  Problem Relation Age of Onset  . Hypertension Father    Social History  Substance Use Topics  . Smoking status: Current Every Day Smoker -- 0.50  packs/day for 5 years    Last Attempt to Quit: 11/04/2014  . Smokeless tobacco: Never Used  . Alcohol Use: No    Review of Systems  All other systems reviewed and are negative.     Allergies  Lithium; Remicade; Compazine; Ketorolac; Morphine; Nsaids; Ciprofloxacin; Haldol; and Metronidazole  Home Medications   Prior to Admission medications   Medication Sig Start Date End Date Taking? Authorizing Provider  acetaminophen-codeine (TYLENOL #3) 300-30 MG per tablet Take 1 tablet by mouth every 6 (six) hours as needed for moderate pain. 11/17/14   Massie Maroon, FNP  ALPRAZolam Prudy Feeler) 0.5 MG tablet Take 0.5 mg by mouth 2 (two) times daily.    Historical Provider, MD  amitriptyline (ELAVIL) 50 MG tablet Take 1 tablet (50 mg total) by mouth at bedtime. 11/09/14   Ripudeep Jenna Luo, MD  azaTHIOprine (IMURAN) 50 MG tablet Take 175 mg by mouth daily.    Historical Provider, MD  cholestyramine light (PREVALITE) 4 G packet Take 1 packet (4 g total) by mouth 2 (two) times daily. 11/09/14   Ripudeep Jenna Luo, MD  dicyclomine (BENTYL) 20 MG tablet Take 1 tablet (20 mg total) by mouth 3 (three) times daily as needed for spasms. 11/09/14   Ripudeep Jenna Luo, MD  Ferrous Sulfate (IRON) 325 (65 FE) MG TABS Take 325 mg by mouth daily.    Historical Provider, MD  gabapentin (NEURONTIN) 800 MG tablet Take 800 mg by mouth 3 (three) times daily.    Historical Provider, MD  HYDROcodone-acetaminophen (  NORCO/VICODIN) 5-325 MG per tablet Take 2 tablets by mouth every 4 (four) hours as needed. Patient taking differently: Take 2 tablets by mouth every 4 (four) hours as needed for moderate pain.  11/15/14   Hanna Patel-Mills, PA-C  hydrocortisone (ANUSOL-HC) 25 MG suppository Place 1 suppository (25 mg total) rectally 2 (two) times daily as needed (rectal pain). Patient not taking: Reported on 11/17/2014 11/02/14   Renae Fickle, MD  lidocaine (LIDODERM) 5 % Place 1 patch onto the skin daily. Remove & Discard patch within 12  hours or as directed by MD 11/09/14   Ripudeep Jenna Luo, MD  loperamide (IMODIUM) 2 MG capsule Take 2 capsules (4 mg total) by mouth as needed for diarrhea or loose stools (over the counter. Take if diarrhea uncontrolled.). 11/09/14   Ripudeep Jenna Luo, MD  Magnesium 300 MG CAPS Take 300 mg by mouth daily.    Historical Provider, MD  ondansetron (ZOFRAN ODT) 4 MG disintegrating tablet Take 1 tablet (4 mg total) by mouth every 8 (eight) hours as needed for nausea or vomiting. 11/02/14   Renae Fickle, MD  predniSONE (DELTASONE) 10 MG tablet Take prednisone 40 mg (4 tabs) for a week, then taper by 5 mg weekly until off. 11/09/14   Ripudeep Jenna Luo, MD  promethazine (PHENERGAN) 25 MG suppository Place 1 suppository (25 mg total) rectally every 6 (six) hours as needed for nausea or vomiting. 11/11/14   Gwyneth Sprout, MD  promethazine (PHENERGAN) 25 MG tablet Take 1 tablet (25 mg total) by mouth every 6 (six) hours as needed for nausea or vomiting. Patient not taking: Reported on 11/17/2014 11/11/14   Gwyneth Sprout, MD   BP 127/87 mmHg  Pulse 68  Temp(Src) 99.6 F (37.6 C) (Oral)  Resp 16  Ht 5\' 2"  (1.575 m)  Wt 113.399 kg  BMI 45.71 kg/m2  SpO2 100% Physical Exam  Constitutional: He is oriented to person, place, and time. He appears well-developed and well-nourished. No distress.  HENT:  Head: Normocephalic and atraumatic.  Mouth/Throat: No oropharyngeal exudate.  Eyes: Conjunctivae and EOM are normal. Pupils are equal, round, and reactive to light. Right eye exhibits no discharge. Left eye exhibits no discharge. No scleral icterus.  Neck: Neck supple.  Cardiovascular: Normal rate, regular rhythm, normal heart sounds and intact distal pulses.  Exam reveals no gallop and no friction rub.   No murmur heard. Pulmonary/Chest: Effort normal and breath sounds normal. No respiratory distress. He has no wheezes. He has no rales. He exhibits no tenderness.  Abdominal: Soft. Bowel sounds are normal. He  exhibits no distension. There is tenderness ( LLQ TTP). There is no guarding.  Large vertical scar midline abdomen   Musculoskeletal: Normal range of motion. He exhibits no edema.  Lymphadenopathy:    He has no cervical adenopathy.  Neurological: He is alert and oriented to person, place, and time.  Skin: Skin is warm and dry. No rash noted. He is not diaphoretic. No erythema. No pallor.  Psychiatric: He has a normal mood and affect. His behavior is normal.  Nursing note and vitals reviewed.   ED Course  Procedures (including critical care time) Labs Review Labs Reviewed  URINALYSIS, ROUTINE W REFLEX MICROSCOPIC (NOT AT [facility])  COMPREHENSIVE METABOLIC PANEL  CBC WITH DIFFERENTIAL/PLATELET  LIPASE, BLOOD    Imaging Review No results found. I have personally reviewed and evaluated these images and lab results as part of my medical decision-making.   EKG Interpretation None      MDM  Final diagnoses:  Crohn's disease with complication, unspecified gastrointestinal tract location [facility])   30 y.o M with hx of Crohn's presents with progressively worsening left lower quadrant abdominal pain, vomiting and diarrhea. Patient states this feels like his typical Crohn's flare. He states he would like to avoid CT scan if possible and only obtain an x-ray. He has had multiple bowel resections with a large scar on his abdomen. Concern for small bowel obstruction. X-ray obtained which reveals single prominent small bowel loop in the mid right abdomen which is nonspecific. When compared to previous x-rays this is consistent and unchanged. Multiple IV attempts were tried. Patient is now refusing additional attempts. Patient is requesting discharge so that he may follow up with his gastroenterologist. Discussed risks and benefits with patient and concern for small bowel obstruction. Patient is adamant that he does not believe this is an SBO and is requesting pain medication and steroids so that he may  follow up with his gastroenterologist. Dr. Broadus John spoke with pt as well to offer IV through an EJ, pt still refuses. Pain managed in ED. He may follow up tomorrow as an outpatient.   Lester Kinsman [place], PA-C 05/28/15 2354  Arby Barrette, MD 05/29/15 [phone]

## 2015-05-26 ENCOUNTER — Inpatient Hospital Stay (HOSPITAL_BASED_OUTPATIENT_CLINIC_OR_DEPARTMENT_OTHER)
Admission: EM | Admit: 2015-05-26 | Discharge: 2015-05-29 | DRG: 092 | Disposition: A | Discharge status: Home / Self Care | Payer: Medicare Other | Attending: Internal Medicine | Admitting: Internal Medicine

## 2015-05-26 ENCOUNTER — Encounter (HOSPITAL_BASED_OUTPATIENT_CLINIC_OR_DEPARTMENT_OTHER): Payer: Self-pay | Admitting: Emergency Medicine

## 2015-05-26 DIAGNOSIS — G894 Chronic pain syndrome: Secondary | ICD-10-CM

## 2015-05-26 DIAGNOSIS — E876 Hypokalemia: Secondary | ICD-10-CM

## 2015-05-26 DIAGNOSIS — K5080 Crohn's disease of both small and large intestine without complications: Secondary | ICD-10-CM | POA: Insufficient documentation

## 2015-05-26 DIAGNOSIS — K591 Functional diarrhea: Secondary | ICD-10-CM | POA: Insufficient documentation

## 2015-05-26 DIAGNOSIS — K5090 Crohn's disease, unspecified, without complications: Secondary | ICD-10-CM

## 2015-05-26 DIAGNOSIS — K5010 Crohn's disease of large intestine without complications: Secondary | ICD-10-CM | POA: Diagnosis present

## 2015-05-26 DIAGNOSIS — G8929 Other chronic pain: Secondary | ICD-10-CM | POA: Diagnosis not present

## 2015-05-26 DIAGNOSIS — R1032 Left lower quadrant pain: Secondary | ICD-10-CM | POA: Insufficient documentation

## 2015-05-26 LAB — CBC WITH DIFFERENTIAL/PLATELET
Basophils Absolute: 0.1 10*3/uL (ref 0.0–0.1)
Basophils Relative: 1 %
EOS ABS: 0 10*3/uL (ref 0.0–0.7)
Eosinophils Relative: 0 %
HCT: 33.3 % — ABNORMAL LOW (ref 39.0–52.0)
HEMOGLOBIN: 10.9 g/dL — AB (ref 13.0–17.0)
LYMPHS ABS: 1.5 10*3/uL (ref 0.7–4.0)
LYMPHS PCT: 18 %
MCH: 27.3 pg (ref 26.0–34.0)
MCHC: 32.7 g/dL (ref 30.0–36.0)
MCV: 83.3 fL (ref 78.0–100.0)
MONOS PCT: 10 %
Monocytes Absolute: 0.8 10*3/uL (ref 0.1–1.0)
NEUTROS ABS: 5.7 10*3/uL (ref 1.7–7.7)
NEUTROS PCT: 71 %
Platelets: 340 10*3/uL (ref 150–400)
RBC: 4 MIL/uL — AB (ref 4.22–5.81)
RDW: 14.1 % (ref 11.5–15.5)
WBC: 8 10*3/uL (ref 4.0–10.5)

## 2015-05-26 LAB — URINALYSIS, ROUTINE W REFLEX MICROSCOPIC
Bilirubin Urine: NEGATIVE
Glucose, UA: NEGATIVE mg/dL
Hgb urine dipstick: NEGATIVE
Ketones, ur: NEGATIVE mg/dL
LEUKOCYTES UA: NEGATIVE
NITRITE: NEGATIVE
PH: 6.5 (ref 5.0–8.0)
Protein, ur: NEGATIVE mg/dL
SPECIFIC GRAVITY, URINE: 1.025 (ref 1.005–1.030)

## 2015-05-26 LAB — COMPREHENSIVE METABOLIC PANEL
ALBUMIN: 3.4 g/dL — AB (ref 3.5–5.0)
ALT: 13 U/L — ABNORMAL LOW (ref 17–63)
ANION GAP: 7 (ref 5–15)
AST: 13 U/L — AB (ref 15–41)
Alkaline Phosphatase: 62 U/L (ref 38–126)
BUN: 9 mg/dL (ref 6–20)
CO2: 25 mmol/L (ref 22–32)
Calcium: 7.9 mg/dL — ABNORMAL LOW (ref 8.9–10.3)
Chloride: 110 mmol/L (ref 101–111)
Creatinine, Ser: 0.89 mg/dL (ref 0.61–1.24)
GFR calc Af Amer: >60 mL/min (ref >60)
GFR calc non Af Amer: >60 mL/min (ref >60)
GLUCOSE: 93 mg/dL (ref 65–99)
POTASSIUM: 2.9 mmol/L — AB (ref 3.5–5.1)
SODIUM: 142 mmol/L (ref 135–145)
Total Bilirubin: 0.3 mg/dL (ref 0.3–1.2)
Total Protein: 5.8 g/dL — ABNORMAL LOW (ref 6.5–8.1)

## 2015-05-26 LAB — MAGNESIUM: Magnesium: 1.4 mg/dL — ABNORMAL LOW (ref 1.7–2.4)

## 2015-05-26 LAB — LIPASE, BLOOD: Lipase: 23 U/L (ref 11–51)

## 2015-05-26 MED ORDER — POTASSIUM CHLORIDE CRYS ER 20 MEQ PO TBCR
40.0000 meq | EXTENDED_RELEASE_TABLET | Freq: Once | ORAL | Status: AC
  Administered 2015-05-26: 40 meq via ORAL
  Filled 2015-05-26: qty 2

## 2015-05-26 MED ORDER — METHYLPREDNISOLONE SODIUM SUCC 125 MG IJ SOLR
125.0000 mg | Freq: Once | INTRAMUSCULAR | Status: AC
  Administered 2015-05-26: 125 mg via INTRAVENOUS
  Filled 2015-05-26: qty 2

## 2015-05-26 MED ORDER — HYDROMORPHONE HCL 1 MG/ML IJ SOLN
2.0000 mg | INTRAMUSCULAR | Status: AC | PRN
  Administered 2015-05-26 – 2015-05-27 (×3): 2 mg via INTRAVENOUS
  Filled 2015-05-26 (×3): qty 2

## 2015-05-26 MED ORDER — HYDROMORPHONE HCL 1 MG/ML IJ SOLN
0.5000 mg | Freq: Once | INTRAMUSCULAR | Status: AC
  Administered 2015-05-26: 0.5 mg via INTRAVENOUS
  Filled 2015-05-26: qty 1

## 2015-05-26 MED ORDER — SODIUM CHLORIDE 0.9 % IV BOLUS (SEPSIS)
1000.0000 mL | Freq: Once | INTRAVENOUS | Status: AC
  Administered 2015-05-26: 1000 mL via INTRAVENOUS

## 2015-05-26 NOTE — Discharge Instructions (Signed)
Crohn Disease Crohn disease is a long-lasting (chronic) disease that affects your gastrointestinal (GI) tract. It often causes irritation and swelling (inflammation) in your small intestine and the beginning of your large intestine. However, it can affect any part of your GI tract. Crohn disease is part of a group of illnesses that are known as inflammatory bowel disease (IBD). Crohn disease may start slowly and get worse over time. Symptoms may come and go. They may also disappear for months or even years at a time (remission). CAUSES The exact cause of Crohn disease is not known. It may be a response that causes your body's defense system (immune system) to mistakenly attack healthy cells and tissues (autoimmune response). Your genes and your environment may also play a role. RISK FACTORS You may be at greater risk for Crohn disease if you:  Have other family members with Crohn disease or another IBD.  Use any tobacco products, including cigarettes, chewing tobacco, or electronic cigarettes.  Are in your 22s.  Have [place] European ancestry. SIGNS AND SYMPTOMS The main signs and symptoms of Crohn disease involve your GI tract. These include:  Diarrhea.  Rectal bleeding.  An urgent need to move your bowels.  The feeling that you are not finished having a bowel movement.  Abdominal pain or cramping.  Constipation. General signs and symptoms of Crohn disease may also include:  Unexplained weight loss.  Fatigue.  Fever.  Nausea.  Loss of appetite.  Joint pain  Changes in vision.  Red bumps on your skin. DIAGNOSIS Your health care provider may suspect Crohn disease based on your symptoms and your medical history. Your health care provider will do a physical exam. You may need to see a health care provider who specializes in diseases of the digestive tract (gastroenterologist). You may also have tests to help your health care providers make a diagnosis. These may  include:  Blood tests.  Stool sample tests.  Imaging tests, such as X-rays and CT scans.  Tests to examine the inside of your intestines using a long, flexible tube that has a light and a camera on the end (endoscopy or colonoscopy).  A procedure to take tissue samples from inside your bowel (biopsy) to be examined under a microscope. TREATMENT  There is no cure for Crohn disease. Treatment will focus on managing your symptoms. Crohn disease affects each person differently. Your treatment may include:  Resting your bowels. Drinking only clear liquids or getting nutrition through an IV for a period of time gives your bowels a chance to heal because they are not passing stools.  Medicines. These may be used alone or in combination (combination therapy). These may include antibiotic medicines. You may be given medicines that help to:  Reduce inflammation.  Control your immune system activity.  Fight infections.  Relieve cramps and prevent diarrhea.  Control your pain.  Surgery. You may need surgery if:  Medicines and other treatments are no longer working.  You develop complications from severe Crohn disease.  A section of your intestine becomes so damaged that it needs to be removed. HOME CARE INSTRUCTIONS  Take medicines only as directed by your health care provider.  If you were prescribed an antibiotic medicine, finish it all even if you start to feel better.  Keep all follow-up visits as directed by your health care provider. This is important.  Talk with your health care provider about changing your diet. This may help your symptoms. Your health care provide may recommend changes, such  as:  Drinking more fluids.  Avoiding milk and other foods that contain lactose.  Eating a low-fat diet.  Avoiding high-fiber foods, such as popcorn and nuts.  Avoiding carbonated beverages, such as soda.  Eating smaller meals more often rather than eating large  meals.  Keeping a food diary to identify foods that make your symptoms better or worse.  Do not use any tobacco products, including cigarettes, chewing tobacco, or electronic cigarettes. If you need help quitting, ask your health care provider.  Limit alcohol intake to no more than 1 drink per day for nonpregnant women and 2 drinks per day for men. One drink equals 12 ounces of beer, 5 ounces of wine, or 1 ounces of hard liquor.  Exercise daily or as directed by your health care provider. SEEK MEDICAL CARE IF:  You have diarrhea, abdominal cramps, and other gastrointestinal problems that are present almost all of the time.  Your symptoms do not improve with treatment.  You continue to lose weight.  You develop a rash or sores on your skin.  You develop eye problems.  You have a fever.   Your symptoms get worse.  You develop new symptoms. SEEK IMMEDIATE MEDICAL CARE IF:  You have bloody diarrhea.  You develop severe abdominal pain.  You cannot pass stools.   This information is not intended to replace advice given to you by your health care provider. Make sure you discuss any questions you have with your health care provider.   Follow-up with your primary care provider as soon as possible for reevaluation. You will need to establish care with a GI provider in this area. Take pain medication as prescribed. Continue taking home Imuran. Return to the ED if you experience fever, severe increase in abdominal pain, blood in your stool. Ileostomy Surgery An ileostomy surgery is a procedure that redirects a section of the small intestine to an external opening in the abdomen or to a newly created internal pouch. The external opening is called a stoma or ostomy. The opening may include a valve. External pouches are attached to the stoma to collect waste since the waste can no longer travel through the rest of the bowel. Where the stoma is located and what it looks like depends on the  type of ileostomy performed. An external opening with a valve is emptied with a catheter. An ileostomy may be temporary or permanent. The hospital stay after this procedure is typically 3 to 7 days.  LET YOUR CAREGIVER KNOW ABOUT:  Allergies to food or medicine.  Medicines taken, including vitamins, health supplements, herbs, eyedrops, over-the-counter medicines, and creams.  Use of steroids (by mouth or creams).  Previous problems with anesthetics or numbing medicines.  History of bleeding problems or blood clots.  Previous surgery.  Other health problems, including diabetes and kidney problems.  Possibility of pregnancy, if this applies. RISKS AND COMPLICATIONS General surgical complications may include:  Reaction to anesthesia.  Damage to surrounding nerves, tissues, or structures.  Infection.  Blood clot.  Bleeding.  Scarring. Specific risks for ileostomy, while rare, may include:  Difficulty absorbing nutrients or vitamins from foods.  Large volume of drainage from the ileostomy.  Dehydration.  Intestinal blockage.  Skin irritation around the stoma.  Poor wound healing.  Narrowing or collapsing of the stoma.  Hernia.  Needing to repeat the procedure. BEFORE THE PROCEDURE It is important to follow your surgeon's instructions prior to your procedure to avoid complications. Steps before your procedure may include:  A physical exam, blood and stool tests, rectal exam, X-rays, colonoscopy, and other procedures.  A review of the procedure, the anesthesia being used, and what to expect after the procedure. You may meet with an ostomy advisor. You may be asked to:  Stop taking certain medicines for several days or weeks prior to your procedure, such as blood thinners (including aspirin).  Take certain medicines, such as antibiotics or stool softeners.  Avoid eating and drinking after midnight the night before the procedure. This will help you to avoid  complications from the anesthesia.  Quit smoking. Smoking increases the chances of a healing problem after your procedure. PROCEDURE There are several types of ileostomy procedures. You will be given medicine that makes you sleep (general anesthetic).The procedure may be done as open surgery, with a large cut (incision), or as laparoscopic surgery, with several small incisions. The 3 main procedure types are:  Loop ileostomy. The surgeon pulls a piece of small intestine out toward the abdomen, cuts it, which causes it to open into 2 openings. This procedure is often temporary. It is often performed as part of another procedure. Once the areas of the small intestine have rested or healed, the intestine may be reconnected.  End ileostomy. The surgeon pulls the cut end of small intestine out toward the abdomen and sews it to your skin. This can be temporary or permanent. It is often performed as part of another procedure.  Continent ileostomy. The surgeon creates an internal pouch from part of the small intestine. The pouch stays inside your body and connects to your stoma through a valve your surgeon creates. Waste is emptied through a catheter. AFTER THE PROCEDURE  You will be given pain medicine.  You may be able to suck on ice. You may begin drinking clear fluids as early as the next day. You may begin a normal diet as early as 2 days after surgery, or as directed by your surgeon.  Your stoma will be covered with bandages or a pouch.  Initial drainage from the stoma or rectum will be liquid.  The stoma may be dark-colored, swollen, and bruised until it has more time to heal.  Arrange for someone to help you with activities at home while you recover.   This information is not intended to replace advice given to you by your health care provider. Make sure you discuss any questions you have with your health care provider.   Document Released: 09/20/2010 Document Revised: 05/02/2011 Document  Reviewed: 09/20/2010 [employer] Interactive Patient Education [employer]

## 2015-05-26 NOTE — ED Provider Notes (Signed)
CSN: [id number]     Arrival date & time 05/26/15  1937 History   First MD Initiated Contact with Patient 05/26/15 2035     Chief Complaint  Patient presents with  . Abdominal Pain     (Consider location/radiation/quality/duration/timing/severity/associated sxs/prior Treatment) HPI  Blood pressure 131/75, pulse 74, resp. rate 20, SpO2 99 %.  Andre [name] is a 30 y.o. male with past medical history significant for bipolar, Crohn's disease complaining of left lower quadrant pain which he rates it 7 out of 10 with associated diarrhea x4 days. Patient denies fever, chills, nausea, vomiting melena, hematochezia. Patient has had 3x bowel resections, recently moved to the area, has not established gastroenterology care. Patient takes Remicade and was started on prednisone 40 mg which she's had 2 doses of. States that his outpatient Vicodin was not easing his discomfort.   Past Medical History  Diagnosis Date  . Anxiety   . Bipolar depression (HCC)   . Crohn disease (HCC) 1997  . Anemia     Around 2013 required PRBC transfusions. Has received parenteral iron in past. Has taken oral iron in past.   Past Surgical History  Procedure Laterality Date  . Subtotal colectomy      Has undergone a total of 3 separate bowel resections including terminal ileal resection and the equivalent of subtotal colectomy. Last surgery was in 2010.  Marland Kitchen Flexible sigmoidoscopy N/A 10/29/2014    Procedure: FLEXIBLE SIGMOIDOSCOPY;  Surgeon: Ruffin Frederick, MD;  [facility] END[facility]COPY;  Service: Gastroenterology;  Laterality: N/A;   Family History  Problem Relation Age of Onset  . Hypertension Father    Social History  Substance Use Topics  . Smoking status: Current Every Day Smoker -- 0.50 packs/day for 5 years    Last Attempt to Quit: 11/04/2014  . Smokeless tobacco: Never Used  . Alcohol Use: No    Review of Systems  10 systems reviewed and found to be negative, except as noted in the  HPI.   Allergies  Lithium; Remicade; Compazine; Ketorolac; Morphine; Nsaids; Ciprofloxacin; Haldol; and Metronidazole  Home Medications   Prior to Admission medications   Medication Sig Start Date End Date Taking? Authorizing Provider  acetaminophen-codeine (TYLENOL #3) 300-30 MG per tablet Take 1 tablet by mouth every 6 (six) hours as needed for moderate pain. 11/17/14   Massie Maroon, FNP  ALPRAZolam Prudy Feeler) 0.5 MG tablet Take 0.5 mg by mouth 2 (two) times daily.    Historical Provider, MD  amitriptyline (ELAVIL) 50 MG tablet Take 1 tablet (50 mg total) by mouth at bedtime. 11/09/14   Ripudeep Jenna Luo, MD  azaTHIOprine (IMURAN) 50 MG tablet Take 175 mg by mouth daily.    Historical Provider, MD  cholestyramine light (PREVALITE) 4 G packet Take 1 packet (4 g total) by mouth 2 (two) times daily. 11/09/14   Ripudeep Jenna Luo, MD  dicyclomine (BENTYL) 20 MG tablet Take 1 tablet (20 mg total) by mouth 3 (three) times daily as needed for spasms. 11/09/14   Ripudeep Jenna Luo, MD  Ferrous Sulfate (IRON) 325 (65 FE) MG TABS Take 325 mg by mouth daily.    Historical Provider, MD  gabapentin (NEURONTIN) 800 MG tablet Take 800 mg by mouth 3 (three) times daily.    Historical Provider, MD  HYDROcodone-acetaminophen (NORCO/VICODIN) 5-325 MG per tablet Take 2 tablets by mouth every 4 (four) hours as needed. Patient taking differently: Take 2 tablets by mouth every 4 (four) hours as needed for moderate pain.  11/15/14  Hanna Patel-Mills, PA-C  HYDROcodone-acetaminophen (NORCO/VICODIN) 5-325 MG tablet Take 2 tablets by mouth every 4 (four) hours as needed. 05/25/15   Samantha Tripp Dowless, PA-C  hydrocortisone (ANUSOL-HC) 25 MG suppository Place 1 suppository (25 mg total) rectally 2 (two) times daily as needed (rectal pain). Patient not taking: Reported on 11/17/2014 11/02/14   Renae Fickle, MD  lidocaine (LIDODERM) 5 % Place 1 patch onto the skin daily. Remove & Discard patch within 12 hours or as directed by MD  11/09/14   Ripudeep Jenna Luo, MD  loperamide (IMODIUM) 2 MG capsule Take 2 capsules (4 mg total) by mouth as needed for diarrhea or loose stools (over the counter. Take if diarrhea uncontrolled.). 11/09/14   Ripudeep Jenna Luo, MD  Magnesium 300 MG CAPS Take 300 mg by mouth daily.    Historical Provider, MD  ondansetron (ZOFRAN ODT) 4 MG disintegrating tablet Take 1 tablet (4 mg total) by mouth every 8 (eight) hours as needed for nausea or vomiting. 11/02/14   Renae Fickle, MD  predniSONE (DELTASONE) 10 MG tablet Take prednisone 40 mg (4 tabs) for a week, then taper by 5 mg weekly until off. 05/25/15   Samantha Tripp Dowless, PA-C  promethazine (PHENERGAN) 25 MG suppository Place 1 suppository (25 mg total) rectally every 6 (six) hours as needed for nausea or vomiting. 11/11/14   Gwyneth Sprout, MD  promethazine (PHENERGAN) 25 MG tablet Take 1 tablet (25 mg total) by mouth every 6 (six) hours as needed for nausea or vomiting. Patient not taking: Reported on 11/17/2014 11/11/14   Gwyneth Sprout, MD   BP 131/75 mmHg  Pulse 74  Resp 20  SpO2 99% Physical Exam  Constitutional: He is oriented to person, place, and time. He appears well-developed and well-nourished. No distress.  HENT:  Head: Normocephalic.  Eyes: Conjunctivae and EOM are normal.  Cardiovascular: Normal rate and regular rhythm.   Pulmonary/Chest: Effort normal and breath sounds normal. No stridor.  Abdominal: Soft. Bowel sounds are normal. He exhibits no distension and no mass. There is tenderness. There is no rebound and no guarding.  Left lower quadrant pain with no guarding or rebound  Musculoskeletal: Normal range of motion.  Neurological: He is alert and oriented to person, place, and time.  Psychiatric: He has a normal mood and affect.  Nursing note and vitals reviewed.   ED Course  Procedures (including critical care time) Labs Review Labs Reviewed - No data to display  Imaging Review Dg Abd 1 View  05/25/2015  CLINICAL  DATA:  Left lower quadrant pain and nausea. History of Crohn's disease. EXAM: ABDOMEN - 1 VIEW COMPARISON:  11/15/2014 FINDINGS: Single dilated amorphous small bowel loop is present in the mid and right upper abdomen. No other associated dilated small bowel. Findings may reflect regional ileus. Clips are seen from prior surgery. Bony structures are unremarkable. No abnormal calcifications. IMPRESSION: Single prominent small bowel loop in the mid and right abdomen which is nonspecific. Electronically Signed   By: Irish Lack M.D.   On: 05/25/2015 21:02   I have personally reviewed and evaluated these images and lab results as part of my medical decision-making.   EKG Interpretation None      MDM   Final diagnoses:  Exacerbation of Crohn's disease, without complications (HCC)  Hypokalemia    Filed Vitals:   05/26/15 1942 05/26/15 2119 05/26/15 2158 05/26/15 2251  BP: 131/75  113/70 110/78  Pulse: 74  76 72  Temp:  98.1 F (36.7 C) 98.3  F (36.8 C) 98.1 F (36.7 C)  TempSrc:  Oral Oral Oral  Resp: SpO2: 99%  96% 96%    Medications  HYDROmorphone (DILAUDID) injection 2 mg (2 mg Intravenous Given 05/26/15 2253)  magnesium sulfate IVPB 2 g 50 mL (not administered)  sodium chloride 0.9 % bolus 1,000 mL (0 mLs Intravenous Stopped 05/26/15 2251)  HYDROmorphone (DILAUDID) injection 0.5 mg (0.5 mg Intravenous Given 05/26/15 2155)  methylPREDNISolone sodium succinate (SOLU-MEDROL) 125 mg/2 mL injection 125 mg (125 mg Intravenous Given 05/26/15 2151)  potassium chloride SA (K-DUR,KLOR-CON) CR tablet 40 mEq (40 mEq Oral Given 05/26/15 2342)    Andre [name] is 30 y.o. male presenting with Abdominal pain and diarrhea consistent with prior Crohn's exacerbations. Abdominal exam is nonsurgical, mild tenderness palpation left lower quadrant, patient afebrile with stable vital signs. Potassium is 2.9. Patient will be repleted with mag and potassium. Patient has failed outpatient treatment and  is requesting admission.  Case discussed with Dr. Julian Reil who accepts transfer to [facility] however, there are no MedSurg bed so may be held at [facility] [place] for an extended period of time. Patient put in for 1 mg of Dilaudid every 2 hours.   Wynetta Emery, PA-C 05/27/15 0004  Lavera Guise, MD 05/27/15 [phone]

## 2015-05-26 NOTE — ED Notes (Signed)
Patient states that he was here yesterday was abdominal pain and they were unable to draw his blood. The patient reports that he was told to come back if he did not feel any better and so the patient comes back today with the same pain and it is not any better

## 2015-05-26 NOTE — Plan of Care (Signed)
30 yo M with history of Crohn's disease, seen at numerous hospital systems in past.  Here with Crohn's exacerbation.  Accepted to med surg bed at [facility] but no beds, so may be quite some time before he gets here.

## 2015-05-27 ENCOUNTER — Encounter (HOSPITAL_COMMUNITY): Payer: Self-pay | Admitting: Internal Medicine

## 2015-05-27 DIAGNOSIS — E876 Hypokalemia: Secondary | ICD-10-CM

## 2015-05-27 DIAGNOSIS — K50811 Crohn's disease of both small and large intestine with rectal bleeding: Secondary | ICD-10-CM | POA: Diagnosis not present

## 2015-05-27 DIAGNOSIS — K5080 Crohn's disease of both small and large intestine without complications: Secondary | ICD-10-CM

## 2015-05-27 DIAGNOSIS — G8929 Other chronic pain: Secondary | ICD-10-CM | POA: Diagnosis not present

## 2015-05-27 DIAGNOSIS — F17210 Nicotine dependence, cigarettes, uncomplicated: Secondary | ICD-10-CM | POA: Diagnosis not present

## 2015-05-27 DIAGNOSIS — E8809 Other disorders of plasma-protein metabolism, not elsewhere classified: Secondary | ICD-10-CM | POA: Diagnosis not present

## 2015-05-27 DIAGNOSIS — Z7952 Long term (current) use of systemic steroids: Secondary | ICD-10-CM | POA: Diagnosis not present

## 2015-05-27 DIAGNOSIS — R1032 Left lower quadrant pain: Secondary | ICD-10-CM

## 2015-05-27 DIAGNOSIS — F319 Bipolar disorder, unspecified: Secondary | ICD-10-CM | POA: Diagnosis not present

## 2015-05-27 DIAGNOSIS — D509 Iron deficiency anemia, unspecified: Secondary | ICD-10-CM | POA: Diagnosis not present

## 2015-05-27 DIAGNOSIS — K591 Functional diarrhea: Secondary | ICD-10-CM | POA: Insufficient documentation

## 2015-05-27 DIAGNOSIS — F419 Anxiety disorder, unspecified: Secondary | ICD-10-CM | POA: Diagnosis not present

## 2015-05-27 DIAGNOSIS — K5090 Crohn's disease, unspecified, without complications: Secondary | ICD-10-CM

## 2015-05-27 DIAGNOSIS — Z9049 Acquired absence of other specified parts of digestive tract: Secondary | ICD-10-CM | POA: Diagnosis not present

## 2015-05-27 DIAGNOSIS — Z8249 Family history of ischemic heart disease and other diseases of the circulatory system: Secondary | ICD-10-CM | POA: Diagnosis not present

## 2015-05-27 LAB — CBC WITH DIFFERENTIAL/PLATELET
BASOS PCT: 0 %
Basophils Absolute: 0 10*3/uL (ref 0.0–0.1)
EOS PCT: 0 %
Eosinophils Absolute: 0 10*3/uL (ref 0.0–0.7)
HCT: 31.9 % — ABNORMAL LOW (ref 39.0–52.0)
Hemoglobin: 10.5 g/dL — ABNORMAL LOW (ref 13.0–17.0)
LYMPHS ABS: 0.3 10*3/uL — AB (ref 0.7–4.0)
Lymphocytes Relative: 4 %
MCH: 27 pg (ref 26.0–34.0)
MCHC: 32.9 g/dL (ref 30.0–36.0)
MCV: 82 fL (ref 78.0–100.0)
Monocytes Absolute: 0.1 10*3/uL (ref 0.1–1.0)
Monocytes Relative: 1 %
NEUTROS PCT: 95 %
Neutro Abs: 7.4 10*3/uL (ref 1.7–7.7)
PLATELETS: 289 10*3/uL (ref 150–400)
RBC: 3.89 MIL/uL — AB (ref 4.22–5.81)
RDW: 14.4 % (ref 11.5–15.5)
WBC: 7.7 10*3/uL (ref 4.0–10.5)

## 2015-05-27 LAB — C DIFFICILE QUICK SCREEN W PCR REFLEX
C DIFFICLE (CDIFF) ANTIGEN: NEGATIVE
C Diff interpretation: NEGATIVE
C Diff toxin: NEGATIVE

## 2015-05-27 LAB — COMPREHENSIVE METABOLIC PANEL
ALK PHOS: 61 U/L (ref 38–126)
ALT: 14 U/L — AB (ref 17–63)
AST: 15 U/L (ref 15–41)
Albumin: 2.8 g/dL — ABNORMAL LOW (ref 3.5–5.0)
Anion gap: 10 (ref 5–15)
BILIRUBIN TOTAL: 0.5 mg/dL (ref 0.3–1.2)
BUN: 6 mg/dL (ref 6–20)
CALCIUM: 7.4 mg/dL — AB (ref 8.9–10.3)
CHLORIDE: 110 mmol/L (ref 101–111)
CO2: 22 mmol/L (ref 22–32)
CREATININE: 0.83 mg/dL (ref 0.61–1.24)
Glucose, Bld: 136 mg/dL — ABNORMAL HIGH (ref 65–99)
Potassium: 3.9 mmol/L (ref 3.5–5.1)
Sodium: 142 mmol/L (ref 135–145)
TOTAL PROTEIN: 5.3 g/dL — AB (ref 6.5–8.1)

## 2015-05-27 LAB — MAGNESIUM: Magnesium: 1.7 mg/dL (ref 1.7–2.4)

## 2015-05-27 MED ORDER — LIDOCAINE 5 % EX PTCH
1.0000 | MEDICATED_PATCH | CUTANEOUS | Status: DC
  Administered 2015-05-29: 1 via TRANSDERMAL
  Filled 2015-05-27 (×2): qty 1

## 2015-05-27 MED ORDER — FERROUS SULFATE 325 (65 FE) MG PO TABS
325.0000 mg | ORAL_TABLET | Freq: Every day | ORAL | Status: DC
  Administered 2015-05-27 – 2015-05-28 (×2): 325 mg via ORAL
  Filled 2015-05-27 (×2): qty 1

## 2015-05-27 MED ORDER — HYDROMORPHONE HCL 1 MG/ML IJ SOLN
1.0000 mg | INTRAMUSCULAR | Status: DC | PRN
  Administered 2015-05-27 – 2015-05-28 (×3): 1 mg via INTRAVENOUS
  Filled 2015-05-27 (×3): qty 1

## 2015-05-27 MED ORDER — ACETAMINOPHEN 325 MG PO TABS: 650.0000 mg | ORAL_TABLET | Freq: Four times a day (QID) | ORAL | Status: DC | PRN

## 2015-05-27 MED ORDER — ONDANSETRON HCL 4 MG PO TABS: 4.0000 mg | ORAL_TABLET | Freq: Four times a day (QID) | ORAL | Status: DC | PRN

## 2015-05-27 MED ORDER — ONDANSETRON HCL 4 MG/2ML IJ SOLN: 4.0000 mg | Freq: Four times a day (QID) | INTRAMUSCULAR | Status: DC | PRN

## 2015-05-27 MED ORDER — POTASSIUM CHLORIDE IN NACL 20-0.9 MEQ/L-% IV SOLN
INTRAVENOUS | Status: DC
Start: 2015-05-27 — End: 2015-05-28
  Administered 2015-05-27 (×2): via INTRAVENOUS
  Filled 2015-05-27 (×2): qty 1000

## 2015-05-27 MED ORDER — DICYCLOMINE HCL 20 MG PO TABS: 20.0000 mg | ORAL_TABLET | Freq: Three times a day (TID) | ORAL | Status: DC | PRN

## 2015-05-27 MED ORDER — ACETAMINOPHEN 650 MG RE SUPP: 650.0000 mg | Freq: Four times a day (QID) | RECTAL | Status: DC | PRN

## 2015-05-27 MED ORDER — AMITRIPTYLINE HCL 50 MG PO TABS
50.0000 mg | ORAL_TABLET | Freq: Every day | ORAL | Status: DC
  Administered 2015-05-27 – 2015-05-28 (×2): 50 mg via ORAL
  Filled 2015-05-27 (×2): qty 1

## 2015-05-27 MED ORDER — CHOLESTYRAMINE LIGHT 4 G PO PACK
4.0000 g | PACK | ORAL | Status: DC
  Administered 2015-05-28: 4 g via ORAL
  Filled 2015-05-27 (×5): qty 1

## 2015-05-27 MED ORDER — MAGNESIUM OXIDE 400 (241.3 MG) MG PO TABS
400.0000 mg | ORAL_TABLET | Freq: Every day | ORAL | Status: DC
  Administered 2015-05-27 – 2015-05-29 (×3): 400 mg via ORAL
  Filled 2015-05-27 (×3): qty 1

## 2015-05-27 MED ORDER — ONDANSETRON 4 MG PO TBDP: 4.0000 mg | ORAL_TABLET | Freq: Three times a day (TID) | ORAL | Status: DC | PRN

## 2015-05-27 MED ORDER — AMITRIPTYLINE HCL 50 MG PO TABS: 50.0000 mg | ORAL_TABLET | Freq: Every day | ORAL | Status: DC

## 2015-05-27 MED ORDER — MAGNESIUM SULFATE 2 GM/50ML IV SOLN
2.0000 g | Freq: Once | INTRAVENOUS | Status: AC
  Administered 2015-05-27: 2 g via INTRAVENOUS
  Filled 2015-05-27: qty 50

## 2015-05-27 MED ORDER — METHYLPREDNISOLONE SODIUM SUCC 40 MG IJ SOLR
40.0000 mg | Freq: Every day | INTRAMUSCULAR | Status: DC
  Administered 2015-05-27: 40 mg via INTRAVENOUS
  Filled 2015-05-27: qty 1

## 2015-05-27 MED ORDER — OXYCODONE-ACETAMINOPHEN 5-325 MG PO TABS
2.0000 | ORAL_TABLET | ORAL | Status: DC | PRN
  Administered 2015-05-27 – 2015-05-28 (×3): 2 via ORAL
  Filled 2015-05-27 (×3): qty 2

## 2015-05-27 MED ORDER — AZATHIOPRINE 50 MG PO TABS
175.0000 mg | ORAL_TABLET | Freq: Every day | ORAL | Status: DC
  Administered 2015-05-27 – 2015-05-29 (×3): 175 mg via ORAL
  Filled 2015-05-27 (×3): qty 4

## 2015-05-27 MED ORDER — GABAPENTIN 400 MG PO CAPS
800.0000 mg | ORAL_CAPSULE | Freq: Three times a day (TID) | ORAL | Status: DC
  Administered 2015-05-27 – 2015-05-29 (×7): 800 mg via ORAL
  Filled 2015-05-27 (×7): qty 2

## 2015-05-27 MED ORDER — FENTANYL CITRATE (PF) 100 MCG/2ML IJ SOLN
50.0000 ug | INTRAMUSCULAR | Status: DC | PRN
Start: 2015-05-27 — End: 2015-05-27
  Administered 2015-05-27 (×4): 50 ug via INTRAVENOUS
  Filled 2015-05-27 (×4): qty 2

## 2015-05-27 MED ORDER — HYDROMORPHONE HCL 1 MG/ML IJ SOLN
1.0000 mg | INTRAMUSCULAR | Status: DC | PRN
Start: 2015-05-27 — End: 2015-05-27
  Administered 2015-05-27: 1 mg via INTRAVENOUS
  Filled 2015-05-27: qty 1

## 2015-05-27 MED ORDER — ALPRAZOLAM 0.5 MG PO TABS
0.5000 mg | ORAL_TABLET | Freq: Two times a day (BID) | ORAL | Status: DC
  Administered 2015-05-27 – 2015-05-29 (×5): 0.5 mg via ORAL
  Filled 2015-05-27 (×5): qty 1

## 2015-05-27 MED ORDER — ENOXAPARIN SODIUM 40 MG/0.4ML ~~LOC~~ SOLN
40.0000 mg | Freq: Every day | SUBCUTANEOUS | Status: DC
  Filled 2015-05-27 (×3): qty 0.4

## 2015-05-27 NOTE — Progress Notes (Signed)
Patient seen and examined.  He continues to have left lower quadrant pain that waxes and wanes but never goes away, worse with bowel movements and not relieved after.  Stools are more frequent and softer than usual.  Denies nausea, vomiting, and fevers.  TTP in the left lower quadrant without rebound or guarding.  Normal active bowel sounds.  Has midline area with granulation tissue.  C. Diff negative.  I have called [facility] since patient was previously established with them and he has recently moved back to the area to stay permanently per his report.  Appreciate GI assistance.  Plan to manage symptoms and establish outpatient care with GI and with pain clinic.  Start oral pain medication with dilaudid for breakthrough pain.

## 2015-05-27 NOTE — Consult Note (Signed)
[facility] Consult: 12:53 PM 05/27/2015  LOS: 0 days    Referring Provider: Dr Sheran Fava.   Primary Care Physician:  No primary care provider on file. Primary Gastroenterologist:  Althia Forts.  Dr Clabe Seal in [place].     Reason for Consultation:  Crohns with abd pain and diarrhea   HPI: Andre [name] is a 30 y.o. male. Residence in [place] and in [place] over last several years but now setting up to live in [place] for at least one year. PMH long-standing Crohn's disease since age 55. He has undergone a total of 3 separate bowel resections resulting in subtotal colectomy with what sounds like terminal ileal resection: 2010 ileocolectomy, 09/2013 partial colectomy in [place]. He has never had an ostomy. Bipolar disorder/anxiety.   GI consult during 10/2014 admission to [facility] hospital for n/v/d.  At that time patient gave hx of taking on prednisone most of his life. Previous lower and upper endoscopy performed around March 2016:  noted mild inflammation in the region of the anastomosis and some small bowel stricturing.  ~ 08/2014 his prednisone was increased from 20 mg daily to 40 mg daily. due to CT findings showing increased anastomosis inflammation, however the patient wasn't having clinical flare. This in addition to 175 mg Imuran daily. Patient hx of anaphylaxis to Remicade. Both Humira and Cimzia caused bad rash and were not effective. His GI doctor in [place] was in the process of trying to get him approved to start on Entyvio. Had CTs on 9/3 and 11/05/14.  # 1: Post subtotal colectomy and post partial SB resection.  Thickening, hypervascularity, inflammation near ileocolonic anastomosis. Splenomegaly.  Non-specific lung nodules (? Due to aspiration). #2: improved appearance at anastomosis.    Also admitted to  multiple hospitals on multiple occasions in [place] 2012 - 2016.   CT enterography 05/2014: 2 segments of circumferential bowel wall thickening involving the rectum and mid transverse colon, with compensatory dilatation of the sigmoid and descending colon secondary to rectal involvement. 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  QuantiFERON gold was negative, hepatitis panel pending. TPMT level 16.0 04/2014.  CTE 06/23/14- IMPRESSION: 1. Redemonstration of approximately 9 cm segment of small bowel demonstrating active inflammation on the left side as well as active disease involving the rectum.  Course of events in [facility] 10/2014 10/2014 Flex Sig: Dr Havery Moros.  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 I suspect this finding is causing the patient's rectal bleeding. Normal appearing colon otherwise, with healthy appearing surgical anastomosis, and healthy appearing small bowel. Do not suspect CMV. Overall, no findings on flex sig to explain the patient's severe abdominal pain. If Crohns is causing his abdominal pain, suspect more proximal small bowel involvement, not seen on this exam. Pathology of anastomosis: chronic mucosal injury; rectum: mild/chronic/active proctitis c/w  active IBD.  CMV stains negative.  Pt's diarrhea improved on Questran. Dr Havery Moros stated "I don't think it (Crohns is the primary driving etiology of his pain at this time. His imaging shows some mild inflammatory changes improved from previous. His endoscopy last week did not show any significant active disease. His ESR and CRP are normal. CMV testing negative. 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 I have increased his Elavil to 78m q HS and have started cholestyramine which has helped his diarrhea. I asked for a pain management consultation for abdominal wall trigger point injection to see if this helps, however after discussion with anesthesia this is not possible as an inpatient. He will definitively warrant this as an outpatient. We gave him a trial of lidocaine patches which as helped as well."  "Regarding his Crohns, now on oral prednisone 478m Recommend [id number] one week then taper by 5mg53mery week.  Longer term, [facility] the timing of this will be determine by where he chooses to seek his longer term care. I wouldn't dose him now with it if [place], plan to transition to oral narcotics and then off"   [facility].   Events since leaving [place] in fall 2016 Never went back to GI in [place].  [place] and kept him on Prednisone 20 mg, Imuran and did not start Entyvio, pt says because he planned to leave the state and MD did not want to start a med that he could not monitor.  Did have CT but no repeat colon/flex/egd while in [place].   When doing well pt has soft stools and low level pain, eats well.  Flares frequently with watery to pudding like stools and intense LLQ pain.    Current Events 4/3 visit to ED with pudding like to watery stools every 3 hours, increased pain in LLQ.  No n/v.  Prednisone increased to 40 mg and limited Rx for hydrocodone given.  Neither helped so returned to ED and admitted.  K 2.9, corrected to 3.9.  Hgb 10.5, 11.6 in 10/2014. Albumin 2.8.  C diff studies negative.   Pain somewhat  better with Dilaudid and Fentanyl.  Still watery, non-bloody stools.    Past Medical History  Diagnosis Date  . Anxiety   . Bipolar depression (HCC)[place]. Crohn disease (HCC)[id number]  . Anemia     Around 2013 required PRBC transfusions. Has received parenteral iron in past. Has taken oral iron in past.    Past Surgical History  Procedure Laterality Date  . Subtotal colectomy      Has undergone a total of 3 separate bowel resections including terminal ileal resection and the equivalent of subtotal colectomy. Last surgery was in 2010.  . FlMarland Kitchenxible sigmoidoscopy N/A 10/29/2014    Procedure: FLEXIBLE SIGMOIDOSCOPY;  Surgeon: StevManus Gunning;  [facility];  Laterality: N/A;    Prior to Admission medications   Medication Sig Start Date End Date Taking? Authorizing Provider  acetaminophen-codeine (TYLENOL #3) 300-30 MG per tablet Take 1 tablet  by mouth every 6 (six) hours as needed for moderate pain. 11/17/14   Dorena Dew, FNP  ALPRAZolam Duanne Moron) 0.5 MG tablet Take 0.5 mg by mouth 2 (two) times daily.    Historical Provider, MD  amitriptyline (ELAVIL) 50 MG tablet Take 1 tablet (50 mg total) by mouth at bedtime. 11/09/14   Ripudeep Krystal Eaton, MD  azaTHIOprine (IMURAN) 50 MG tablet Take 175 mg by mouth daily.    Historical Provider, MD  cholestyramine light (PREVALITE) 4 G packet Take 1 packet (4 g total) by mouth 2 (two) times daily. 11/09/14   Ripudeep Krystal Eaton, MD  dicyclomine (BENTYL) 20 MG tablet Take 1 tablet (20 mg total) by mouth 3 (three) times daily as needed for spasms. 11/09/14   Ripudeep Krystal Eaton, MD  Ferrous Sulfate (IRON) 325 (65 FE) MG TABS Take 325 mg by mouth daily.    Historical Provider, MD  gabapentin (NEURONTIN) 800 MG tablet Take 800 mg by mouth 3 (three) times daily.    Historical Provider, MD  HYDROcodone-acetaminophen (NORCO/VICODIN) 5-325 MG per tablet Take 2 tablets by mouth every 4 (four) hours as needed. Patient taking  differently: Take 2 tablets by mouth every 4 (four) hours as needed for moderate pain.  11/15/14   Hanna Patel-Mills, PA-C  HYDROcodone-acetaminophen (NORCO/VICODIN) 5-325 MG tablet Take 2 tablets by mouth every 4 (four) hours as needed. 05/25/15   Samantha Tripp Dowless, PA-C  hydrocortisone (ANUSOL-HC) 25 MG suppository Place 1 suppository (25 mg total) rectally 2 (two) times daily as needed (rectal pain). Patient not taking: Reported on 11/17/2014 11/02/14   Janece Canterbury, MD  lidocaine (LIDODERM) 5 % Place 1 patch onto the skin daily. Remove & Discard patch within 12 hours or as directed by MD 11/09/14   Ripudeep Krystal Eaton, MD  loperamide (IMODIUM) 2 MG capsule Take 2 capsules (4 mg total) by mouth as needed for diarrhea or loose stools (over the counter. Take if diarrhea uncontrolled.). 11/09/14   Ripudeep Krystal Eaton, MD  Magnesium 300 MG CAPS Take 300 mg by mouth daily.    Historical Provider, MD  ondansetron (ZOFRAN ODT) 4 MG disintegrating tablet Take 1 tablet (4 mg total) by mouth every 8 (eight) hours as needed for nausea or vomiting. 11/02/14   Janece Canterbury, MD  predniSONE (DELTASONE) 10 MG tablet Take prednisone 40 mg (4 tabs) for a week, then taper by 5 mg weekly until off. 05/25/15   Samantha Tripp Dowless, PA-C  promethazine (PHENERGAN) 25 MG suppository Place 1 suppository (25 mg total) rectally every 6 (six) hours as needed for nausea or vomiting. 11/11/14   Blanchie Dessert, MD  promethazine (PHENERGAN) 25 MG tablet Take 1 tablet (25 mg total) by mouth every 6 (six) hours as needed for nausea or vomiting. Patient not taking: Reported on 11/17/2014 11/11/14   Blanchie Dessert, MD    Scheduled Meds: . ALPRAZolam  0.5 mg Oral BID  . amitriptyline  50 mg Oral QHS  . azaTHIOprine  175 mg Oral Daily  . cholestyramine light  4 g Oral 2 times per day  . enoxaparin (LOVENOX) injection  40 mg Subcutaneous Daily  . ferrous sulfate  325 mg Oral Q breakfast  . gabapentin  800 mg Oral TID  . [START ON  05/28/2015] lidocaine  1 patch Transdermal Q24H  . magnesium oxide  400 mg Oral Daily  . methylPREDNISolone (SOLU-MEDROL) injection  40 mg Intravenous Daily   Infusions: . 0.9 % NaCl with KCl 20 mEq / L 75  mL/hr at 05/27/15 0552   PRN Meds: acetaminophen **OR** acetaminophen, dicyclomine, fentaNYL (SUBLIMAZE) injection, ondansetron **OR** ondansetron (ZOFRAN) IV, ondansetron   Allergies as of 05/26/2015 - Review Complete 05/26/2015  Allergen Reaction Noted  . Lithium Other (See Comments) 10/25/2014  . Remicade [infliximab] Anaphylaxis 10/25/2014  . Compazine [prochlorperazine] Other (See Comments) 10/25/2014  . Ketorolac Other (See Comments) 11/05/2014  . Morphine Other (See Comments) 11/05/2014  . Nsaids Other (See Comments) 11/05/2014  . Ciprofloxacin Rash 11/05/2014  . Haldol [haloperidol lactate] Anxiety 05/25/2015  . Metronidazole Rash 11/05/2014    Family History  Problem Relation Age of Onset  . Hypertension Father     Social History   Social History  . Marital Status: Single    Spouse Name: N/A  . Number of Children: N/A  . Years of Education: N/A   Occupational History  . Not on file.   Social History Main Topics  . Smoking status: Current Every Day Smoker -- 0.50 packs/day for 5 years    Last Attempt to Quit: 11/04/2014  . Smokeless tobacco: Never Used  . Alcohol Use: No  . Drug Use: No  . Sexual Activity: Not on file   Other Topics Concern  . Not on file   Social History Narrative    REVIEW OF SYSTEMS: Constitutional:  No  weakness ENT:  No nose bleeds Pulm:  No SOB CV:  No palpitations, no LE edema.  GU:  No hematuria, no frequency GI:  Per HPI.  Has chronic ulcerated area with small volume bleeding at the lap scar on belly.  Heme:  No bleeding or bruising issues   MS:  No spine pain.  Transfusions:  Per HPI, none recently Neuro:  No headaches, no peripheral tingling or numbness Derm:  No itching, no rash or sores.  Endocrine:  No sweats or  chills.  No polyuria or dysuria Immunization:  Not queried.  Travel:  None beyond local counties in last few months.    PHYSICAL EXAM: Vital signs in last 24 hours: Filed Vitals:   05/27/15 0253 05/27/15 0942  BP: 117/66 119/73  Pulse: 58 55  Temp: 98.2 F (36.8 C) 97.7 F (36.5 C)  Resp: 18 18   Wt Readings from Last 3 Encounters:  05/25/15 113.399 kg (250 lb)  11/17/14 70.761 kg (156 lb)  11/16/14 68.04 kg (150 lb)    General: Cushingoid, short statured, overweight, pale WM.  uncomfortable Head:  No asymmetry, + moon-faced  Eyes:  No icterus or pallor Ears:  Not HOH  Nose:  No discharge Mouth:  Clear and moist Neck:  No mass or TMG Lungs:  Clear bil.  No SOB Heart: RRR Abdomen:  Protuberant, soft, tender without guard or rebound on left.  Normoactive BS.  Shallow and bloody area of ulceration on mid line scar.    Rectal: deferred   Musc/Skeltl: no joint erythema.  No swelling or contractures Extremities:  No pitting edema, just generalized mild swelling of limbs  Neurologic:  Oriented x 3.   No tremor.  No limb weakness. Skin:  No rash   Psych:  Cooperative, anxious.    Intake/Output from previous day:   Intake/Output this shift: Total I/O In: 60 [P.O.:60] Out: -   LAB RESULTS:  Recent Labs  05/26/15 2135 05/27/15 0526  WBC 8.0 7.7  HGB 10.9* 10.5*  HCT 33.3* 31.9*  PLT 340 289   BMET Lab Results  Component Value Date   NA 142 05/27/2015   NA 142 05/26/2015  NA 144 11/17/2014   K 3.9 05/27/2015   K 2.9* 05/26/2015   K 3.5 11/17/2014   CL 110 05/27/2015   CL 110 05/26/2015   CL 112* 11/17/2014   CO2 22 05/27/2015   CO2 25 05/26/2015   CO2 20* 11/17/2014   GLUCOSE 136* 05/27/2015   GLUCOSE 93 05/26/2015   GLUCOSE 89 11/17/2014   BUN 6 05/27/2015   BUN 9 05/26/2015   BUN 9 11/17/2014   CREATININE 0.83 05/27/2015   CREATININE 0.89 05/26/2015   CREATININE 0.97 11/17/2014   CALCIUM 7.4* 05/27/2015   CALCIUM 7.9* 05/26/2015   CALCIUM 9.2  11/17/2014   LFT  Recent Labs  05/26/15 2135 05/27/15 0526  PROT 5.8* 5.3*  ALBUMIN 3.4* 2.8*  AST 13* 15  ALT 13* 14*  ALKPHOS 62 61  BILITOT 0.3 0.5   PT/INR No results found for: INR, PROTIME Hepatitis Panel No results for input(s): HEPBSAG, HCVAB, HEPAIGM, HEPBIGM in the last 72 hours. C-Diff No components found for: CDIFF Lipase     Component Value Date/Time   LIPASE 23 05/26/2015 2135    Drugs of Abuse  No results found for: LABOPIA, COCAINSCRNUR, LABBENZ, AMPHETMU, THCU, LABBARB   RADIOLOGY STUDIES: Dg Abd 1 View  05/25/2015  CLINICAL DATA:  Left lower quadrant pain and nausea. History of Crohn's disease. EXAM: ABDOMEN - 1 VIEW COMPARISON:  11/15/2014 FINDINGS: Single dilated amorphous small bowel loop is present in the mid and right upper abdomen. No other associated dilated small bowel. Findings may reflect regional ileus. Clips are seen from prior surgery. Bony structures are unremarkable. No abnormal calcifications. IMPRESSION: Single prominent small bowel loop in the mid and right abdomen which is nonspecific. Electronically Signed   By: Aletta Edouard M.D.   On: 05/25/2015 21:02    ENDOSCOPIC STUDIES: Per HPI  IMPRESSION:   *  Complicated pt with crohns ileocolitis dating back to age 67.  Separate surgeries resulting in subtotal colectomy. Intolerant to several biologics maintained on Imuran and Prednisone.  GI care has been scattered across states of [place], [place] and now [place].   Colonoscopy 04/2014: small bowel disease and anal fibrosis/narrowing and ? Anal fistula Flex sig 9/206: anal stricture and ulcerations and active disease on biopsy, no CMV.  Currently experiencing recurrent flare of sxs.  C diff is negative.  Has not had repeat CT this admission.   High lifetime exposure of radiation.    *  Normocytic anemia.  On po iron at home.     PLAN:     *   Per Dr Loletha Carrow.    Azucena Freed  05/27/2015, 12:53 PM Pager: [phone]

## 2015-05-27 NOTE — H&P (Signed)
[facility] History and Physical  [name] ZOX:[id number] DOB: [birth year] DOA: 05/26/2015  Referring physician: Patient was transferred from [facility]. PCP: No primary care provider on file.  Specialists: Patient follows up with gastroenterologist in [place].  Chief Complaint: Abdominal pain.  HPI: Andre [name] is a 30 y.o. male with history of Crohn's disease and subtotal colectomy is presently on prednisone and Imuran presents to the ER because of increasing abdominal pain and left lower quadrant over the last 4 days. Has some nausea denies any vomiting. Has been having increasing diarrhea more than usual. Denies any recent use of antibiotics. Denies any fever or chills. Acute abdominal series done in the ER recently was not showing anything acute. Patient's potassium was on the lower side. Patient has been admitted for abdominal pain most likely from acute exacerbation of Crohn's and also correction of his potassium. Patient denies any blood in the diarrhea.  Review of Systems: As presented in the history of presenting illness, rest negative.  Past Medical History  Diagnosis Date  . Anxiety   . Bipolar depression (HCC)   . Crohn disease (HCC) 1997  . Anemia     Around 2013 required PRBC transfusions. Has received parenteral iron in past. Has taken oral iron in past.   Past Surgical History  Procedure Laterality Date  . Subtotal colectomy      Has undergone a total of 3 separate bowel resections including terminal ileal resection and the equivalent of subtotal colectomy. Last surgery was in 2010.  Marland Kitchen Flexible sigmoidoscopy N/A 10/29/2014    Procedure: FLEXIBLE SIGMOIDOSCOPY;  Surgeon: Ruffin Frederick, MD;  [facility] ENDOSCOPY;  Service: Gastroenterology;  Laterality: N/A;   Social History:  reports that he has been smoking.  He has never used smokeless tobacco. He reports that he does not drink alcohol or use illicit drugs. Where does patient live At  home. Can patient participate in ADLs? Yes.  Allergies  Allergen Reactions  . Lithium Other (See Comments)    Toxicity   . Remicade [Infliximab] Anaphylaxis  . Compazine [Prochlorperazine] Other (See Comments)    agitation  . Ketorolac Other (See Comments)    Other reaction(s): GI Distress  . Morphine Other (See Comments)    Other reaction(s): GI Distress  . Nsaids Other (See Comments)    Gi distress  . Ciprofloxacin Rash  . Haldol [Haloperidol Lactate] Anxiety  . Metronidazole Rash    Family History:  Family History  Problem Relation Age of Onset  . Hypertension Father       Prior to Admission medications   Medication Sig Start Date End Date Taking? Authorizing Provider  acetaminophen-codeine (TYLENOL #3) 300-30 MG per tablet Take 1 tablet by mouth every 6 (six) hours as needed for moderate pain. 11/17/14   Massie Maroon, FNP  ALPRAZolam Prudy Feeler) 0.5 MG tablet Take 0.5 mg by mouth 2 (two) times daily.    Historical Provider, MD  amitriptyline (ELAVIL) 50 MG tablet Take 1 tablet (50 mg total) by mouth at bedtime. 11/09/14   Ripudeep Jenna Luo, MD  azaTHIOprine (IMURAN) 50 MG tablet Take 175 mg by mouth daily.    Historical Provider, MD  cholestyramine light (PREVALITE) 4 G packet Take 1 packet (4 g total) by mouth 2 (two) times daily. 11/09/14   Ripudeep Jenna Luo, MD  dicyclomine (BENTYL) 20 MG tablet Take 1 tablet (20 mg total) by mouth 3 (three) times daily as needed for spasms. 11/09/14   Ripudeep Jenna Luo, MD  Ferrous Sulfate (IRON) 325 (65 FE) MG TABS Take 325 mg by mouth daily.    Historical Provider, MD  gabapentin (NEURONTIN) 800 MG tablet Take 800 mg by mouth 3 (three) times daily.    Historical Provider, MD  HYDROcodone-acetaminophen (NORCO/VICODIN) 5-325 MG per tablet Take 2 tablets by mouth every 4 (four) hours as needed. Patient taking differently: Take 2 tablets by mouth every 4 (four) hours as needed for moderate pain.  11/15/14   Hanna Patel-Mills, PA-C   HYDROcodone-acetaminophen (NORCO/VICODIN) 5-325 MG tablet Take 2 tablets by mouth every 4 (four) hours as needed. 05/25/15   Samantha Tripp Dowless, PA-C  hydrocortisone (ANUSOL-HC) 25 MG suppository Place 1 suppository (25 mg total) rectally 2 (two) times daily as needed (rectal pain). Patient not taking: Reported on 11/17/2014 11/02/14   Renae Fickle, MD  lidocaine (LIDODERM) 5 % Place 1 patch onto the skin daily. Remove & Discard patch within 12 hours or as directed by MD 11/09/14   Ripudeep Jenna Luo, MD  loperamide (IMODIUM) 2 MG capsule Take 2 capsules (4 mg total) by mouth as needed for diarrhea or loose stools (over the counter. Take if diarrhea uncontrolled.). 11/09/14   Ripudeep Jenna Luo, MD  Magnesium 300 MG CAPS Take 300 mg by mouth daily.    Historical Provider, MD  ondansetron (ZOFRAN ODT) 4 MG disintegrating tablet Take 1 tablet (4 mg total) by mouth every 8 (eight) hours as needed for nausea or vomiting. 11/02/14   Renae Fickle, MD  predniSONE (DELTASONE) 10 MG tablet Take prednisone 40 mg (4 tabs) for a week, then taper by 5 mg weekly until off. 05/25/15   Samantha Tripp Dowless, PA-C  promethazine (PHENERGAN) 25 MG suppository Place 1 suppository (25 mg total) rectally every 6 (six) hours as needed for nausea or vomiting. 11/11/14   Gwyneth Sprout, MD  promethazine (PHENERGAN) 25 MG tablet Take 1 tablet (25 mg total) by mouth every 6 (six) hours as needed for nausea or vomiting. Patient not taking: Reported on 11/17/2014 11/11/14   Gwyneth Sprout, MD    Physical Exam: Filed Vitals:   05/26/15 2158 05/26/15 2251 05/27/15 0134 05/27/15 0253  BP: 113/70 110/78 132/84 117/66  Pulse: 76 72 73 58  Temp: 98.3 F (36.8 C) 98.1 F (36.7 C) 98.2 F (36.8 C) 98.2 F (36.8 C)  TempSrc: Oral Oral Oral Oral  Resp: 18 18 16 18   SpO2: 96% 96% 96% 98%     General:  Moderately built and nourished.  Eyes: Anicteric no pallor.  ENT: No discharge from the ears eyes nose and mouth.  Neck: No  mass felt.  Cardiovascular: S1 and S2 heard.  Respiratory: No rhonchi or crepitations.  Abdomen: Mild tenderness in the left lower quadrant.  Skin: No rash.  Musculoskeletal: No edema.  Psychiatric: Appears normal.  Neurologic: Alert awake oriented to time place and person. Moves all extremities.  Labs on Admission:  Basic Metabolic Panel:  Recent Labs Lab 05/26/15 2135  NA 142  K 2.9*  CL 110  CO2 25  GLUCOSE 93  BUN 9  CREATININE 0.89  CALCIUM 7.9*  MG 1.4*   Liver Function Tests:  Recent Labs Lab 05/26/15 2135  AST 13*  ALT 13*  ALKPHOS 62  BILITOT 0.3  PROT 5.8*  ALBUMIN 3.4*    Recent Labs Lab 05/26/15 2135  LIPASE 23   No results for input(s): AMMONIA in the last 168 hours. CBC:  Recent Labs Lab 05/26/15 2135  WBC 8.0  NEUTROABS 5.7  HGB 10.9*  HCT 33.3*  MCV 83.3  PLT 340   Cardiac Enzymes: No results for input(s): CKTOTAL, CKMB, CKMBINDEX, TROPONINI in the last 168 hours.  BNP (last 3 results) No results for input(s): BNP in the last 8760 hours.  ProBNP (last 3 results) No results for input(s): PROBNP in the last 8760 hours.  CBG: No results for input(s): GLUCAP in the last 168 hours.  Radiological Exams on Admission: Dg Abd 1 View  05/25/2015  CLINICAL DATA:  Left lower quadrant pain and nausea. History of Crohn's disease. EXAM: ABDOMEN - 1 VIEW COMPARISON:  11/15/2014 FINDINGS: Single dilated amorphous small bowel loop is present in the mid and right upper abdomen. No other associated dilated small bowel. Findings may reflect regional ileus. Clips are seen from prior surgery. Bony structures are unremarkable. No abnormal calcifications. IMPRESSION: Single prominent small bowel loop in the mid and right abdomen which is nonspecific. Electronically Signed   By: Irish Lack M.D.   On: 05/25/2015 21:02    Assessment/Plan Principal Problem:   Exacerbation of Crohn's disease (HCC) Active Problems:   Crohn's colitis (HCC)    Hypokalemia   1. Abdominal pain and diarrhea most likely from exacerbation of his Crohn's - patient was given 1 dose of Solu-Medrol 125 mg IV in the ER. I'll continue with Solu-Medrol 40 mg IV daily along with Imuran. Check stool for C. difficile. Consult gastroenterologist in a.m. Continue with IV fluids. 2. Hypokalemia probably from diarrhea - patient did get oral potassium in the ER. I will recheck metabolic panel now and also magnesium levels. I have placed patient on IV fluids with potassium. 3. Chronic anemia - follow CBC. Patient denies any blood in the diarrhea. 4. History of anxiety and bipolar - continue present medications.   DVT Prophylaxis Lovenox.  Code Status: Full code.  Family Communication: Discussed with patient.  Disposition Plan: Admit to inpatient.   Nareg Breighner N. [facility] Pager [phone].  If 7PM-7AM, please contact night-coverage [email or web address] Password [email or web address] 05/27/2015, 5:10 AM

## 2015-05-28 DIAGNOSIS — R1032 Left lower quadrant pain: Secondary | ICD-10-CM | POA: Diagnosis not present

## 2015-05-28 DIAGNOSIS — G8929 Other chronic pain: Secondary | ICD-10-CM | POA: Diagnosis not present

## 2015-05-28 DIAGNOSIS — E876 Hypokalemia: Secondary | ICD-10-CM | POA: Diagnosis not present

## 2015-05-28 DIAGNOSIS — K50119 Crohn's disease of large intestine with unspecified complications: Secondary | ICD-10-CM

## 2015-05-28 DIAGNOSIS — K5090 Crohn's disease, unspecified, without complications: Secondary | ICD-10-CM

## 2015-05-28 LAB — BASIC METABOLIC PANEL
Anion gap: 9 (ref 5–15)
BUN: 6 mg/dL (ref 6–20)
CALCIUM: 8.1 mg/dL — AB (ref 8.9–10.3)
CO2: 24 mmol/L (ref 22–32)
CREATININE: 0.75 mg/dL (ref 0.61–1.24)
Chloride: 110 mmol/L (ref 101–111)
GFR calc Af Amer: >60 mL/min (ref >60)
Glucose, Bld: 85 mg/dL (ref 65–99)
Potassium: 3.7 mmol/L (ref 3.5–5.1)
SODIUM: 143 mmol/L (ref 135–145)

## 2015-05-28 LAB — CBC
HCT: 32.1 % — ABNORMAL LOW (ref 39.0–52.0)
HEMOGLOBIN: 10.4 g/dL — AB (ref 13.0–17.0)
MCH: 26.3 pg (ref 26.0–34.0)
MCHC: 32.4 g/dL (ref 30.0–36.0)
MCV: 81.3 fL (ref 78.0–100.0)
PLATELETS: 277 10*3/uL (ref 150–400)
RBC: 3.95 MIL/uL — AB (ref 4.22–5.81)
RDW: 14.3 % (ref 11.5–15.5)
WBC: 7.1 10*3/uL (ref 4.0–10.5)

## 2015-05-28 MED ORDER — VITAMIN B-12 1000 MCG PO TABS
1000.0000 ug | ORAL_TABLET | Freq: Every day | ORAL | Status: DC
  Administered 2015-05-29: 1000 ug via ORAL
  Filled 2015-05-28: qty 1

## 2015-05-28 MED ORDER — ACETAMINOPHEN 325 MG PO TABS
650.0000 mg | ORAL_TABLET | Freq: Three times a day (TID) | ORAL | Status: DC
  Administered 2015-05-28 – 2015-05-29 (×4): 650 mg via ORAL
  Filled 2015-05-28 (×4): qty 2

## 2015-05-28 MED ORDER — FERROUS SULFATE 325 (65 FE) MG PO TABS
325.0000 mg | ORAL_TABLET | Freq: Three times a day (TID) | ORAL | Status: DC
  Administered 2015-05-28 – 2015-05-29 (×3): 325 mg via ORAL
  Filled 2015-05-28 (×3): qty 1

## 2015-05-28 MED ORDER — HYDROMORPHONE HCL 1 MG/ML IJ SOLN
2.0000 mg | INTRAMUSCULAR | Status: DC | PRN
  Administered 2015-05-28 – 2015-05-29 (×8): 2 mg via INTRAVENOUS
  Filled 2015-05-28 (×8): qty 2

## 2015-05-28 MED ORDER — PREDNISONE 20 MG PO TABS
20.0000 mg | ORAL_TABLET | Freq: Every day | ORAL | Status: DC
  Administered 2015-05-29: 20 mg via ORAL
  Filled 2015-05-28: qty 1

## 2015-05-28 MED ORDER — OXYCODONE HCL 5 MG PO TABS
10.0000 mg | ORAL_TABLET | ORAL | Status: DC
  Administered 2015-05-28 – 2015-05-29 (×6): 10 mg via ORAL
  Filled 2015-05-28 (×6): qty 2

## 2015-05-28 NOTE — Progress Notes (Signed)
[facility] PROGRESS NOTE  Taylan Marez ZOX:[id number] DOB: [birth year] DOA: 05/26/2015 PCP: No primary care provider on file.  Brief Summary  Andre [name] is a 30 y.o. male with history of Crohn's disease and subtotal colectomy is presently on prednisone and Imuran presented to the ER because of increasing abdominal pain and left lower quadrant over the last 4 days.  Stools are more frequent and softer than usual. Denies nausea, vomiting, and fevers. Denied recent use of antibiotics, fever or chills.  Patient admitted for abdominal pain most likely from acute exacerbation of Crohn's and also correction of his potassium. Patient denies any blood in stools.  Assessment/Plan  Acute on chronic abdominal pain, persistent.  Patient seen by GI who feels that his pain is not obviously related to his Crohn's disease.  He has had mild dilation of a SB loop on XR, but imaging otherwise unremarkable.  Last flex sig on 10/2014 demonstrated only minor proctitis.   -  C. Diff negative -  Continue oxycodone -  Increase dilaudid for breakthrough symptoms - No PCA -  Anticipate discharge on oral pain medication soon -  Steroids changed to oral prednisone today by GI -  Diet advanced  Bipolar disorder  Hypoalbuminemic, acute phase reactant or could reflect nutritional status  Iron deficiency anemia - increase iron to TID  Diet:  soft Access:  PIV IVF:  off Proph:  lovenox  Code Status: full Family Communication: patient alone Disposition Plan: pending tolerating diet, pain controlled with oral pain medications   Consultants:  [facility]  Procedures:  none  Antibiotics:  none   HPI/Subjective:  Ongoing pain in the LLQ that is no better than before.  Denies SOB, nausea, vomiting.  Stools are less frequent but still pudding consistency, no blood    Objective: Filed Vitals:   05/26/15 2251 05/27/15 0134 05/27/15 0253 05/27/15 0942  BP: 110/78 132/84 117/66 119/73  Pulse: 72  73 58 55  Temp: 98.1 F (36.7 C) 98.2 F (36.8 C) 98.2 F (36.8 C) 97.7 F (36.5 C)  TempSrc: Oral Oral Oral Oral  Resp: SpO2: 96% 96% 98% 98%    Intake/Output Summary (Last 24 hours) at 05/28/15 1606 Last data filed at 05/28/15 0807  Gross per 24 hour  Intake    835 ml  Output    700 ml  Net    135 ml   There were no vitals filed for this visit. There is no weight on file to calculate BMI.  Exam:   General:  Adult male, No acute distress  HEENT:  NCAT, MMM  Cardiovascular:  RRR, nl S1, S2 no mrg, 2+ pulses, warm extremities  Respiratory:  CTAB, no increased WOB  Abdomen:   NABS, soft, midline incision with small amount of serous drainage, no foul odor, erythema or purulence.  Mild TTP in the LLQ  MSK:   Normal tone and bulk, no LEE  Neuro:  Grossly intact  Data Reviewed: Basic Metabolic Panel:  Recent Labs Lab 05/26/15 2135 05/27/15 0526 05/28/15 0601  NA 142 142 143  K 2.9* 3.9 3.7  CL 110 110 110  CO2 GLUCOSE 93 136* 85  BUN CREATININE 0.89 0.83 0.75  CALCIUM 7.9* 7.4* 8.1*  MG 1.4* 1.7  --    Liver Function Tests:  Recent Labs Lab 05/26/15 2135 05/27/15 0526  AST 13* 15  ALT 13* 14*  ALKPHOS 62 61  BILITOT 0.3  0.5  PROT 5.8* 5.3*  ALBUMIN 3.4* 2.8*    Recent Labs Lab 05/26/15 2135  LIPASE 23   No results for input(s): AMMONIA in the last 168 hours. CBC:  Recent Labs Lab 05/26/15 2135 05/27/15 0526 05/28/15 0601  WBC 8.0 7.7 7.1  NEUTROABS 5.7 7.4  --   HGB 10.9* 10.5* 10.4*  HCT 33.3* 31.9* 32.1*  MCV 83.3 82.0 81.3  PLT 340 289 277    Recent Results (from the past 240 hour(s))  C difficile quick scan w PCR reflex     Status: None   Collection Time: 05/27/15 10:48 AM  Result Value Ref Range Status   C Diff antigen NEGATIVE NEGATIVE Final   C Diff toxin NEGATIVE NEGATIVE Final   C Diff interpretation Negative for toxigenic C. difficile  Final     Studies: No results  found.  Scheduled Meds: . acetaminophen  650 mg Oral TID  . ALPRAZolam  0.5 mg Oral BID  . amitriptyline  50 mg Oral QHS  . azaTHIOprine  175 mg Oral Daily  . cholestyramine light  4 g Oral 2 times per day  . enoxaparin (LOVENOX) injection  40 mg Subcutaneous Daily  . ferrous sulfate  325 mg Oral Q breakfast  . gabapentin  800 mg Oral TID  . lidocaine  1 patch Transdermal Q24H  . magnesium oxide  400 mg Oral Daily  . oxyCODONE  10 mg Oral 6 times per day  . [START ON 05/29/2015] predniSONE  20 mg Oral Q breakfast   Continuous Infusions:   Principal Problem:   Exacerbation of Crohn's disease (HCC) Active Problems:   Crohn's colitis (HCC)   Hypokalemia   Crohn's disease of both small and large intestine without complication (HCC)   LLQ abdominal pain   Functional diarrhea    Time spent: 30 min    Fiora Weill  [facility] Pager [phone]. If 7PM-7AM, please contact night-coverage at [email or web address], password [facility] 05/28/2015, 4:06 PM  LOS: 1 day

## 2015-05-28 NOTE — Progress Notes (Signed)
          Daily Rounding Note  05/28/2015, 10:11 AM  LOS: 1 day   SUBJECTIVE:       Less frequent, loose/non-bloody BMs.  Pain persists but overall improved.  Tolerating full liquids, asking about solid foods.   OBJECTIVE:         Vital signs in last 24 hours:      Last BM Date: 05/27/15 There were no vitals filed for this visit. General: looks the same: cushingoid, chronically ill but more relaxed and comfortable   Heart: RRR Chest: clear bil.  No trouble breathing.   Abdomen: soft, ND.  Active, normal, BS.  Still tender to very light touch on the left  Extremities: no CCE, just generally swollen/cushigoid looking Neuro/Psych:  Calm, cooperative, alert.   Intake/Output from previous day: 04/05 0701 - 04/06 0700 In: 895 [P.O.:60; I.V.:835] Out: -   Intake/Output this shift: Total I/O In: -  Out: 700 [Urine:700]  Lab Results:  Recent Labs  05/26/15 2135 05/27/15 0526 05/28/15 0601  WBC 8.0 7.7 7.1  HGB 10.9* 10.5* 10.4*  HCT 33.3* 31.9* 32.1*  PLT 340 289 277   BMET  Recent Labs  05/26/15 2135 05/27/15 0526 05/28/15 0601  NA 142 142 143  K 2.9* 3.9 3.7  CL 110 110 110  CO2 25 22 24   GLUCOSE 93 136* 85  BUN 9 6 6   CREATININE 0.89 0.83 0.75  CALCIUM 7.9* 7.4* 8.1*   LFT  Recent Labs  05/26/15 2135 05/27/15 0526  PROT 5.8* 5.3*  ALBUMIN 3.4* 2.8*  AST 13* 15  ALT 13* 14*  ALKPHOS 62 61  BILITOT 0.3 0.5    Studies/Results: No results found.  ASSESMENT:   *  Acute flare of chronic abdominal pain in pt with extensive medical and surgical hx of Crohns ileocolitis.  Minor proctitis at region of fixed anal stricture by 10/2014 Flex sig.   Non-specific loop SB prominence on plain xray 3 days ago.   Strong functional component to his recurrences of acute on chronic pain. .  *  Bipolar disorder.   *  Low albumin and looks malnourished.    *  Stable anemia, on once daily po iron at home. Anemia  panel in 10/2014: iron deficient, B12 and folate ok.    PLAN   *  Going forward, needs to establish with outpt pain mgt clinic.   *  ? Increase po Iron from once daily to BID?   *  Low residue soft diet.   *  No need for IV steroids, restart outpt prednisone at 20 mg daily.  Does not need the recently increased 40 mg dose.     Jennye Moccasin  05/28/2015, 10:11 AM Pager: [phone]  I have reviewed the entire case in detail with the above APP and discussed the plan in detail.  Therefore, I agree with the diagnoses recorded above. In addition,  I have personally interviewed and examined the patient and have personally reviewed any abdominal/pelvic CT scan images.  My additional thoughts are as follows:  He is clinically improving, and pain control appears to be what he needs the most.  He will need to be connected to outpatient pain clinic, as our practice does not manage chronic narcotic analgesics. We will be happy to manage his complicated Crohn's disease.   Charlie Pitter III Pager [phone]  Mon-Fri 8a-5p [phone] after 5p, weekends, holidays

## 2015-05-29 DIAGNOSIS — G8929 Other chronic pain: Principal | ICD-10-CM

## 2015-05-29 DIAGNOSIS — K591 Functional diarrhea: Secondary | ICD-10-CM | POA: Diagnosis not present

## 2015-05-29 DIAGNOSIS — R1013 Epigastric pain: Secondary | ICD-10-CM

## 2015-05-29 DIAGNOSIS — R1032 Left lower quadrant pain: Secondary | ICD-10-CM | POA: Diagnosis not present

## 2015-05-29 DIAGNOSIS — E876 Hypokalemia: Secondary | ICD-10-CM | POA: Diagnosis not present

## 2015-05-29 DIAGNOSIS — K5090 Crohn's disease, unspecified, without complications: Secondary | ICD-10-CM | POA: Diagnosis not present

## 2015-05-29 LAB — BASIC METABOLIC PANEL
Anion gap: 11 (ref 5–15)
BUN: 9 mg/dL (ref 6–20)
CHLORIDE: 105 mmol/L (ref 101–111)
CO2: 26 mmol/L (ref 22–32)
CREATININE: 0.89 mg/dL (ref 0.61–1.24)
Calcium: 7.8 mg/dL — ABNORMAL LOW (ref 8.9–10.3)
GFR calc Af Amer: >60 mL/min (ref >60)
Glucose, Bld: 99 mg/dL (ref 65–99)
Potassium: 3.1 mmol/L — ABNORMAL LOW (ref 3.5–5.1)
SODIUM: 142 mmol/L (ref 135–145)

## 2015-05-29 LAB — CBC
HCT: 33.4 % — ABNORMAL LOW (ref 39.0–52.0)
Hemoglobin: 11 g/dL — ABNORMAL LOW (ref 13.0–17.0)
MCH: 27.4 pg (ref 26.0–34.0)
MCHC: 32.9 g/dL (ref 30.0–36.0)
MCV: 83.1 fL (ref 78.0–100.0)
PLATELETS: 272 10*3/uL (ref 150–400)
RBC: 4.02 MIL/uL — ABNORMAL LOW (ref 4.22–5.81)
RDW: 14.4 % (ref 11.5–15.5)
WBC: 6.9 10*3/uL (ref 4.0–10.5)

## 2015-05-29 MED ORDER — HYDROMORPHONE HCL 1 MG/ML IJ SOLN: 2.0000 mg | Freq: Four times a day (QID) | INTRAMUSCULAR | Status: DC | PRN

## 2015-05-29 MED ORDER — CYANOCOBALAMIN 1000 MCG PO TABS: 1000.0000 ug | ORAL_TABLET | Freq: Every day | ORAL | Status: DC

## 2015-05-29 MED ORDER — OXYCODONE-ACETAMINOPHEN 10-325 MG PO TABS: 1.0000 | ORAL_TABLET | ORAL | Status: DC | PRN

## 2015-05-29 MED ORDER — PREDNISONE 20 MG PO TABS: 20.0000 mg | ORAL_TABLET | Freq: Every day | ORAL | Status: DC

## 2015-05-29 MED ORDER — CHOLESTYRAMINE LIGHT 4 G PO PACK: 4.0000 g | PACK | Freq: Two times a day (BID) | ORAL | Status: DC

## 2015-05-29 MED ORDER — DICYCLOMINE HCL 20 MG PO TABS: 20.0000 mg | ORAL_TABLET | Freq: Three times a day (TID) | ORAL | Status: DC | PRN

## 2015-05-29 MED ORDER — POTASSIUM CHLORIDE CRYS ER 20 MEQ PO TBCR
40.0000 meq | EXTENDED_RELEASE_TABLET | Freq: Once | ORAL | Status: AC
  Administered 2015-05-29: 40 meq via ORAL
  Filled 2015-05-29: qty 2

## 2015-05-29 MED ORDER — AMITRIPTYLINE HCL 50 MG PO TABS: 50.0000 mg | ORAL_TABLET | Freq: Every day | ORAL | Status: DC

## 2015-05-29 MED ORDER — HYDROMORPHONE HCL 1 MG/ML IJ SOLN: 1.0000 mg | Freq: Four times a day (QID) | INTRAMUSCULAR | Status: DC | PRN

## 2015-05-29 MED ORDER — OXYCODONE HCL 5 MG PO TABS
15.0000 mg | ORAL_TABLET | ORAL | Status: DC
  Administered 2015-05-29: 15 mg via ORAL
  Filled 2015-05-29 (×2): qty 3

## 2015-05-29 MED ORDER — MAGNESIUM OXIDE 400 (241.3 MG) MG PO TABS: 400.0000 mg | ORAL_TABLET | Freq: Every day | ORAL | Status: DC

## 2015-05-29 MED ORDER — FERROUS SULFATE 325 (65 FE) MG PO TABS: 325.0000 mg | ORAL_TABLET | Freq: Two times a day (BID) | ORAL | Status: DC

## 2015-05-29 NOTE — Care Management Important Message (Signed)
Important Message  Patient Details  Name: Andre [name] MRN: [id number] Date of Birth: 06/15/1985   Medicare Important Message Given:  Yes    [name] Abena 05/29/2015, 10:30 AM

## 2015-05-29 NOTE — Discharge Instructions (Signed)
Crohn Disease Crohn disease is a long-lasting (chronic) disease that affects your gastrointestinal (GI) tract. It often causes irritation and swelling (inflammation) in your small intestine and the beginning of your large intestine. However, it can affect any part of your GI tract. Crohn disease is part of a group of illnesses that are known as inflammatory bowel disease (IBD). Crohn disease may start slowly and get worse over time. Symptoms may come and go. They may also disappear for months or even years at a time (remission). CAUSES The exact cause of Crohn disease is not known. It may be a response that causes your body's defense system (immune system) to mistakenly attack healthy cells and tissues (autoimmune response). Your genes and your environment may also play a role. RISK FACTORS You may be at greater risk for Crohn disease if you:  Have other family members with Crohn disease or another IBD.  Use any tobacco products, including cigarettes, chewing tobacco, or electronic cigarettes.  Are in your 20s.  Have Eastern European ancestry. SIGNS AND SYMPTOMS The main signs and symptoms of Crohn disease involve your GI tract. These include:  Diarrhea.  Rectal bleeding.  An urgent need to move your bowels.  The feeling that you are not finished having a bowel movement.  Abdominal pain or cramping.  Constipation. General signs and symptoms of Crohn disease may also include:  Unexplained weight loss.  Fatigue.  Fever.  Nausea.  Loss of appetite.  Joint pain  Changes in vision.  Red bumps on your skin. DIAGNOSIS Your health care provider may suspect Crohn disease based on your symptoms and your medical history. Your health care provider will do a physical exam. You may need to see a health care provider who specializes in diseases of the digestive tract (gastroenterologist). You may also have tests to help your health care providers make a diagnosis. These may  include:  Blood tests.  Stool sample tests.  Imaging tests, such as X-rays and CT scans.  Tests to examine the inside of your intestines using a long, flexible tube that has a light and a camera on the end (endoscopy or colonoscopy).  A procedure to take tissue samples from inside your bowel (biopsy) to be examined under a microscope. TREATMENT  There is no cure for Crohn disease. Treatment will focus on managing your symptoms. Crohn disease affects each person differently. Your treatment may include:  Resting your bowels. Drinking only clear liquids or getting nutrition through an IV for a period of time gives your bowels a chance to heal because they are not passing stools.  Medicines. These may be used alone or in combination (combination therapy). These may include antibiotic medicines. You may be given medicines that help to:  Reduce inflammation.  Control your immune system activity.  Fight infections.  Relieve cramps and prevent diarrhea.  Control your pain.  Surgery. You may need surgery if:  Medicines and other treatments are no longer working.  You develop complications from severe Crohn disease.  A section of your intestine becomes so damaged that it needs to be removed. HOME CARE INSTRUCTIONS  Take medicines only as directed by your health care provider.  If you were prescribed an antibiotic medicine, finish it all even if you start to feel better.  Keep all follow-up visits as directed by your health care provider. This is important.  Talk with your health care provider about changing your diet. This may help your symptoms. Your health care provide may recommend changes, such   as:  Drinking more fluids.  Avoiding milk and other foods that contain lactose.  Eating a low-fat diet.  Avoiding high-fiber foods, such as popcorn and nuts.  Avoiding carbonated beverages, such as soda.  Eating smaller meals more often rather than eating large  meals.  Keeping a food diary to identify foods that make your symptoms better or worse.  Do not use any tobacco products, including cigarettes, chewing tobacco, or electronic cigarettes. If you need help quitting, ask your health care provider.  Limit alcohol intake to no more than 1 drink per day for nonpregnant women and 2 drinks per day for men. One drink equals 12 ounces of beer, 5 ounces of wine, or 1 ounces of hard liquor.  Exercise daily or as directed by your health care provider. SEEK MEDICAL CARE IF:  You have diarrhea, abdominal cramps, and other gastrointestinal problems that are present almost all of the time.  Your symptoms do not improve with treatment.  You continue to lose weight.  You develop a rash or sores on your skin.  You develop eye problems.  You have a fever.   Your symptoms get worse.  You develop new symptoms. SEEK IMMEDIATE MEDICAL CARE IF:  You have bloody diarrhea.  You develop severe abdominal pain.  You cannot pass stools.   This information is not intended to replace advice given to you by your health care provider. Make sure you discuss any questions you have with your health care provider.   Document Released: 11/17/2004 Document Revised: 02/28/2014 Document Reviewed: 09/25/2013 Elsevier Interactive Patient Education 2016 [employer] Inc[employer]  

## 2015-05-29 NOTE — Discharge Summary (Addendum)
Physician Discharge Summary  Amer Alcindor ZOX:[id number] DOB: 03/11/1985 DOA: 05/26/2015  PCP: No primary care provider on file.  Admit date: 05/26/2015 Discharge date: 05/29/2015  Recommendations for Outpatient Follow-up:  1. F/u with gastroenterology within 1 month 2. Patient asked to establish primary care doctor and pain clinic 3. Repeat BMP, magnesium and CBC at next visit.  Discharge Diagnoses:  Principal Problem:   Chronic pain disorder  Active Problems:   Crohn's colitis (HCC)   Hypokalemia   Crohn's disease of both small and large intestine without complication (HCC)   LLQ abdominal pain   Functional diarrhea   Discharge Condition: Stable, improved  Diet recommendation: Soft  Wt Readings from Last 3 Encounters:  05/28/15 74.39 kg (164 lb)  05/25/15 113.399 kg (250 lb)  11/17/14 70.761 kg (156 lb)    History of present illness:  The patient is a 30 year old male with history of Crohn's disease status post subtotal colectomy who has been maintained on prednisone and Imuran. About 3 weeks ago he moved back from the [place] to [place] to stay permanently.  He presented with increasing left lower quadrant pain, increased frequency of stools, and softer stools than usual. He denied for nausea, vomiting, fevers.  Per notes he has a history of chronic pain.  Hospital Course:   Acute on chronic abdominal pain, persistent. He was seen by gastroenterology who felt that his pain was not obviously related to his Crohn's disease. He received higher doses of prednisone for a couple of days but his dose was subsequently reduced back to his base dose of 20 mg daily.  His cholestyramine, Bentyl, and amitriptyline were resumed.  He had a decrease in the frequency of his stools however his stools continued to be softer than usual. His C. difficile PCR was negative. His KUB demonstrated mild dilation of a small bowel loop but was otherwise unremarkable. He was given oxycodone with IV  Dilaudid for breakthrough symptoms and is being discharged on Percocet. He was able to tolerate a regular diet prior to discharge. He had no blood or mucus in his stools.  Hypokalemia, treated with intermittent doses of potassium.    Hypomagnesemia, given IV magnesium supplementation.  Iron deficiency anemia with baseline hemoglobin of around 11 mg/dL. His iron supplementation was increased. He will need a repeat CBC in approximately one month to document improvement in hemoglobin.  Bipolar disorder, stable.  Continue Xanax.    Hypoalbuminemia, acute phase reactant or could reflect nutritional status.  Recommend supplemental nutrition if he is unable to eat regular meals.  Procedures:   none  Consultations:   [facility]  Discharge Exam: Filed Vitals:   05/29/15 0502 05/29/15 1000  BP: 133/81 109/55  Pulse: 74 61  Temp: 97.6 F (36.4 C) 98.6 F (37 C)  Resp: 18 18   Filed Vitals:   05/28/15 2212 05/28/15 2215 05/29/15 0502 05/29/15 1000  BP: 112/68  133/81 109/55  Pulse: 78  74 61  Temp: 98.5 F (36.9 C)  97.6 F (36.4 C) 98.6 F (37 C)  TempSrc: Oral  Oral Oral  Resp: Height:   (1.575 m)    Weight:  74.39 kg (164 lb)    SpO2: 97%  99% 98%    General:  adult male, no acute distress Cardiovascular:  regular rate and rhythm, no murmurs rubs or gallops Respiratory:  clear to auscultation bilaterally Abdomen: NABS, soft, nondistended, tender to palpation in the left lower quadrant and left upper  quadrant without rebound or guarding. Midline incision with a 2 cm area of granulation tissue, appears well-healed with minimal discharge. MSK: Normal tone and bulk, no lower extremity edema  Discharge Instructions      Discharge Instructions    Call MD for:  difficulty breathing, headache or visual disturbances    Complete by:  As directed      Call MD for:  extreme fatigue    Complete by:  As directed      Call MD for:  hives    Complete  by:  As directed      Call MD for:  persistant dizziness or light-headedness    Complete by:  As directed      Call MD for:  persistant nausea and vomiting    Complete by:  As directed      Call MD for:  severe uncontrolled pain    Complete by:  As directed      Call MD for:  temperature >100.4    Complete by:  As directed      Diet - low sodium heart healthy    Complete by:  As directed      Discharge instructions    Complete by:  As directed   Please establish care with a primary care doctor and get a referral to a pain management clinic. Please schedule an appointment with Dr. Earlene Plater from gastroenterology within 1 month of discharge.     Increase activity slowly    Complete by:  As directed             Medication List    STOP taking these medications        Magnesium 300 MG Caps  Replaced by:  magnesium oxide 400 (241.3 Mg) MG tablet      TAKE these medications        ALPRAZolam 0.5 MG tablet  Commonly known as:  XANAX  Take 0.5 mg by mouth 2 (two) times daily.     amitriptyline 50 MG tablet  Commonly known as:  ELAVIL  Take 1 tablet (50 mg total) by mouth at bedtime.     azaTHIOprine 50 MG tablet  Commonly known as:  IMURAN  Take 175 mg by mouth daily.     cholestyramine light 4 g packet  Commonly known as:  PREVALITE  Take 1 packet (4 g total) by mouth 2 (two) times daily.     cyanocobalamin 1000 MCG tablet  Take 1 tablet (1,000 mcg total) by mouth daily.     dicyclomine 20 MG tablet  Commonly known as:  BENTYL  Take 1 tablet (20 mg total) by mouth 3 (three) times daily as needed for spasms.     ferrous sulfate 325 (65 FE) MG tablet  Take 1 tablet (325 mg total) by mouth 2 (two) times daily with a meal.     gabapentin 800 MG tablet  Commonly known as:  NEURONTIN  Take 800 mg by mouth 3 (three) times daily.     magnesium oxide 400 (241.3 Mg) MG tablet  Commonly known as:  MAG-OX  Take 1 tablet (400 mg total) by mouth daily.     ondansetron 4 MG  disintegrating tablet  Commonly known as:  ZOFRAN ODT  Take 1 tablet (4 mg total) by mouth every 8 (eight) hours as needed for nausea or vomiting.     oxyCODONE-acetaminophen 10-325 MG tablet  Commonly known as:  PERCOCET  Take 1 tablet by mouth every 4 (four) hours  as needed for pain.     predniSONE 20 MG tablet  Commonly known as:  DELTASONE  Take 1 tablet (20 mg total) by mouth daily with breakfast.       Follow-up Information    Follow up with Charlie Pitter III, MD. Schedule an appointment as soon as possible for a visit in 1 month.   Specialty:  Gastroenterology   Contact information:   [address] [place] [address] [place] [address] [phone]       Follow up with Primary care doctor In 2 weeks.      Follow up with Local pain clinic. Schedule an appointment as soon as possible for a visit in 2 weeks.       The results of significant diagnostics from this hospitalization (including imaging, microbiology, ancillary and laboratory) are listed below for reference.    Significant Diagnostic Studies: Dg Abd 1 View  05/25/2015  CLINICAL DATA:  Left lower quadrant pain and nausea. History of Crohn's disease. EXAM: ABDOMEN - 1 VIEW COMPARISON:  11/15/2014 FINDINGS: Single dilated amorphous small bowel loop is present in the mid and right upper abdomen. No other associated dilated small bowel. Findings may reflect regional ileus. Clips are seen from prior surgery. Bony structures are unremarkable. No abnormal calcifications. IMPRESSION: Single prominent small bowel loop in the mid and right abdomen which is nonspecific. Electronically Signed   By: Irish Lack M.D.   On: 05/25/2015 21:02    Microbiology: Recent Results (from the past 240 hour(s))  C difficile quick scan w PCR reflex     Status: None   Collection Time: 05/27/15 10:48 AM  Result Value Ref Range Status   C Diff antigen NEGATIVE NEGATIVE Final   C Diff toxin NEGATIVE NEGATIVE Final   C Diff interpretation Negative  for toxigenic C. difficile  Final     Labs: Basic Metabolic Panel:  Recent Labs Lab 05/26/15 2135 05/27/15 0526 05/28/15 0601 05/29/15 0447  NA 142 142 143 142  K 2.9* 3.9 3.7 3.1*  CL 110 110 110 105  CO2 25 22 24 26   GLUCOSE 93 136* 85 99  BUN 9 6 6 9   CREATININE 0.89 0.83 0.75 0.89  CALCIUM 7.9* 7.4* 8.1* 7.8*  MG 1.4* 1.7  --   --    Liver Function Tests:  Recent Labs Lab 05/26/15 2135 05/27/15 0526  AST 13* 15  ALT 13* 14*  ALKPHOS 62 61  BILITOT 0.3 0.5  PROT 5.8* 5.3*  ALBUMIN 3.4* 2.8*    Recent Labs Lab 05/26/15 2135  LIPASE 23   No results for input(s): AMMONIA in the last 168 hours. CBC:  Recent Labs Lab 05/26/15 2135 05/27/15 0526 05/28/15 0601 05/29/15 0447  WBC 8.0 7.7 7.1 6.9  NEUTROABS 5.7 7.4  --   --   HGB 10.9* 10.5* 10.4* 11.0*  HCT 33.3* 31.9* 32.1* 33.4*  MCV 83.3 82.0 81.3 83.1  PLT 340 289 277 272   Cardiac Enzymes: No results for input(s): CKTOTAL, CKMB, CKMBINDEX, TROPONINI in the last 168 hours. BNP: BNP (last 3 results) No results for input(s): BNP in the last 8760 hours.  ProBNP (last 3 results) No results for input(s): PROBNP in the last 8760 hours.  CBG: No results for input(s): GLUCAP in the last 168 hours.  Time coordinating discharge:  35 minutes  Signed:  Elley Harp  [facility] 05/29/2015, 12:35 PM

## 2015-05-29 NOTE — Progress Notes (Signed)
Patient Discharge: Disposition: Patient discharged to home. Education: Reviewed medications, prescriptions, follow-up appointments and discharge instructions, understood and acknowledged. IV: Discontinued IV before discharge. Transportation: Patient transported in w/c out of the unit escorted by the volunteer. Belongings: Patient took all his belongings with him.

## 2015-05-31 ENCOUNTER — Encounter (HOSPITAL_COMMUNITY): Payer: Self-pay | Admitting: *Deleted

## 2015-05-31 ENCOUNTER — Encounter (HOSPITAL_BASED_OUTPATIENT_CLINIC_OR_DEPARTMENT_OTHER): Payer: Self-pay | Admitting: Emergency Medicine

## 2015-05-31 ENCOUNTER — Emergency Department (HOSPITAL_BASED_OUTPATIENT_CLINIC_OR_DEPARTMENT_OTHER)
Admission: EM | Admit: 2015-05-31 | Discharge: 2015-05-31 | Disposition: A | Discharge status: Other Acute Inpatient Hospital | Payer: Medicare Other | Attending: Emergency Medicine | Admitting: Emergency Medicine

## 2015-05-31 ENCOUNTER — Inpatient Hospital Stay (HOSPITAL_COMMUNITY)
Admission: AD | Admit: 2015-05-31 | Discharge: 2015-06-09 | DRG: 885 | Disposition: A | Discharge status: Home / Self Care | Payer: Medicare Other | Source: Intra-hospital | Attending: Emergency Medicine | Admitting: Emergency Medicine

## 2015-05-31 DIAGNOSIS — D72829 Elevated white blood cell count, unspecified: Secondary | ICD-10-CM | POA: Diagnosis present

## 2015-05-31 DIAGNOSIS — F319 Bipolar disorder, unspecified: Secondary | ICD-10-CM | POA: Diagnosis not present

## 2015-05-31 DIAGNOSIS — R45851 Suicidal ideations: Secondary | ICD-10-CM | POA: Diagnosis present

## 2015-05-31 DIAGNOSIS — F4310 Post-traumatic stress disorder, unspecified: Secondary | ICD-10-CM | POA: Diagnosis present

## 2015-05-31 DIAGNOSIS — F17200 Nicotine dependence, unspecified, uncomplicated: Secondary | ICD-10-CM | POA: Insufficient documentation

## 2015-05-31 DIAGNOSIS — Z818 Family history of other mental and behavioral disorders: Secondary | ICD-10-CM

## 2015-05-31 DIAGNOSIS — D649 Anemia, unspecified: Secondary | ICD-10-CM | POA: Diagnosis not present

## 2015-05-31 DIAGNOSIS — Z8719 Personal history of other diseases of the digestive system: Secondary | ICD-10-CM | POA: Insufficient documentation

## 2015-05-31 DIAGNOSIS — F419 Anxiety disorder, unspecified: Secondary | ICD-10-CM | POA: Diagnosis present

## 2015-05-31 DIAGNOSIS — I959 Hypotension, unspecified: Secondary | ICD-10-CM | POA: Diagnosis present

## 2015-05-31 DIAGNOSIS — G4700 Insomnia, unspecified: Secondary | ICD-10-CM | POA: Diagnosis present

## 2015-05-31 DIAGNOSIS — Z8249 Family history of ischemic heart disease and other diseases of the circulatory system: Secondary | ICD-10-CM

## 2015-05-31 DIAGNOSIS — Z79899 Other long term (current) drug therapy: Secondary | ICD-10-CM | POA: Insufficient documentation

## 2015-05-31 LAB — RAPID URINE DRUG SCREEN, HOSP PERFORMED
Amphetamines: NOT DETECTED
BENZODIAZEPINES: POSITIVE — AB
Barbiturates: NOT DETECTED
Cocaine: NOT DETECTED
Opiates: POSITIVE — AB
Tetrahydrocannabinol: NOT DETECTED

## 2015-05-31 LAB — COMPREHENSIVE METABOLIC PANEL
ALBUMIN: 4.1 g/dL (ref 3.5–5.0)
ALT: 17 U/L (ref 17–63)
ANION GAP: 7 (ref 5–15)
AST: 20 U/L (ref 15–41)
Alkaline Phosphatase: 63 U/L (ref 38–126)
BILIRUBIN TOTAL: 0.7 mg/dL (ref 0.3–1.2)
BUN: 10 mg/dL (ref 6–20)
CO2: 21 mmol/L — ABNORMAL LOW (ref 22–32)
Calcium: 8.7 mg/dL — ABNORMAL LOW (ref 8.9–10.3)
Chloride: 108 mmol/L (ref 101–111)
Creatinine, Ser: 0.74 mg/dL (ref 0.61–1.24)
GFR calc non Af Amer: >60 mL/min (ref >60)
GLUCOSE: 107 mg/dL — AB (ref 65–99)
POTASSIUM: 3.3 mmol/L — AB (ref 3.5–5.1)
SODIUM: 136 mmol/L (ref 135–145)
TOTAL PROTEIN: 7.1 g/dL (ref 6.5–8.1)

## 2015-05-31 LAB — CBC
HCT: 38.7 % — ABNORMAL LOW (ref 39.0–52.0)
Hemoglobin: 12.8 g/dL — ABNORMAL LOW (ref 13.0–17.0)
MCH: 27 pg (ref 26.0–34.0)
MCHC: 33.1 g/dL (ref 30.0–36.0)
MCV: 81.6 fL (ref 78.0–100.0)
PLATELETS: 321 10*3/uL (ref 150–400)
RBC: 4.74 MIL/uL (ref 4.22–5.81)
RDW: 15.2 % (ref 11.5–15.5)
WBC: 14.6 10*3/uL — AB (ref 4.0–10.5)

## 2015-05-31 LAB — SALICYLATE LEVEL

## 2015-05-31 LAB — ETHANOL: Alcohol, Ethyl (B): <5 mg/dL (ref <5)

## 2015-05-31 LAB — ACETAMINOPHEN LEVEL

## 2015-05-31 MED ORDER — VITAMIN B-12 1000 MCG PO TABS
1000.0000 ug | ORAL_TABLET | Freq: Every day | ORAL | Status: DC
  Filled 2015-05-31: qty 1

## 2015-05-31 MED ORDER — NICOTINE 21 MG/24HR TD PT24
21.0000 mg | MEDICATED_PATCH | Freq: Every day | TRANSDERMAL | Status: DC
  Administered 2015-06-01 – 2015-06-09 (×9): 21 mg via TRANSDERMAL
  Filled 2015-05-31 (×12): qty 1

## 2015-05-31 MED ORDER — PREDNISONE 20 MG PO TABS: 20.0000 mg | ORAL_TABLET | Freq: Every day | ORAL | Status: DC

## 2015-05-31 MED ORDER — TRAZODONE HCL 50 MG PO TABS: 50.0000 mg | ORAL_TABLET | Freq: Every evening | ORAL | Status: DC | PRN

## 2015-05-31 MED ORDER — AZATHIOPRINE 50 MG PO TABS
175.0000 mg | ORAL_TABLET | Freq: Every day | ORAL | Status: DC
  Filled 2015-05-31: qty 4

## 2015-05-31 MED ORDER — PRAZOSIN HCL 1 MG PO CAPS
1.0000 mg | ORAL_CAPSULE | Freq: Every day | ORAL | Status: DC
  Administered 2015-05-31: 1 mg via ORAL
  Filled 2015-05-31 (×3): qty 1

## 2015-05-31 MED ORDER — AZATHIOPRINE 50 MG PO TABS
175.0000 mg | ORAL_TABLET | Freq: Every day | ORAL | Status: DC
  Administered 2015-05-31 – 2015-06-09 (×10): 175 mg via ORAL
  Filled 2015-05-31 (×12): qty 4

## 2015-05-31 MED ORDER — OXYCODONE HCL 5 MG PO TABS
5.0000 mg | ORAL_TABLET | ORAL | Status: DC | PRN
  Administered 2015-05-31 – 2015-06-09 (×42): 5 mg via ORAL
  Filled 2015-05-31 (×42): qty 1

## 2015-05-31 MED ORDER — AMITRIPTYLINE HCL 50 MG PO TABS
50.0000 mg | ORAL_TABLET | Freq: Every day | ORAL | Status: DC
  Filled 2015-05-31: qty 1

## 2015-05-31 MED ORDER — ALPRAZOLAM 0.5 MG PO TABS
0.5000 mg | ORAL_TABLET | Freq: Two times a day (BID) | ORAL | Status: DC
  Administered 2015-05-31 – 2015-06-04 (×8): 0.5 mg via ORAL
  Filled 2015-05-31 (×8): qty 1

## 2015-05-31 MED ORDER — ACETAMINOPHEN 325 MG PO TABS: 650.0000 mg | ORAL_TABLET | Freq: Four times a day (QID) | ORAL | Status: DC | PRN

## 2015-05-31 MED ORDER — ONDANSETRON 4 MG PO TBDP
8.0000 mg | ORAL_TABLET | Freq: Three times a day (TID) | ORAL | Status: DC | PRN
  Administered 2015-06-02 – 2015-06-08 (×6): 8 mg via ORAL
  Filled 2015-05-31 (×6): qty 2

## 2015-05-31 MED ORDER — ONDANSETRON HCL 8 MG PO TABS: 4.0000 mg | ORAL_TABLET | Freq: Three times a day (TID) | ORAL | Status: DC | PRN

## 2015-05-31 MED ORDER — GABAPENTIN 400 MG PO CAPS
800.0000 mg | ORAL_CAPSULE | Freq: Three times a day (TID) | ORAL | Status: DC
  Administered 2015-05-31 – 2015-06-09 (×27): 800 mg via ORAL
  Filled 2015-05-31 (×36): qty 2

## 2015-05-31 MED ORDER — MAGNESIUM OXIDE 400 (241.3 MG) MG PO TABS
400.0000 mg | ORAL_TABLET | Freq: Every day | ORAL | Status: DC
  Filled 2015-05-31: qty 1

## 2015-05-31 MED ORDER — OXYCODONE-ACETAMINOPHEN 5-325 MG PO TABS
2.0000 | ORAL_TABLET | ORAL | Status: DC | PRN
  Administered 2015-05-31: 2 via ORAL

## 2015-05-31 MED ORDER — FERROUS SULFATE 325 (65 FE) MG PO TABS
325.0000 mg | ORAL_TABLET | Freq: Two times a day (BID) | ORAL | Status: DC
  Filled 2015-05-31: qty 1

## 2015-05-31 MED ORDER — OXYCODONE-ACETAMINOPHEN 5-325 MG PO TABS
ORAL_TABLET | ORAL | Status: AC
  Administered 2015-05-31: 2 via ORAL
  Filled 2015-05-31: qty 2

## 2015-05-31 MED ORDER — OXYCODONE-ACETAMINOPHEN 5-325 MG PO TABS
1.0000 | ORAL_TABLET | ORAL | Status: DC | PRN
  Administered 2015-05-31 – 2015-06-09 (×42): 1 via ORAL
  Filled 2015-05-31 (×40): qty 1

## 2015-05-31 MED ORDER — OXYCODONE-ACETAMINOPHEN 10-325 MG PO TABS: 1.0000 | ORAL_TABLET | ORAL | Status: DC | PRN

## 2015-05-31 MED ORDER — ALUM & MAG HYDROXIDE-SIMETH 200-200-20 MG/5ML PO SUSP: 30.0000 mL | ORAL | Status: DC | PRN

## 2015-05-31 MED ORDER — AMITRIPTYLINE HCL 50 MG PO TABS
50.0000 mg | ORAL_TABLET | Freq: Every day | ORAL | Status: DC
  Administered 2015-05-31: 50 mg via ORAL
  Filled 2015-05-31: qty 2
  Filled 2015-05-31 (×2): qty 1

## 2015-05-31 MED ORDER — ZOLPIDEM TARTRATE 5 MG PO TABS
5.0000 mg | ORAL_TABLET | Freq: Every evening | ORAL | Status: DC | PRN
  Filled 2015-05-31: qty 1

## 2015-05-31 MED ORDER — ACETAMINOPHEN 325 MG PO TABS: 650.0000 mg | ORAL_TABLET | ORAL | Status: DC | PRN

## 2015-05-31 MED ORDER — MAGNESIUM HYDROXIDE 400 MG/5ML PO SUSP: 30.0000 mL | Freq: Every day | ORAL | Status: DC | PRN

## 2015-05-31 MED ORDER — PREDNISONE 20 MG PO TABS
20.0000 mg | ORAL_TABLET | Freq: Every day | ORAL | Status: DC
  Administered 2015-06-01 – 2015-06-09 (×9): 20 mg via ORAL
  Filled 2015-05-31 (×12): qty 1

## 2015-05-31 MED ORDER — LOPERAMIDE HCL 2 MG PO CAPS
2.0000 mg | ORAL_CAPSULE | ORAL | Status: DC | PRN
  Administered 2015-06-02 – 2015-06-08 (×7): 2 mg via ORAL
  Filled 2015-05-31 (×8): qty 1

## 2015-05-31 MED ORDER — ALPRAZOLAM 0.5 MG PO TABS
0.5000 mg | ORAL_TABLET | Freq: Two times a day (BID) | ORAL | Status: DC
  Administered 2015-05-31: 0.5 mg via ORAL
  Filled 2015-05-31: qty 1

## 2015-05-31 MED ORDER — GABAPENTIN 800 MG PO TABS
800.0000 mg | ORAL_TABLET | Freq: Three times a day (TID) | ORAL | Status: DC
  Filled 2015-05-31: qty 1

## 2015-05-31 NOTE — Tx Team (Signed)
Initial Interdisciplinary Treatment Plan   PATIENT STRESSORS: Health problems Limited support   PATIENT STRENGTHS: Ability for insight Average or above average intelligence Capable of independent living Communication skills General fund of knowledge Motivation for treatment/growth   PROBLEM LIST: Problem List/Patient Goals Date to be addressed Date deferred Reason deferred Estimated date of resolution  "I hope to get an official diagnosis." 05/31/15           "To get the appropriate medicine for the condition." 05/31/15           Depression 05/31/15     SI to OD 05/31/15                        DISCHARGE CRITERIA:  Improved stabilization in mood, thinking, and/or behavior Need for constant or close observation no longer present Reduction of life-threatening or endangering symptoms to within safe limits Verbal commitment to aftercare and medication compliance  PRELIMINARY DISCHARGE PLAN: Attend aftercare/continuing care group Outpatient therapy Return to previous living arrangement  PATIENT/FAMIILY INVOLVEMENT: This treatment plan has been presented to and reviewed with the patient, Andre [name], and/or family member.  The patient and family have been given the opportunity to ask questions and make suggestions.  [name] [facility] 05/31/2015, 6:13 PM
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
## 2015-05-31 NOTE — ED Notes (Addendum)
Pt offered TV dinner or PB crackers, etc. Pt refused any food at all, drinking fluids.

## 2015-05-31 NOTE — ED Notes (Signed)
Sitter requested from [employer]

## 2015-05-31 NOTE — Progress Notes (Signed)
Patient ID: Andre [name], male   DOB: 1985-06-10, 30 y.o.   MRN: [id number] Per State regulations 482.30 this chart was reviewed for medical necessity with respect to the patient's admission/duration of stay.    Next review date: 06/04/15  Thurman Coyer, BSN, RN-BC  Case Manager

## 2015-05-31 NOTE — ED Notes (Signed)
Faxed Voluntary Admission consent to [facility]

## 2015-05-31 NOTE — ED Notes (Signed)
[facility] here at this time to transport patient.
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
## 2015-05-31 NOTE — Progress Notes (Signed)
Adult Psychoeducational Group Note  Date:  05/31/2015 Time:  9:05 PM  Group Topic/Focus:  Wrap-Up Group:   The focus of this group is to help patients review their daily goal of treatment and discuss progress on daily workbooks.  Participation Level:  Active  Participation Quality:  Appropriate and Attentive  Affect:  Appropriate  Cognitive:  Appropriate  Insight: Appropriate  Engagement in Group:  Engaged  Modes of Intervention:  Discussion  Additional Comments:  Pt stated he had an interesting day. Pt stated his goal while he is here is to get an official diagnosis.  Caswell Corwin 05/31/2015, 9:05 PM

## 2015-05-31 NOTE — ED Notes (Signed)
Report received from Julie RN

## 2015-05-31 NOTE — ED Notes (Signed)
Pt with suicidal thoughts since last pm, planned on overdosing on medications, has history of psych problems, recently treated for chrons disease

## 2015-05-31 NOTE — ED Notes (Signed)
Report called to Judithann Sauger at [facility]

## 2015-05-31 NOTE — ED Notes (Signed)
Waiting on [facility] to transport to [facility]

## 2015-05-31 NOTE — ED Provider Notes (Signed)
CSN: [id number]     Arrival date & time 05/31/15  0955 History   First MD Initiated Contact with Patient 05/31/15 1014     Chief Complaint  Patient presents with  . Suicidal     (Consider location/radiation/quality/duration/timing/severity/associated sxs/prior Treatment) HPI Patient presents to the emergency department with suicidal thoughts and ideations.  The patient states that he was contemplating suicide, but taking an overdose of pills.  The patient states that he came here last night, but the wait was too long, so he decided to go home and come back this morning.  The patient states that nothing seems make the condition better or worse.  Patient states that he did not take any medications in excess last night.The patient denies chest pain, shortness of breath, headache,blurred vision, neck pain, fever, cough, weakness, numbness, dizziness, anorexia, edema, abdominal pain, nausea, vomiting, diarrhea, rash, back pain, dysuria, hematemesis, bloody stool, near syncope, hallucinations, or syncope. Past Medical History  Diagnosis Date  . Anxiety   . Bipolar depression (HCC)   . Crohn disease (HCC) 1997  . Anemia     Around 2013 required PRBC transfusions. Has received parenteral iron in past. Has taken oral iron in past.   Past Surgical History  Procedure Laterality Date  . Subtotal colectomy      Has undergone a total of 3 separate bowel resections including terminal ileal resection and the equivalent of subtotal colectomy. Last surgery was in 2010.  Marland Kitchen Flexible sigmoidoscopy N/A 10/29/2014    Procedure: FLEXIBLE SIGMOIDOSCOPY;  Surgeon: Ruffin Frederick, MD;  Location: [facility] ENDOSCOPY;  Service: Gastroenterology;  Laterality: N/A;   Family History  Problem Relation Age of Onset  . Hypertension Father    Social History  Substance Use Topics  . Smoking status: Current Every Day Smoker -- 0.50 packs/day for 5 years  . Smokeless tobacco: Never Used  . Alcohol Use: No    Review of  Systems All other systems negative except as documented in the HPI. All pertinent positives and negatives as reviewed in the HPI.   Allergies  Lithium; Remicade; Compazine; Ketorolac; Morphine; Nsaids; Ciprofloxacin; Haldol; and Metronidazole  Home Medications   Prior to Admission medications   Medication Sig Start Date End Date Taking? Authorizing Provider  ALPRAZolam Prudy Feeler) 0.5 MG tablet Take 0.5 mg by mouth 2 (two) times daily.   Yes Historical Provider, MD  amitriptyline (ELAVIL) 50 MG tablet Take 1 tablet (50 mg total) by mouth at bedtime. 05/29/15  Yes Renae Fickle, MD  azaTHIOprine (IMURAN) 50 MG tablet Take 175 mg by mouth daily.   Yes Historical Provider, MD  ferrous sulfate 325 (65 FE) MG tablet Take 1 tablet (325 mg total) by mouth 2 (two) times daily with a meal. 05/29/15  Yes Renae Fickle, MD  gabapentin (NEURONTIN) 800 MG tablet Take 800 mg by mouth 3 (three) times daily.   Yes Historical Provider, MD  magnesium oxide (MAG-OX) 400 (241.3 Mg) MG tablet Take 1 tablet (400 mg total) by mouth daily. 05/29/15  Yes Renae Fickle, MD  ondansetron (ZOFRAN ODT) 4 MG disintegrating tablet Take 1 tablet (4 mg total) by mouth every 8 (eight) hours as needed for nausea or vomiting. 11/02/14  Yes Renae Fickle, MD  oxyCODONE-acetaminophen (PERCOCET) 10-325 MG tablet Take 1 tablet by mouth every 4 (four) hours as needed for pain. 05/29/15  Yes Renae Fickle, MD  predniSONE (DELTASONE) 20 MG tablet Take 1 tablet (20 mg total) by mouth daily with breakfast. 05/29/15  Yes Renae Fickle,  MD  vitamin B-12 1000 MCG tablet Take 1 tablet (1,000 mcg total) by mouth daily. 05/29/15  Yes Renae Fickle, MD   BP 126/99 mmHg  Pulse 128  Temp(Src) 98.3 F (36.8 C) (Oral)  Resp 20  Ht 5\' 2"  (1.575 m)  Wt 74.39 kg  BMI 29.99 kg/m2  SpO2 98% Physical Exam  Constitutional: He is oriented to person, place, and time. He appears well-developed and well-nourished. No distress.  HENT:  Head:  Normocephalic and atraumatic.  Mouth/Throat: Oropharynx is clear and moist.  Eyes: Pupils are equal, round, and reactive to light.  Neck: Normal range of motion. Neck supple.  Cardiovascular: Normal rate, regular rhythm and normal heart sounds.  Exam reveals no gallop and no friction rub.   No murmur heard. Pulmonary/Chest: Effort normal and breath sounds normal. No respiratory distress. He has no wheezes.  Abdominal: Soft. Bowel sounds are normal. He exhibits no distension. There is no tenderness.  Neurological: He is alert and oriented to person, place, and time. He exhibits normal muscle tone. Coordination normal.  Skin: Skin is warm and dry. No rash noted. No erythema.  Psychiatric: He has a normal mood and affect. His speech is normal and behavior is normal. Judgment normal. Thought content is not paranoid and not delusional. Cognition and memory are normal. He expresses suicidal ideation. He expresses no homicidal ideation. He expresses no homicidal plans.  Nursing note and vitals reviewed.   ED Course  Procedures (including critical care time) Labs Review Labs Reviewed  COMPREHENSIVE METABOLIC PANEL  ETHANOL  SALICYLATE LEVEL  ACETAMINOPHEN LEVEL  CBC  URINE RAPID DRUG SCREEN, HOSP PERFORMED    Imaging Review No results found. I have personally reviewed and evaluated these images and lab results as part of my medical decision-making.   EKG Interpretation   Date/Time:  Sunday May 31 2015 10:16:58 EDT Ventricular Rate:  123 PR Interval:  118 QRS Duration: 90 QT Interval:  324 QTC Calculation: 463 R Axis:   57 Text Interpretation:  Sinus tachycardia Biatrial enlargement Possible  Lateral infarct , age undetermined Abnormal ECG rate faster Confirmed by  Manus Gunning  MD, STEPHEN [phone]) on 05/31/2015 10:26:20 AM      The patient will need TTS assessment for further evaluation of his suicidal thoughts and plan.  The patient is given the course here in the emergency  department and voices an understanding.  Charlestine Night, PA-C 05/31/15 1038  Glynn Octave, MD 05/31/15 1736

## 2015-06-01 ENCOUNTER — Encounter (HOSPITAL_COMMUNITY): Payer: Self-pay | Admitting: Emergency Medicine

## 2015-06-01 DIAGNOSIS — R45851 Suicidal ideations: Secondary | ICD-10-CM

## 2015-06-01 DIAGNOSIS — F17200 Nicotine dependence, unspecified, uncomplicated: Secondary | ICD-10-CM | POA: Diagnosis not present

## 2015-06-01 DIAGNOSIS — D72829 Elevated white blood cell count, unspecified: Secondary | ICD-10-CM | POA: Diagnosis not present

## 2015-06-01 DIAGNOSIS — G4700 Insomnia, unspecified: Secondary | ICD-10-CM | POA: Diagnosis not present

## 2015-06-01 DIAGNOSIS — F332 Major depressive disorder, recurrent severe without psychotic features: Secondary | ICD-10-CM

## 2015-06-01 DIAGNOSIS — F319 Bipolar disorder, unspecified: Secondary | ICD-10-CM | POA: Diagnosis present

## 2015-06-01 DIAGNOSIS — F4310 Post-traumatic stress disorder, unspecified: Secondary | ICD-10-CM | POA: Diagnosis not present

## 2015-06-01 DIAGNOSIS — F419 Anxiety disorder, unspecified: Secondary | ICD-10-CM | POA: Diagnosis not present

## 2015-06-01 DIAGNOSIS — I959 Hypotension, unspecified: Secondary | ICD-10-CM | POA: Diagnosis not present

## 2015-06-01 DIAGNOSIS — Z8249 Family history of ischemic heart disease and other diseases of the circulatory system: Secondary | ICD-10-CM | POA: Diagnosis not present

## 2015-06-01 DIAGNOSIS — Z818 Family history of other mental and behavioral disorders: Secondary | ICD-10-CM | POA: Diagnosis not present

## 2015-06-01 LAB — CBC
HCT: 39.9 % (ref 39.0–52.0)
HEMOGLOBIN: 13.1 g/dL (ref 13.0–17.0)
MCH: 27.2 pg (ref 26.0–34.0)
MCHC: 32.8 g/dL (ref 30.0–36.0)
MCV: 83 fL (ref 78.0–100.0)
PLATELETS: 327 10*3/uL (ref 150–400)
RBC: 4.81 MIL/uL (ref 4.22–5.81)
RDW: 15.2 % (ref 11.5–15.5)
WBC: 12.4 10*3/uL — AB (ref 4.0–10.5)

## 2015-06-01 LAB — BASIC METABOLIC PANEL
ANION GAP: 10 (ref 5–15)
BUN: 20 mg/dL (ref 6–20)
CHLORIDE: 104 mmol/L (ref 101–111)
CO2: 20 mmol/L — ABNORMAL LOW (ref 22–32)
Calcium: 8.6 mg/dL — ABNORMAL LOW (ref 8.9–10.3)
Creatinine, Ser: 1.04 mg/dL (ref 0.61–1.24)
GFR calc Af Amer: >60 mL/min (ref >60)
GLUCOSE: 126 mg/dL — AB (ref 65–99)
POTASSIUM: 4.2 mmol/L (ref 3.5–5.1)
Sodium: 134 mmol/L — ABNORMAL LOW (ref 135–145)

## 2015-06-01 MED ORDER — SODIUM CHLORIDE 0.9 % IV BOLUS (SEPSIS)
1000.0000 mL | Freq: Once | INTRAVENOUS | Status: AC
  Administered 2015-06-01: 1000 mL via INTRAVENOUS

## 2015-06-01 MED ORDER — LAMOTRIGINE 25 MG PO TABS
25.0000 mg | ORAL_TABLET | Freq: Every day | ORAL | Status: DC
  Administered 2015-06-01 – 2015-06-07 (×7): 25 mg via ORAL
  Filled 2015-06-01 (×10): qty 1

## 2015-06-01 MED ORDER — FENTANYL CITRATE (PF) 100 MCG/2ML IJ SOLN
12.5000 ug | Freq: Once | INTRAMUSCULAR | Status: AC
  Administered 2015-06-01: 12.5 ug via INTRAVENOUS
  Filled 2015-06-01: qty 2

## 2015-06-01 MED ORDER — POTASSIUM CHLORIDE CRYS ER 10 MEQ PO TBCR
10.0000 meq | EXTENDED_RELEASE_TABLET | Freq: Two times a day (BID) | ORAL | Status: AC
  Administered 2015-06-01 – 2015-06-03 (×4): 10 meq via ORAL
  Filled 2015-06-01 (×5): qty 1

## 2015-06-01 MED ORDER — METOCLOPRAMIDE HCL 5 MG/ML IJ SOLN
10.0000 mg | Freq: Once | INTRAMUSCULAR | Status: AC
  Administered 2015-06-01: 10 mg via INTRAVENOUS
  Filled 2015-06-01: qty 2

## 2015-06-01 NOTE — ED Notes (Addendum)
Pt agrees to lab draw if receives pain medications. Main lab phlebotomy called and repots en route; ETA 15 minutes. Pt agrees to have labs drawn then medications administered. Ginger ale given per pt request.

## 2015-06-01 NOTE — [facility] Group Notes (Signed)
[facility] LCSW Aftercare Discharge Planning Group Note  06/01/2015 8:45 AM  Pt did not attend, declined invitation.   Reighan Hipolito Carter, LCSWA 06/01/2015 9:21 AM  

## 2015-06-01 NOTE — Tx Team (Signed)
Interdisciplinary Treatment Plan Update (Adult) Date: 06/01/2015   Date: 06/01/2015 10:50 AM  Progress in Treatment:  Attending groups: Pt is new to milieu, continuing to assess  Participating in groups: Pt is new to milieu, continuing to assess  Taking medication as prescribed: Yes  Tolerating medication: Yes  Family/Significant othe contact made: No, CSW assessing for appropriate contact Patient understands diagnosis: Yes AEB seeking help with depression Discussing patient identified problems/goals with staff: Yes  Medical problems stabilized or resolved: Yes  Denies suicidal/homicidal ideation: No, endorses passive SI Patient has not harmed self or Others: Yes   New problem(s) identified: None identified at this time.   Discharge Plan or Barriers: Pt will return home and follow-up with outpatient providers  Additional comments:  Patient and CSW reviewed pt's identified goals and treatment plan. Patient verbalized understanding and agreed to treatment plan. CSW reviewed [facility] "Discharge Process and Patient Involvement" Form. Pt verbalized understanding of information provided and signed form.   Reason for Continuation of Hospitalization:  Anxiety Depression Medication stabilization Suicidal ideation  Estimated length of stay: 3-5 days  Review of initial/current patient goals per problem list:   1.  Goal(s): Patient will participate in aftercare plan  Met:  Yes  Target date: 3-5 days from date of admission   As evidenced by: Patient will participate within aftercare plan AEB aftercare provider and housing plan at discharge being identified.   06/01/15: Pt will return home and follow-up with outpatient providers  2.  Goal (s): Patient will exhibit decreased depressive symptoms and suicidal ideations.  Met:  No  Target date: 3-5 days from date of admission   As evidenced by: Patient will utilize self rating of depression at 3 or below and demonstrate decreased signs of  depression or be deemed stable for discharge by MD. 06/01/15: Pt was admitted with symptoms of depression, rating 10/10. Pt continues to present with flat affect and depressive symptoms.  Pt will demonstrate decreased symptoms of depression and rate depression at 3/10 or lower prior to discharge.   3.  Goal(s): Patient will demonstrate decreased signs and symptoms of anxiety.  Met:  No  Target date: 3-5 days from date of admission   As evidenced by: Patient will utilize self rating of anxiety at 3 or below and demonstrated decreased signs of anxiety, or be deemed stable for discharge by MD 06/01/15: Pt was admitted with increased levels of anxiety and is currently rating those symptoms highly. Pt will demonstrated decreased symptoms of anxiety and rate it at 3/10 prior to d/c.  Attendees:  Patient:    Family:    Physician: Dr. Parke Poisson, MD  06/01/2015 10:50 AM  Nursing:  06/01/2015 10:50 AM  Clinical Social Worker Peri Maris, [place] 06/01/2015 10:50 AM  Other: Erasmo Downer Drinkard, LCSWA 06/01/2015 10:50 AM  Clinical:  Eulogio Bear, RN; Grayland Ormond, RN 06/01/2015 10:50 AM  Other: , RN Charge Nurse 06/01/2015 10:50 AM  Other:     Peri Maris, [place] Social Work [phone]
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
## 2015-06-01 NOTE — [facility] Group Notes (Signed)
[facility] LCSW Group Therapy  06/01/2015 1:15pm  Type of Therapy:  Group Therapy vercoming Obstacles  Pt did not attend, declined invitation.   Chad Cordial, LCSWA 06/01/2015 5:32 PM

## 2015-06-01 NOTE — ED Notes (Signed)
Pt denies active SI at present time.

## 2015-06-01 NOTE — Progress Notes (Signed)
Pt reports he has recently moved to [place] a few weeks ago  Pt provided with a list medicare internal medicine and psychiatry providers within a 50 mile radius of [phone]  Pt appreciative of resources provided

## 2015-06-01 NOTE — Discharge Instructions (Signed)
Patient is medically cleared at this time.

## 2015-06-01 NOTE — ED Notes (Signed)
Per [facility] pt transferred to ED for positive orthostatic changes. Hx of Crohn and chronic diarrhea.

## 2015-06-01 NOTE — ED Notes (Signed)
Per [facility] await lab results and administer medications as ordered.
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
## 2015-06-01 NOTE — Progress Notes (Signed)
Pt transferred to ED for further evaluation, report given to charge nurse, [place] called for transpotation.

## 2015-06-01 NOTE — ED Notes (Signed)
Pt refused labs -  RN aware.  

## 2015-06-01 NOTE — ED Notes (Signed)
Attempt blood draw to left hand unsuccessful; pt agrees to have labs drawn by phlebotomy.
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
## 2015-06-01 NOTE — Progress Notes (Signed)
Pt is back to the unit from [facility], pt ambulatory and stable, complained of left abdominal pain, medication given as scheduled, pt went for dinner, no further complains, will continue to monitor.

## 2015-06-01 NOTE — Progress Notes (Signed)
Adult Psychoeducational Group Note  Date:  06/01/2015 Time:  9:32 PM  Group Topic/Focus:  Wrap-Up Group:   The focus of this group is to help patients review their daily goal of treatment and discuss progress on daily workbooks.  Participation Level:  Active  Participation Quality:  Appropriate  Affect:  Appropriate  Cognitive:  Appropriate  Insight: Appropriate  Engagement in Group:  Engaged  Modes of Intervention:  Socialization and Support  Additional Comments:  Patient attended and participated in group tonight. He reports that his day was interested. He went to the ER today due to his blood pressure being too high. Now he is medically clear  Scot Dock 06/01/2015, 9:32 PM
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
## 2015-06-01 NOTE — ED Notes (Addendum)
Main phlebotomy in room

## 2015-06-01 NOTE — ED Notes (Signed)
REFUSED BLOOD DRAW. WAIT NG FOR RN TO DRAW

## 2015-06-01 NOTE — ED Notes (Signed)
Bed: WU98 Expected date:  Expected time:  Means of arrival:  Comments: [facility] pt/dehydration/hypotensive
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
## 2015-06-01 NOTE — ED Notes (Signed)
Spoke with [facility] adult unit to report and [facility] called per Diplomatic Services operational officer Clydie Braun.

## 2015-06-01 NOTE — Progress Notes (Signed)
Pt's VS were 95/64, 133 and 81/56, 138 standing. On call extender was notified. No additional care needed at this time. Pt denies any signs or symptoms.

## 2015-06-02 DIAGNOSIS — F319 Bipolar disorder, unspecified: Secondary | ICD-10-CM | POA: Diagnosis not present

## 2015-06-02 DIAGNOSIS — F332 Major depressive disorder, recurrent severe without psychotic features: Secondary | ICD-10-CM | POA: Diagnosis not present

## 2015-06-02 LAB — BASIC METABOLIC PANEL
ANION GAP: 12 (ref 5–15)
BUN: 22 mg/dL — ABNORMAL HIGH (ref 6–20)
CALCIUM: 9 mg/dL (ref 8.9–10.3)
CO2: 17 mmol/L — ABNORMAL LOW (ref 22–32)
CREATININE: 0.92 mg/dL (ref 0.61–1.24)
Chloride: 109 mmol/L (ref 101–111)
Glucose, Bld: 82 mg/dL (ref 65–99)
Potassium: 4.6 mmol/L (ref 3.5–5.1)
SODIUM: 138 mmol/L (ref 135–145)

## 2015-06-02 NOTE — Progress Notes (Signed)
DAR NOTE: Patient presents with flat affect and depressed mood.  Denies auditory and visual hallucinations.  reports fair to good night sleep.  Maintained on routine safety checks.  Medications given as prescribed.  Support and encouragement offered as needed.  Attended group and participated.   Patient observed socializing with peers in the dayroom.  Patient requested and received Imodium for diarrhea and Oxycodone for abdominal pain with good effect.

## 2015-06-02 NOTE — Progress Notes (Signed)
Recreation Therapy Notes  Animal-Assisted Activity (AAA) Program Checklist/Progress Notes Patient Eligibility Criteria Checklist & Daily Group note for Rec Tx Intervention  Date: 04.12.2017 Time: 2:45pm Location: [address] Dayroom   AAA/T Program Assumption of Risk Form signed by Patient/ or Parent Legal Guardian Yes  Patient is free of allergies or sever asthma Yes  Patient reports no fear of animals Yes  Patient reports no history of cruelty to animals Yes  Patient understands his/her participation is voluntary Yes  Patient washes hands before animal contact Yes  Patient washes hands after animal contact Yes  Behavioral Response: Appropriate   Education: Hand Washing, Appropriate Animal Interaction   Education Outcome: Acknowledges education.   Clinical Observations/Feedback: Patient interacted appropriately with therapy dog and peers during session.    Andre [name] L Andre [name], LRT/CTRS        Alonnah Lampkins L 06/02/2015 3:07 PM 

## 2015-06-02 NOTE — BHH Group Notes (Signed)
Adult Psychoeducational Group Note  Date:  06/02/2015 Time:  9:22 PM  Group Topic/Focus:  Wrap-Up Group:   The focus of this group is to help patients review their daily goal of treatment and discuss progress on daily workbooks.  Participation Level:  Active  Participation Quality:  Appropriate  Affect:  Appropriate  Cognitive:  Appropriate  Insight: Good  Engagement in Group:  Engaged  Modes of Intervention:  Discussion  Additional Comments:  Pt stated physically is day was a 3 and mentally it was an 8.  Pt stated his Chrone's had him not feeling well, but he cheered up being around everyone else.  It also helped get his mind off of the Chrone's.  Caroll Rancher A 06/02/2015, 9:22 PM

## 2015-06-02 NOTE — BHH Group Notes (Signed)
BHH LCSW Group Therapy 06/02/2015 1:15 PM  Type of Therapy: Group Therapy- Feelings about Diagnosis  Participation Level: Active   Participation Quality:  Appropriate  Affect:  Appropriate  Cognitive: Alert and Oriented   Insight:  Developing   Engagement in Therapy: Developing/Improving and Engaged   Modes of Intervention: Clarification, Confrontation, Discussion, Education, Exploration, Limit-setting, Orientation, Problem-solving, Rapport Building, Dance movement psychotherapist, Socialization and Support  Description of Group:   This group will allow patients to explore their thoughts and feelings about diagnoses they have received. Patients will be guided to explore their level of understanding and acceptance of these diagnoses. Facilitator will encourage patients to process their thoughts and feelings about the reactions of others to their diagnosis, and will guide patients in identifying ways to discuss their diagnosis with significant others in their lives. This group will be process-oriented, with patients participating in exploration of their own experiences as well as giving and receiving support and challenge from other group members.  Summary of Progress/Problems:  Pt participated actively in group discussion. He interacts well with peers and is receptive to feedback. Pt agreed with other patients that dealing with the stigma related to mental illness is a challenge. However, he was able to identify only resources and supports that he has found helpful. Pt appears to be invested in treatment AEB inquiring about aspects of diagnosis and symptomology.  Therapeutic Modalities:   Cognitive Behavioral Therapy Solution Focused Therapy Motivational Interviewing Relapse Prevention Therapy  Chad Cordial, LCSWA 06/02/2015 5:16 PM

## 2015-06-02 NOTE — Progress Notes (Signed)
D: When asked about his day pt stated, "It has really been a day". Writer informed pt that she'd heard he was sent to the ER.Pt stated, "they gave me IV fluids."  Pt shook his head a couple times before walking away. Pt has no questions or concerns.   A:  Support and encouragement was offered. 15 min checks continued for safety.  R: Pt remains safe.

## 2015-06-02 NOTE — BHH Group Notes (Signed)

## 2015-06-02 NOTE — Progress Notes (Signed)
[facility] MD Progress Note  06/02/2015 4:03 PM Andre [name]  MRN:  [id number] Subjective: Patient states he continues to feel depressed, sad, but today states he is feeling better, which he attributes at least partially to feeling better hydrated after recent IV bolus in ED . Objective : I have discussed case with treatment team and have met with patient . Patient remains depressed, anxious, but as noted, reports some improvement compared to admission , and physically feels better . Attributes his depression in significant part to his severe Chron's disease, and how this illness has affected his ability to function. Recent relocation from [place] to [place] has contributed to stress and possibly to increased Chron's symptoms as well. Reports diarrhea, abdominal discomfort, but denies melenas, lower GI bleed . Visible on unit, pleasant on approach, no disruptive or agitated behaviors . Principal Problem:  MDD  Diagnosis:   Patient Active Problem List   Diagnosis Date Noted  . Suicidal ideations [R45.851] 05/31/2015  . Exacerbation of Crohn's disease ([place]) [K50.90] 05/27/2015  . Hypokalemia [E87.6] 05/27/2015  . Crohn's disease of both small and large intestine without complication ([place]) [Q33.35]   . LLQ abdominal pain [R10.32]   . 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 Chronic back pain greater than 3 months duration [M54.9, G89.29] 11/17/2014  . Tobacco dependence [F17.200] 11/17/2014  . Abdominal pain [R10.9] 11/05/2014  . Metabolic acidosis, increased anion gap (IAG) [E87.2] 11/05/2014  . Crohn's colitis ([place]) [K50.10] 10/26/2014  . Acute hypokalemia [E87.6] 10/26/2014  . Acute hyperglycemia [R73.09] 10/26/2014  . Refractory nausea and vomiting [R11.2] 10/26/2014  . Dehydration, moderate [E86.0] 10/26/2014  . Microcytic anemia [D50.9] 10/26/2014  . Bipolar affective disorder ([place]) [F31.9] 10/26/2014  . Anxiety disorder [F41.9] 10/26/2014  . Acute Crohn's disease ([place]) [K50.90] 10/26/2014   Total Time  spent with patient: 20 minutes    Past Medical History:  Past Medical History  Diagnosis Date  . Anxiety   . Bipolar depression ([place])   . Crohn disease ([place]) 1997  . Anemia     Around 2013 required PRBC transfusions. Has received parenteral iron in past. Has taken oral iron in past.    Past Surgical History  Procedure Laterality Date  . Subtotal colectomy      Has undergone a total of 3 separate bowel resections including terminal ileal resection and the equivalent of subtotal colectomy. Last surgery was in 2010.  Marland Kitchen Flexible sigmoidoscopy N/A 10/29/2014    Procedure: FLEXIBLE SIGMOIDOSCOPY;  Surgeon: Manus Gunning, MD;  Location: [place];  Service: Gastroenterology;  Laterality: N/A;   Family History:  Family History  Problem Relation Age of Onset  . Hypertension Father     Social History:  History  Alcohol Use No     History  Drug Use No    Social History   Social History  . Marital Status: Single    Spouse Name: N/A  . Number of Children: N/A  . Years of Education: N/A   Social History Main Topics  . Smoking status: Current Every Day Smoker -- 0.50 packs/day for 5 years  . Smokeless tobacco: Never Used  . Alcohol Use: No  . Drug Use: No  . Sexual Activity: Not Currently   Other Topics Concern  . None   Social History Narrative   Additional Social History:   Sleep: Good  Appetite:  Good  Current Medications: Current Facility-Administered Medications  Medication Dose Route Frequency Provider Last Rate Last Dose  . acetaminophen (TYLENOL) tablet 650 mg  650 mg Oral Q6H PRN Nanci Pina, FNP      . ALPRAZolam Duanne Moron) tablet 0.5 mg  0.5 mg Oral BID Nicholaus Bloom, MD   0.5 mg at 06/02/15 1937  . azaTHIOprine (IMURAN) tablet 175 mg  175 mg Oral Q breakfast Nicholaus Bloom, MD   175 mg at 06/02/15 0820  . gabapentin (NEURONTIN) capsule 800 mg  800 mg Oral TID Nicholaus Bloom, MD   800 mg at 06/02/15 1144  . lamoTRIgine (LAMICTAL) tablet 25 mg  25  mg Oral Daily Jenne Campus, MD   25 mg at 06/02/15 0820  . loperamide (IMODIUM) capsule 2 mg  2 mg Oral PRN Nicholaus Bloom, MD   2 mg at 06/02/15 1144  . magnesium hydroxide (MILK OF MAGNESIA) suspension 30 mL  30 mL Oral Daily PRN Nanci Pina, FNP      . nicotine (NICODERM CQ - dosed in mg/24 hours) patch 21 mg  21 mg Transdermal Daily Jenne Campus, MD   21 mg at 06/02/15 0821  . ondansetron (ZOFRAN-ODT) disintegrating tablet 8 mg  8 mg Oral Q8H PRN Nicholaus Bloom, MD   8 mg at 06/02/15 1144  . oxyCODONE-acetaminophen (PERCOCET/ROXICET) 5-325 MG per tablet 1 tablet  1 tablet Oral Q4H PRN Nicholaus Bloom, MD   1 tablet at 06/02/15 1252   And  . oxyCODONE (Oxy IR/ROXICODONE) immediate release tablet 5 mg  5 mg Oral Q4H PRN Nicholaus Bloom, MD   5 mg at 06/02/15 1252  . potassium chloride (K-DUR,KLOR-CON) CR tablet 10 mEq  10 mEq Oral BID Jenne Campus, MD   10 mEq at 06/02/15 0820  . predniSONE (DELTASONE) tablet 20 mg  20 mg Oral Q breakfast Nicholaus Bloom, MD   20 mg at 06/02/15 9024  . traZODone (DESYREL) tablet 50 mg  50 mg Oral QHS PRN Nanci Pina, FNP        Lab Results:  Results for orders placed or performed during the hospital encounter of 05/31/15 (from the past 48 hour(s))  CBC     Status: Abnormal   Collection Time: 06/01/15  2:55 PM  Result Value Ref Range   WBC 12.4 (H) 4.0 - 10.5 K/uL   RBC 4.81 4.22 - 5.81 MIL/uL   Hemoglobin 13.1 13.0 - 17.0 g/dL   HCT 39.9 39.0 - 52.0 %   MCV 83.0 78.0 - 100.0 fL   MCH 27.2 26.0 - 34.0 pg   MCHC 32.8 30.0 - 36.0 g/dL   RDW 15.2 11.5 - 15.5 %   Platelets 327 150 - 400 K/uL  Basic metabolic panel     Status: Abnormal   Collection Time: 06/01/15  2:55 PM  Result Value Ref Range   Sodium 134 (L) 135 - 145 mmol/L   Potassium 4.2 3.5 - 5.1 mmol/L   Chloride 104 101 - 111 mmol/L   CO2 20 (L) 22 - 32 mmol/L   Glucose, Bld 126 (H) 65 - 99 mg/dL   BUN 20 6 - 20 mg/dL   Creatinine, Ser 1.04 0.61 - 1.24 mg/dL   Calcium 8.6 (L)  8.9 - 10.3 mg/dL   GFR calc non Af Amer >60 >60 mL/min   GFR calc Af Amer >60 >60 mL/min    Comment: (NOTE) The eGFR has been calculated using the CKD EPI equation. This calculation has not been validated in all clinical situations. eGFR's persistently <60 mL/min signify possible Chronic Kidney Disease.  Anion gap 10 5 - 15  Basic metabolic panel     Status: Abnormal   Collection Time: 06/02/15  6:14 AM  Result Value Ref Range   Sodium 138 135 - 145 mmol/L   Potassium 4.6 3.5 - 5.1 mmol/L   Chloride 109 101 - 111 mmol/L   CO2 17 (L) 22 - 32 mmol/L   Glucose, Bld 82 65 - 99 mg/dL   BUN 22 (H) 6 - 20 mg/dL   Creatinine, Ser 0.92 0.61 - 1.24 mg/dL   Calcium 9.0 8.9 - 10.3 mg/dL   GFR calc non Af Amer >60 >60 mL/min   GFR calc Af Amer >60 >60 mL/min    Comment: (NOTE) The eGFR has been calculated using the CKD EPI equation. This calculation has not been validated in all clinical situations. eGFR's persistently <60 mL/min signify possible Chronic Kidney Disease.    Anion gap 12 5 - 15    Comment: Performed at [facility]    Blood Alcohol level:  Lab Results  Component Value Date   [facility] <5 05/31/2015    Physical Findings: AIMS: Facial and Oral Movements Muscles of Facial Expression: None, normal Lips and Perioral Area: None, normal Jaw: None, normal Tongue: None, normal,Extremity Movements Upper (arms, wrists, hands, fingers): None, normal Lower (legs, knees, ankles, toes): None, normal, Trunk Movements Neck, shoulders, hips: None, normal, Overall Severity Severity of abnormal movements (highest score from questions above): None, normal Incapacitation due to abnormal movements: None, normal Patient's awareness of abnormal movements (rate only patient's report): No Awareness, Dental Status Current problems with teeth and/or dentures?: No Does patient usually wear dentures?: No  CIWA:    COWS:     Musculoskeletal: Strength & Muscle Tone: within  normal limits Gait & Station: normal Patient leans: N/A  Psychiatric Specialty Exam: ROS chronic diarrhea, chronic abdominal discomfort, chronic open wound on laparotomy scar- denies discharge , fever, or chills , denies lower GI bleeding at this time   Blood pressure 111/74, pulse 91, temperature 98.9 F (37.2 C), temperature source Oral, resp. rate 16, height 5' 2" (1.575 m), weight 153 lb (69.4 kg), SpO2 99 %.Body mass index is 27.98 kg/(m^2).  General Appearance: Fairly Groomed  Engineer, water::  Good  Speech:  Normal Rate  Volume:  Normal  Mood:  Depressed, but reports feeling better today   Affect:  constricted but reactive   Thought Process:  Linear  Orientation:  Full (Time, Place, and Person)  Thought Content:  no hallucinations, no delusions   Suicidal Thoughts:  No denies suicidal or self injurious ideations   Homicidal Thoughts:  No  Memory:  recent and remote grossly intact   Judgement:  Fair  Insight:  Present  Psychomotor Activity:  Normal  Concentration:  Good  Recall:  Good  Fund of Knowledge:Good  Language: Good  Akathisia:  Negative  Handed:  Right  AIMS (if indicated):     Assets:  Communication Skills Desire for Improvement Resilience  ADL's:  Intact  Cognition: WNL  Sleep:  Number of Hours: 6.5  Assessment - patient remains depressed, but today feeling physically better, likely associated with improved hydration following IV fluid bolus. Presents with improved range of affect, denies suicidal ideations . At this time tolerating Lamictal well ( patient reports history of Bipolar Disorder, and Lamictal has been started for Bipolar Depression ) .  Treatment Plan Summary: Daily contact with patient to assess and evaluate symptoms and progress in treatment, Medication management, Plan inpatient treatment  and medications as below  Encourage group and milieu participation to work on coping skills  And symptom reduction Continue Lamictal 25 mgrs QDAY for  depression Continue Neurontin 800 mgrs TID for anxiety, pain Continue Trazodone 50 mgrs QHS PRN for insomnia as needed  Continue management with Imuran and Prednisone for Chron's D Patient will need to establish outpatient GI specialist care as [place] and [place] not have local specialist yet.  Neita Garnet, MD 06/02/2015, 4:03 PM

## 2015-06-02 NOTE — Progress Notes (Signed)
DAR: Pt present with bright affect and happy mood. Pt has been in the milieu, attended group and was engaged. Pt stated he is feel much better today, " I want to keep on drinking fluid, I do not want to be in the situation I was in yesterday." His BP improved and is very pleased with that. Although pt stated he did not sleep well last due to being in pain, poor appetite, high energy and good concentration. Pt's goal for today is "intake." Pt's safety ensured with 15 minute and environmental checks. Pt currently denies SI/HI and A/V hallucinations. Pt verbally agrees to seek staff if SI/HI or A/VH occurs and to consult with staff before acting on these thoughts. Will continue POC.

## 2015-06-02 NOTE — Progress Notes (Signed)
Pt attended spiritual care group on grief and loss facilitated by chaplain Desere Gwin   Group opened with brief discussion and psycho-social ed around grief and loss in relationships and in relation to self - identifying life patterns, circumstances, changes that cause losses. Established group norm of speaking from own life experience. Group goal of establishing open and affirming space for members to share loss and experience with grief, normalize grief experience and provide psycho social education and grief support.     

## 2015-06-03 DIAGNOSIS — F332 Major depressive disorder, recurrent severe without psychotic features: Secondary | ICD-10-CM | POA: Diagnosis not present

## 2015-06-03 NOTE — Progress Notes (Signed)
Patient ID: Andre [name], male   DOB: 1986-01-24, 30 y.o.   MRN: [id number] [facility] MD Progress Note  06/03/2015 12:22 PM Andre [name]  MRN:  [id number] Subjective: Patient states he is doing " a little bit better", but states he feels " tired and run down", which he attributes partially to his depression but mostly to his chronic medical illness ( Chron's Disease ) . Denies medication side effects.  Objective : I have discussed case with treatment team and have met with patient . Patient reports ongoing depression, but does state he is feeling better. Main neuro-vegetative symptom reported is low energy level, which may be significantly related to chronic medical illness as well . At this time reports some passive suicidal ideations, such as thinking at times he would like to die, but  denies any active  suicidal ideations and contracts for safety on unit  Tolerating medications well , denies side effects.  Visible on unit, going to groups, no disruptive or agitated behaviors on unit  Principal Problem:  MDD  Diagnosis:   Patient Active Problem List   Diagnosis Date Noted  . Suicidal ideations [R45.851] 05/31/2015  . Exacerbation of Crohn's disease ([place]) [K50.90] 05/27/2015  . Hypokalemia [E87.6] 05/27/2015  . Crohn's disease of both small and large intestine without complication ([place]) [N82.95]   . LLQ abdominal pain [R10.32]   . 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 Chronic back pain greater than 3 months duration [M54.9, G89.29] 11/17/2014  . Tobacco dependence [F17.200] 11/17/2014  . Abdominal pain [R10.9] 11/05/2014  . Metabolic acidosis, increased anion gap (IAG) [E87.2] 11/05/2014  . Crohn's colitis ([place]) [K50.10] 10/26/2014  . Acute hypokalemia [E87.6] 10/26/2014  . Acute hyperglycemia [R73.09] 10/26/2014  . Refractory nausea and vomiting [R11.2] 10/26/2014  . Dehydration, moderate [E86.0] 10/26/2014  . Microcytic anemia [D50.9] 10/26/2014  . Bipolar affective disorder ([place]) [F31.9]  10/26/2014  . Anxiety disorder [F41.9] 10/26/2014  . Acute Crohn's disease ([place]) [K50.90] 10/26/2014   Total Time spent with patient: 20 minutes    Past Medical History:  Past Medical History  Diagnosis Date  . Anxiety   . Bipolar depression ([place])   . Crohn disease ([place]) 1997  . Anemia     Around 2013 required PRBC transfusions. Has received parenteral iron in past. Has taken oral iron in past.    Past Surgical History  Procedure Laterality Date  . Subtotal colectomy      Has undergone a total of 3 separate bowel resections including terminal ileal resection and the equivalent of subtotal colectomy. Last surgery was in 2010.  Marland Kitchen Flexible sigmoidoscopy N/A 10/29/2014    Procedure: FLEXIBLE SIGMOIDOSCOPY;  Surgeon: Manus Gunning, MD;  Location: [place];  Service: Gastroenterology;  Laterality: N/A;   Family History:  Family History  Problem Relation Age of Onset  . Hypertension Father     Social History:  History  Alcohol Use No     History  Drug Use No    Social History   Social History  . Marital Status: Single    Spouse Name: N/A  . Number of Children: N/A  . Years of Education: N/A   Social History Main Topics  . Smoking status: Current Every Day Smoker -- 0.50 packs/day for 5 years  . Smokeless tobacco: Never Used  . Alcohol Use: No  . Drug Use: No  . Sexual Activity: Not Currently   Other Topics Concern  . None   Social History Narrative   Additional Social History:  Sleep: Good  Appetite:  Fair   Current Medications: Current Facility-Administered Medications  Medication Dose Route Frequency Provider Last Rate Last Dose  . acetaminophen (TYLENOL) tablet 650 mg  650 mg Oral Q6H PRN Nanci Pina, FNP      . ALPRAZolam Duanne Moron) tablet 0.5 mg  0.5 mg Oral BID Nicholaus Bloom, MD   0.5 mg at 06/03/15 0841  . azaTHIOprine (IMURAN) tablet 175 mg  175 mg Oral Q breakfast Nicholaus Bloom, MD   175 mg at 06/03/15 0841  . gabapentin (NEURONTIN)  capsule 800 mg  800 mg Oral TID Nicholaus Bloom, MD   800 mg at 06/03/15 1203  . lamoTRIgine (LAMICTAL) tablet 25 mg  25 mg Oral Daily Jenne Campus, MD   25 mg at 06/03/15 0841  . loperamide (IMODIUM) capsule 2 mg  2 mg Oral PRN Nicholaus Bloom, MD   2 mg at 06/02/15 2111  . magnesium hydroxide (MILK OF MAGNESIA) suspension 30 mL  30 mL Oral Daily PRN Nanci Pina, FNP      . nicotine (NICODERM CQ - dosed in mg/24 hours) patch 21 mg  21 mg Transdermal Daily Jenne Campus, MD   21 mg at 06/03/15 0842  . ondansetron (ZOFRAN-ODT) disintegrating tablet 8 mg  8 mg Oral Q8H PRN Nicholaus Bloom, MD   8 mg at 06/02/15 1144  . oxyCODONE-acetaminophen (PERCOCET/ROXICET) 5-325 MG per tablet 1 tablet  1 tablet Oral Q4H PRN Nicholaus Bloom, MD   1 tablet at 06/03/15 1059   And  . oxyCODONE (Oxy IR/ROXICODONE) immediate release tablet 5 mg  5 mg Oral Q4H PRN Nicholaus Bloom, MD   5 mg at 06/03/15 1058  . predniSONE (DELTASONE) tablet 20 mg  20 mg Oral Q breakfast Nicholaus Bloom, MD   20 mg at 06/03/15 0841  . traZODone (DESYREL) tablet 50 mg  50 mg Oral QHS PRN Nanci Pina, FNP        Lab Results:  Results for orders placed or performed during the hospital encounter of 05/31/15 (from the past 48 hour(s))  CBC     Status: Abnormal   Collection Time: 06/01/15  2:55 PM  Result Value Ref Range   WBC 12.4 (H) 4.0 - 10.5 K/uL   RBC 4.81 4.22 - 5.81 MIL/uL   Hemoglobin 13.1 13.0 - 17.0 g/dL   HCT 39.9 39.0 - 52.0 %   MCV 83.0 78.0 - 100.0 fL   MCH 27.2 26.0 - 34.0 pg   MCHC 32.8 30.0 - 36.0 g/dL   RDW 15.2 11.5 - 15.5 %   Platelets 327 150 - 400 K/uL  Basic metabolic panel     Status: Abnormal   Collection Time: 06/01/15  2:55 PM  Result Value Ref Range   Sodium 134 (L) 135 - 145 mmol/L   Potassium 4.2 3.5 - 5.1 mmol/L   Chloride 104 101 - 111 mmol/L   CO2 20 (L) 22 - 32 mmol/L   Glucose, Bld 126 (H) 65 - 99 mg/dL   BUN 20 6 - 20 mg/dL   Creatinine, Ser 1.04 0.61 - 1.24 mg/dL   Calcium 8.6 (L) 8.9  - 10.3 mg/dL   GFR calc non Af Amer >60 >60 mL/min   GFR calc Af Amer >60 >60 mL/min    Comment: (NOTE) The eGFR has been calculated using the CKD EPI equation. This calculation has not been validated in all clinical situations. eGFR's persistently <60 mL/min  signify possible Chronic Kidney Disease.    Anion gap 10 5 - 15  Basic metabolic panel     Status: Abnormal   Collection Time: 06/02/15  6:14 AM  Result Value Ref Range   Sodium 138 135 - 145 mmol/L   Potassium 4.6 3.5 - 5.1 mmol/L   Chloride 109 101 - 111 mmol/L   CO2 17 (L) 22 - 32 mmol/L   Glucose, Bld 82 65 - 99 mg/dL   BUN 22 (H) 6 - 20 mg/dL   Creatinine, Ser 0.92 0.61 - 1.24 mg/dL   Calcium 9.0 8.9 - 10.3 mg/dL   GFR calc non Af Amer >60 >60 mL/min   GFR calc Af Amer >60 >60 mL/min    Comment: (NOTE) The eGFR has been calculated using the CKD EPI equation. This calculation has not been validated in all clinical situations. eGFR's persistently <60 mL/min signify possible Chronic Kidney Disease.    Anion gap 12 5 - 15    Comment: Performed at [facility]    Blood Alcohol level:  Lab Results  Component Value Date   [facility] <5 05/31/2015    Physical Findings: AIMS: Facial and Oral Movements Muscles of Facial Expression: None, normal Lips and Perioral Area: None, normal Jaw: None, normal Tongue: None, normal,Extremity Movements Upper (arms, wrists, hands, fingers): None, normal Lower (legs, knees, ankles, toes): None, normal, Trunk Movements Neck, shoulders, hips: None, normal, Overall Severity Severity of abnormal movements (highest score from questions above): None, normal Incapacitation due to abnormal movements: None, normal Patient's awareness of abnormal movements (rate only patient's report): No Awareness, Dental Status Current problems with teeth and/or dentures?: No Does patient usually wear dentures?: No  CIWA:    COWS:     Musculoskeletal: Strength & Muscle Tone: within normal  limits Gait & Station: normal Patient leans: N/A  Psychiatric Specialty Exam: ROS chronic diarrhea, chronic abdominal discomfort, chronic open wound on laparotomy scar- denies discharge , fever, or chills , denies lower GI bleeding at this time   Blood pressure 118/77, pulse 95, temperature 97.4 F (36.3 C), temperature source Oral, resp. rate 18, height _0  (1.575 m), weight 153 lb (69.4 kg), SpO2 99 %.Body mass index is 27.98 kg/(m^2).  General Appearance: improved grooming   Eye Contact::  Good  Speech:  Normal Rate  Volume:  Normal  Mood:  Remains depressed, but states he is feeling  " a little better "  Affect:  Remains constricted, but does smile at times appropriately   Thought Process:  Linear  Orientation:  Full (Time, Place, and Person)  Thought Content:  no hallucinations, no delusions   Suicidal Thoughts:  No denies suicidal or self injurious ideations   Homicidal Thoughts:  No  Memory:  recent and remote grossly intact   Judgement:  Improving   Insight:  Present  Psychomotor Activity:  Normal  Concentration:  Good  Recall:  Good  Fund of Knowledge:Good  Language: Good  Akathisia:  Negative  Handed:  Right  AIMS (if indicated):     Assets:  Communication Skills Desire for Improvement Resilience  ADL's:  Intact  Cognition: WNL  Sleep:  Number of Hours: 6.25  Assessment - patient reports ongoing depression, but states he is feeling better compared to admission. He has some passive suicidal thoughts , but no active suicidal ideations and is able to contract for safety. Denies medication side effects. Reports that chronic GI illness ( Chron's ) is a significant cause and contributor to  his depression. At this time future oriented , and focused, for example, on getting GI outpatient care established . Of note, patient reports episodes of hypomania in the past, and does feel he has clear history of Bipolar Spectrum Disorder. Thus far tolerating Lamictal well .  Treatment  Plan Summary: Daily contact with patient to assess and evaluate symptoms and progress in treatment, Medication management, Plan inpatient treatment and medications as below  Encourage group and milieu participation to work on coping skills  And symptom reduction Continue Lamictal 25 mgrs QDAY for depression Continue Neurontin 800 mgrs TID for anxiety, pain Continue Trazodone 50 mgrs QHS PRN for insomnia as needed  Continue management with Imuran and Prednisone for Chron's D   Neita Garnet, MD 06/03/2015, 12:22 PM

## 2015-06-03 NOTE — Progress Notes (Signed)
Recreation Therapy Notes Date: 04.12.2017 Time: 9:30am Location: [address] Group Room   Group Topic: Stress Management  Goal Area(s) Addresses:  Patient will actively participate in stress management techniques presented during session.   Behavioral Response: Did not attend.    Atheena Spano L Nicolo Tomko, LRT/CTRS        Melina Mosteller L 06/03/2015 12:04 PM 

## 2015-06-03 NOTE — Progress Notes (Signed)
DAR NOTE: Pt present with flat affect and depressed mood in the unit. Pt has been in the dayroom most of the time. Pt complained chronic lower back pain, took all his meds as scheduled. As per self inventory, pt had a fair night sleep, poor appetite, low energy, and poor concentration. Pt rate depression at 7, hopeless ness at 8, and anxiety at a 7. Pt's safety ensured with 15 minute and environmental checks. Pt currently denies SI/HI and A/V hallucinations. Pt verbally agrees to seek staff if SI/HI or A/VH occurs and to consult with staff before acting on these thoughts. Will continue POC.

## 2015-06-03 NOTE — [facility] Group Notes (Signed)
[facility] LCSW Group Therapy 06/03/2015 1:15 PM  Type of Therapy: Group Therapy- Emotion Regulation  Participation Level: Active   Participation Quality:  Appropriate  Affect: Lethargic  Cognitive: Alert and Oriented   Insight:  Developing/Improving  Engagement in Therapy: Developing/Improving and Engaged   Modes of Intervention: Clarification, Confrontation, Discussion, Education, Exploration, Limit-setting, Orientation, Problem-solving, Rapport Building, Dance movement psychotherapist, Socialization and Support  Summary of Progress/Problems: The topic for group today was emotional regulation. This group focused on both positive and negative emotion identification and allowed group members to process ways to identify feelings, regulate negative emotions, and find healthy ways to manage internal/external emotions. Group members were asked to reflect on a time when their reaction to an emotion led to a negative outcome and explored how alternative responses using emotion regulation would have benefited them. Group members were also asked to discuss a time when emotion regulation was utilized when a negative emotion was experienced. Pt identified anxiety as the emotion that is typically most difficult to regulate. He discussed the physiological effects from anxiety and how this can become overwhelming. Pt interacts well with patients and is receptive to feedback.   Chad Cordial, LCSWA 06/03/2015 4:41 PM

## 2015-06-03 NOTE — Progress Notes (Signed)
Adult Psychoeducational Group Note  Date:  06/03/2015 Time:  9:11 PM  Group Topic/Focus:  Wrap-Up Group:   The focus of this group is to help patients review their daily goal of treatment and discuss progress on daily workbooks.  Participation Level:  Active  Participation Quality:  Appropriate  Affect:  Appropriate  Cognitive:  Alert  Insight: Appropriate  Engagement in Group:  Engaged  Modes of Intervention:  Discussion  Additional Comments:  Patient stated "my day has been a roller coaster". Patient stated he has talked with the doctor. Patient goal for today was to "find out my official diagnosis".  Taegan Standage L Labib Cwynar 06/03/2015, 9:11 PM

## 2015-06-03 NOTE — Progress Notes (Signed)
Patient ID: Andre [name], male   DOB: [birth year], 30 y.o.   MRN: [id number] D: client visible in dayroom, watching TV, c/o pain in abdomen. "day been up and down" "increased anxiety with that guy throwing things" A: Writer reviews and administers medication for pain [facility]) Staff will monitor q50min for safety. R: Client is safe on the unit, attends group.

## 2015-06-04 DIAGNOSIS — F332 Major depressive disorder, recurrent severe without psychotic features: Secondary | ICD-10-CM | POA: Diagnosis not present

## 2015-06-04 MED ORDER — SERTRALINE HCL 25 MG PO TABS
25.0000 mg | ORAL_TABLET | Freq: Every day | ORAL | Status: DC
  Administered 2015-06-04 – 2015-06-06 (×3): 25 mg via ORAL
  Filled 2015-06-04 (×6): qty 1

## 2015-06-04 MED ORDER — ALPRAZOLAM 0.5 MG PO TABS
0.5000 mg | ORAL_TABLET | Freq: Every day | ORAL | Status: DC
  Administered 2015-06-05: 0.5 mg via ORAL
  Filled 2015-06-04: qty 1

## 2015-06-04 MED ORDER — PRAZOSIN HCL 1 MG PO CAPS
1.0000 mg | ORAL_CAPSULE | Freq: Every day | ORAL | Status: DC
  Administered 2015-06-04 – 2015-06-08 (×5): 1 mg via ORAL
  Filled 2015-06-04 (×8): qty 1

## 2015-06-04 NOTE — Progress Notes (Signed)
Patient ID: Andre [name], male   DOB: 12/15/1985, 30 y.o.   MRN: [id number] PER STATE REGULATIONS 482.30  THIS CHART WAS REVIEWED FOR MEDICAL NECESSITY WITH RESPECT TO THE PATIENT'S ADMISSION/ DURATION OF STAY.  NEXT REVIEW DATE: 06/08/2015  Willa Rough, RN, BSN CASE MANAGER'

## 2015-06-04 NOTE — Progress Notes (Signed)
Patient ID: Andre [name], male   DOB: 02/09/1986, 30 y.o.   MRN: [id number] D: Client complains of pain "9" of 10, LLQ. Client reports Chron's disease, but worsening pain since surgeries. Client reports surgeries in [place], moved to [place] to live with a friend. Client plans to follow up with pain clinic for chronic pain. Client report nausea, relates it to cafeteria food, requesting sandwiches for meals. A: Writer reviewed and administered pain medication. Writer to reports meal requests to physician. Ginger ale given to help settle stomach as Zofran not scheduled at this time. Staff to monitor q19min for safety. R: Client is safe on the unit, attended group after obtaining pain medications.

## 2015-06-04 NOTE — Progress Notes (Signed)
Patient ID: Andre [name], male   DOB: [birth year], 30 y.o.   MRN: [id number]  DAR: Pt. Denies SI/HI and A/V Hallucinations. He reports sleep is poor, appetite is poor, energy level is low, and concentration is poor. He rates depression 8/10, hopelessness 7/10, and anxiety 7/10. Patient does continue to report pain and is receiving PRN pain medication which is providing relief. He also reported some diarrhea this afternoon and received Imodium which patient reports was effective. Support and encouragement provided to the patient. Scheduled medications administered to patient per physician's orders. Patient is receptive and cooperative. He is seen in the milieu and is attending groups. Q15 minute checks are maintained for safety.
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
## 2015-06-04 NOTE — Progress Notes (Signed)
Adult Psychoeducational Group Note  Date:  06/04/2015 Time:  10:47 PM  Group Topic/Focus:  Wrap-Up Group:   The focus of this group is to help patients review their daily goal of treatment and discuss progress on daily workbooks.  Participation Level:  Active  Participation Quality:  Appropriate  Affect:  Appropriate  Cognitive:  Alert  Insight: Appropriate  Engagement in Group:  Engaged  Modes of Intervention:  Discussion  Additional Comments:  Patient states "my day was okay, but the food is making me sick". Patient states he has talked with the doctor to get medication switched around.   Zavia Pullen L Sharlie Shreffler 06/04/2015, 10:47 PM

## 2015-06-05 DIAGNOSIS — F332 Major depressive disorder, recurrent severe without psychotic features: Secondary | ICD-10-CM | POA: Diagnosis not present

## 2015-06-05 NOTE — Progress Notes (Signed)
Patient ID: Andre [name], male   DOB: 21-Jun-1985, 30 y.o.   MRN: [id number] D: Client is visible in the day room watching TV and interacting with peers. Client reports depression as "6" of 10. Goals today was "to stay awake" "stay out of room" Client reports he went outside today. Client requests as needed pain medications to the hour every four hours, often asking for the time so he can ask for the next medication in four hours. A: Writer provides emotional support. Reviews and administers medications. Staff will monitor q90min for safety.

## 2015-06-05 NOTE — Progress Notes (Addendum)
D Ku is seen out in the milieu..he interacts with his friends appropriately and has had no conflict today, thus far. He completed his daily assessment and on it he wrote he deneid SI today and he rated his depression., hopelessness and  Anxiety " 3/5/6", respectively. A He is given percocet with oxycodone for  C/o abd pain...about every 4 hours , as needed ( thus far given today at 1003 and  1415)  He states " some "  relief 1 hr later. R Safety is in place and poc cont.

## 2015-06-05 NOTE — [facility] Group Notes (Signed)
[facility] LCSW Aftercare Discharge Planning Group Note  06/05/2015 8:45 AM  Participation Quality: Alert, Appropriate and Oriented  Mood/Affect: Lethargic  Depression Rating: 3  Anxiety Rating: "okay"  Thoughts of Suicide: Pt denies SI/HI  Will you contract for safety? Yes  Current AVH: Pt denies  Plan for Discharge/Comments: Pt attended discharge planning group and actively participated in group. CSW discussed suicide prevention education with the group and encouraged them to discuss discharge planning and any relevant barriers. Pt continues to be agreeable to outpatient referrals. He expresses that he is having medications adjusted and is unsure when he will be discharged.  Transportation Means: Pt reports access to transportation  Supports: No supports mentioned at this time  Chad Cordial, LCSWA 06/05/2015 4:05 PM

## 2015-06-05 NOTE — Tx Team (Signed)
Interdisciplinary Treatment Plan Update (Adult) Date: 06/05/2015   Date: 06/05/2015 3:39 PM  Progress in Treatment:  Attending groups: Yes  Participating in groups: Yes Taking medication as prescribed: Yes  Tolerating medication: Yes  Family/Significant othe contact made: No, CSW attempting to make contact with roomate Patient understands diagnosis: Yes AEB seeking help with depression Discussing patient identified problems/goals with staff: Yes  Medical problems stabilized or resolved: Yes  Denies suicidal/homicidal ideation: Yes Patient has not harmed self or Others: Yes   New problem(s) identified: None identified at this time.   Discharge Plan or Barriers: Pt will return home and follow-up with outpatient providers  Additional comments:  Patient and CSW reviewed pt's identified goals and treatment plan. Patient verbalized understanding and agreed to treatment plan. CSW reviewed [facility] "Discharge Process and Patient Involvement" Form. Pt verbalized understanding of information provided and signed form.   Reason for Continuation of Hospitalization:  Anxiety Depression Medication stabilization Suicidal ideation  Estimated length of stay: 2-3 days  Review of initial/current patient goals per problem list:   1.  Goal(s): Patient will participate in aftercare plan  Met:  Yes  Target date: 3-5 days from date of admission   As evidenced by: Patient will participate within aftercare plan AEB aftercare provider and housing plan at discharge being identified.   06/01/15: Pt will return home and follow-up with outpatient providers  2.  Goal (s): Patient will exhibit decreased depressive symptoms and suicidal ideations.  Met:  Yes  Target date: 3-5 days from date of admission   As evidenced by: Patient will utilize self rating of depression at 3 or below and demonstrate decreased signs of depression or be deemed stable for discharge by MD. 06/01/15: Pt was admitted with symptoms of  depression, rating 10/10. Pt continues to present with flat affect and depressive symptoms.  Pt will demonstrate decreased symptoms of depression and rate depression at 3/10 or lower prior to discharge. 06/05/15: Pt rates depression at 3/10; denies SI   3.  Goal(s): Patient will demonstrate decreased signs and symptoms of anxiety.  Met:  Progressing  Target date: 3-5 days from date of admission   As evidenced by: Patient will utilize self rating of anxiety at 3 or below and demonstrated decreased signs of anxiety, or be deemed stable for discharge by MD 06/01/15: Pt was admitted with increased levels of anxiety and is currently rating those symptoms highly. Pt will demonstrated decreased symptoms of anxiety and rate it at 3/10 prior to d/c. 06/05/15: Pt reports improvement in anxiety symptoms. Will continue to monitor  Attendees:  Patient:    Family:    Physician: Dr. Cobos, MD  06/05/2015 3:39 PM  Nursing:  06/05/2015 3:39 PM  Clinical Social Worker  Carter, LCSWA 06/05/2015 3:39 PM  Other: Kristin Drinkard, LCSWA 06/05/2015 3:39 PM  Clinical:  Sara Twyman, RN; Patricia Duke, RN 06/05/2015 3:39 PM  Other: , RN Charge Nurse 06/05/2015 3:39 PM  Other:      Carter, LCSWA Clinical Social Work [phone]      

## 2015-06-05 NOTE — Progress Notes (Signed)
Patient ID: Andre [name], male   DOB: [birth year], 30 y.o.   MRN: [id number] [facility] MD Progress Note  06/05/2015 3:41 PM Andre [name]  MRN:  [id number] Subjective:   Reports some ongoing depression, but states feeling better overall. States his depression fluctuates, to some degree, depending on his GI symptoms, and that periods of time when he is experiencing increased abdominal distress cause him to feel more depressed and hopeless. He states that he is fortunately having an "OK " day regarding his underlying Chron's Disease , but states that GI symptoms never quite disappear or resolve, and are severe at times  Denies medication side effects.   Objective : I have discussed case with treatment team and have met with patient . Patient endorses partial improvement of mood and affect , less severely depressed, affect more reactive. At this time denies suicidal ideations . Reports being quite reactive to milieu, and when there are other patients that appear angry, or are loud, or if someone appears agitated, he tends to feel more anxious , depressed. Responds well to support, encouragement. Denies medication side effects at this time . States he slept better with reintroduction of Minipress, and denies any dizziness, lightheadedness or side effect from this medication. Did not have nightmares last night . Visible on unit, going to groups, no disruptive or agitated behaviors on unit - interacting appropriately with peers . Principal Problem:  MDD  Diagnosis:   Patient Active Problem List   Diagnosis Date Noted  . Suicidal ideations [R45.851] 05/31/2015  . Exacerbation of Crohn's disease ([place]) [K50.90] 05/27/2015  . Hypokalemia [E87.6] 05/27/2015  . Crohn's disease of both small and large intestine without complication ([place]) [G28.36]   . LLQ abdominal pain [R10.32]   . 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 Chronic back pain greater than 3 months duration [M54.9, G89.29] 11/17/2014  . Tobacco dependence  [F17.200] 11/17/2014  . Abdominal pain [R10.9] 11/05/2014  . Metabolic acidosis, increased anion gap (IAG) [E87.2] 11/05/2014  . Crohn's colitis ([place]) [K50.10] 10/26/2014  . Acute hypokalemia [E87.6] 10/26/2014  . Acute hyperglycemia [R73.09] 10/26/2014  . Refractory nausea and vomiting [R11.2] 10/26/2014  . Dehydration, moderate [E86.0] 10/26/2014  . Microcytic anemia [D50.9] 10/26/2014  . Bipolar affective disorder ([place]) [F31.9] 10/26/2014  . Anxiety disorder [F41.9] 10/26/2014  . Acute Crohn's disease ([place]) [K50.90] 10/26/2014   Total Time spent with patient: 20 minutes    Past Medical History:  Past Medical History  Diagnosis Date  . Anxiety   . Bipolar depression ([place])   . Crohn disease ([place]) 1997  . Anemia     Around 2013 required PRBC transfusions. Has received parenteral iron in past. Has taken oral iron in past.    Past Surgical History  Procedure Laterality Date  . Subtotal colectomy      Has undergone a total of 3 separate bowel resections including terminal ileal resection and the equivalent of subtotal colectomy. Last surgery was in 2010.  Marland Kitchen Flexible sigmoidoscopy N/A 10/29/2014    Procedure: FLEXIBLE SIGMOIDOSCOPY;  Surgeon: Manus Gunning, MD;  Location: [place];  Service: Gastroenterology;  Laterality: N/A;   Family History:  Family History  Problem Relation Age of Onset  . Hypertension Father     Social History:  History  Alcohol Use No     History  Drug Use No    Social History   Social History  . Marital Status: Single    Spouse Name: N/A  . Number of Children: N/A  . Years  of Education: N/A   Social History Main Topics  . Smoking status: Current Every Day Smoker -- 0.50 packs/day for 5 years  . Smokeless tobacco: Never Used  . Alcohol Use: No  . Drug Use: No  . Sexual Activity: Not Currently   Other Topics Concern  . None   Social History Narrative   Additional Social History:   Sleep: fair   Appetite:  Fair    Current Medications: Current Facility-Administered Medications  Medication Dose Route Frequency Provider Last Rate Last Dose  . acetaminophen (TYLENOL) tablet 650 mg  650 mg Oral Q6H PRN Nanci Pina, FNP      . ALPRAZolam Duanne Moron) tablet 0.5 mg  0.5 mg Oral QHS Myer Peer Cobos, MD      . azaTHIOprine (IMURAN) tablet 175 mg  175 mg Oral Q breakfast Nicholaus Bloom, MD   175 mg at 06/05/15 0827  . gabapentin (NEURONTIN) capsule 800 mg  800 mg Oral TID Nicholaus Bloom, MD   800 mg at 06/05/15 1200  . lamoTRIgine (LAMICTAL) tablet 25 mg  25 mg Oral Daily Jenne Campus, MD   25 mg at 06/05/15 0828  . loperamide (IMODIUM) capsule 2 mg  2 mg Oral PRN Nicholaus Bloom, MD   2 mg at 06/05/15 1414  . magnesium hydroxide (MILK OF MAGNESIA) suspension 30 mL  30 mL Oral Daily PRN Nanci Pina, FNP      . nicotine (NICODERM CQ - dosed in mg/24 hours) patch 21 mg  21 mg Transdermal Daily Jenne Campus, MD   21 mg at 06/05/15 0833  . ondansetron (ZOFRAN-ODT) disintegrating tablet 8 mg  8 mg Oral Q8H PRN Nicholaus Bloom, MD   8 mg at 06/03/15 1458  . oxyCODONE-acetaminophen (PERCOCET/ROXICET) 5-325 MG per tablet 1 tablet  1 tablet Oral Q4H PRN Nicholaus Bloom, MD   1 tablet at 06/05/15 1414   And  . oxyCODONE (Oxy IR/ROXICODONE) immediate release tablet 5 mg  5 mg Oral Q4H PRN Nicholaus Bloom, MD   5 mg at 06/05/15 1414  . prazosin (MINIPRESS) capsule 1 mg  1 mg Oral QHS Jenne Campus, MD   1 mg at 06/04/15 2213  . predniSONE (DELTASONE) tablet 20 mg  20 mg Oral Q breakfast Nicholaus Bloom, MD   20 mg at 06/05/15 2500  . sertraline (ZOLOFT) tablet 25 mg  25 mg Oral Daily Jenne Campus, MD   25 mg at 06/05/15 0827    Lab Results:  No results found for this or any previous visit (from the past 13 hour(s)).  Blood Alcohol level:  Lab Results  Component Value Date   ETH <5 05/31/2015    Physical Findings: AIMS: Facial and Oral Movements Muscles of Facial Expression: None, normal Lips and Perioral  Area: None, normal Jaw: None, normal Tongue: None, normal,Extremity Movements Upper (arms, wrists, hands, fingers): None, normal Lower (legs, knees, ankles, toes): None, normal, Trunk Movements Neck, shoulders, hips: None, normal, Overall Severity Severity of abnormal movements (highest score from questions above): None, normal Incapacitation due to abnormal movements: None, normal Patient's awareness of abnormal movements (rate only patient's report): No Awareness, Dental Status Current problems with teeth and/or dentures?: No Does patient usually wear dentures?: No  CIWA:    COWS:     Musculoskeletal: Strength & Muscle Tone: within normal limits Gait & Station: normal Patient leans: N/A  Psychiatric Specialty Exam: ROS chronic diarrhea, chronic abdominal discomfort, chronic open wound  on laparotomy scar- denies discharge , fever, or chills , denies lower GI bleeding at this time   Blood pressure 109/78, pulse 118, temperature 97.6 F (36.4 C), temperature source Oral, resp. rate 16, height 5' 2"  (1.575 m), weight 153 lb (69.4 kg), SpO2 99 %.Body mass index is 27.98 kg/(m^2).  General Appearance: improved grooming   Eye Contact::  Good  Speech:  Normal Rate  Volume:  Normal  Mood:  Remains depressed, but states improving compared to admission   Affect:  Constricted, gradually becoming more reactive   Thought Process:  Linear  Orientation:  Full (Time, Place, and Person)  Thought Content:  no hallucinations, no delusions   Suicidal Thoughts:  No denies suicidal or self injurious ideations   Homicidal Thoughts:  No  Memory:  recent and remote grossly intact   Judgement:  Improving   Insight:  Present  Psychomotor Activity:  Normal  Concentration:  Good  Recall:  Good  Fund of Knowledge:Good  Language: Good  Akathisia:  Negative  Handed:  Right  AIMS (if indicated):     Assets:  Communication Skills Desire for Improvement Resilience  ADL's:  Intact  Cognition: WNL   Sleep:  Number of Hours: 6.25  Assessment - patient  Gradually improving , with overall improved mood and range of affect. Patient states he is better, but still depressed, and that symptoms vary significantly depending on milieu environment and on level of GI pain, symptoms . Thus far tolerating Lamictal and Zoloft well, no side effects .  Treatment Plan Summary: Daily contact with patient to assess and evaluate symptoms and progress in treatment, Medication management, Plan inpatient treatment and medications as below  Encourage group and milieu participation to work on coping skills  And symptom reduction Continue Lamictal 25 mgrs QDAY for depression Continue Neurontin 800 mgrs TID for anxiety, pain Continue Xanax 0.5 mgrs QHS - beign tapered gradually  Continue  Minipress 1 mgr QHS for PTSD related nightmares  Continue Zoloft 25 mgrs QDAY for depression  Continue management with Imuran and Prednisone for Chron's D   Neita Garnet, MD 06/05/2015, 3:41 PM

## 2015-06-05 NOTE — [facility] Group Notes (Signed)
[facility] LCSW Group Therapy 06/05/2015 1:15pm  Type of Therapy: Group Therapy- Feelings Around Relapse and Recovery  Participation Level: Active   Participation Quality:  Appropriate  Affect:  Lethargic  Cognitive: Alert and Oriented   Insight:  Developing   Engagement in Therapy: Developing/Improving and Engaged   Modes of Intervention: Clarification, Confrontation, Discussion, Education, Exploration, Limit-setting, Orientation, Problem-solving, Rapport Building, Dance movement psychotherapist, Socialization and Support  Summary of Progress/Problems: The topic for today was feelings about relapse. The group discussed what relapse prevention is to them and identified triggers that they are on the path to relapse. Members also processed their feeling towards relapse and were able to relate to common experiences. Group also discussed coping skills that can be used for relapse prevention.  Pt continues to participate actively in group despite seeming lethargic. He continues to emphasize having a good support system, particularly those you trust to be honest with him about his recovery. Pt offered suggestions of journaling to increase awareness in order to prevent relapse.   Therapeutic Modalities:   Cognitive Behavioral Therapy Solution-Focused Therapy Assertiveness Training Relapse Prevention Therapy    Andre [name] [phone] 06/05/2015 4:07 PM

## 2015-06-05 NOTE — Progress Notes (Signed)
Adult Psychoeducational Group Note  Date:  06/05/2015 Time:  8:58 PM  Group Topic/Focus:  Wrap-Up Group:   The focus of this group is to help patients review their daily goal of treatment and discuss progress on daily workbooks.  Participation Level:  Active  Participation Quality:  Appropriate  Affect:  Appropriate  Cognitive:  Alert  Insight: Appropriate  Engagement in Group:  Engaged  Modes of Intervention:  Discussion  Additional Comments:  Pt stated that he was able to get some things accomplished today. His goal was to stay and he was able to achieve it.   Kaleen Odea R 06/05/2015, 8:58 PM

## 2015-06-06 DIAGNOSIS — F332 Major depressive disorder, recurrent severe without psychotic features: Secondary | ICD-10-CM | POA: Diagnosis not present

## 2015-06-06 MED ORDER — SERTRALINE HCL 50 MG PO TABS
50.0000 mg | ORAL_TABLET | Freq: Every day | ORAL | Status: DC
  Administered 2015-06-07 – 2015-06-09 (×3): 50 mg via ORAL
  Filled 2015-06-06 (×5): qty 1

## 2015-06-06 NOTE — [facility] Group Notes (Signed)
[facility] LCSW Group Therapy  06/06/2015 10:00 AM  Type of Therapy:  Processing  Participation Level:  Active  Participation Quality:  Appropriate and Attentive  Affect:  Appropriate  Cognitive:  Alert, Appropriate and Oriented  Insight:  Developing/Improving  Engagement in Therapy:  Engaged and Supportive  Modes of Intervention:  Discussion  Summary of Progress/Problems: Group discussed systems of care. Group discussion looked at the system including self, close network and extended network. Close network included nearby or almost daily contacts. Extended network included weekly/semiweekly contact, which can also include professionals. Discussed the positives and negatives of these systems and ways to manage them. Discussed use of Thinking/Feeling/Doing triangle in decision making when mood and emotions have an impact on perception. Patient was very introspective in his contributions to group discussion. Patient was able to share that he learned self value in order to support and develop his self esteem.   Beverly Sessions 06/06/2015, 12:46 PM

## 2015-06-06 NOTE — [facility] Group Notes (Signed)
[facility] Group Notes:  (Nursing/MHT/Case Management/Adjunct)  Date:  06/06/2015  Time:  1315  Type of Therapy:  Nurse Education  /   Life SKills  :  The group focuses on teaching patients how to identify their needs as well as identify  Skills needed to get their needs met.  Participation Level:  Active  Participation Quality:  Appropriate  Affect:  Appropriate  Cognitive:  Alert  Insight:  Appropriate  Engagement in Group:  Engaged  Modes of Intervention:  Education  Summary of Progress/Problems:  Lauralyn Primes 06/06/2015, 2:41 PM

## 2015-06-06 NOTE — Progress Notes (Signed)
D:Pt reports that he thought other peers were talking about him and realized that one of them was asleep and the other person was not talking about him. Pt reports that this happens before a manic stage. He says that it comes from his past and is related to his family.  Pt has been out in the dayroom interacting with his peers.  A:Supported pt to discuss feelings. Offered encouragement and 15 minute checks. R:Pt denies si and hi. Safety maintained on the unit.

## 2015-06-06 NOTE — Progress Notes (Signed)
D.  Pt pleasant on approach, playing cards with peers.  Pt denies complaints at this time, but does request his ordered pain medication at earliest available time.  Pt was positive for evening wrap up group, see group notes.  Pt is interacting appropriately with peers on the unit.  A.  Support and encouragement offered, medication given as ordered  R.  Pt remains safe on the unit, will continue to monitor.

## 2015-06-06 NOTE — Progress Notes (Signed)
Patient ID: Andre [name], male   DOB: January 12, 1986, 30 y.o.   MRN: [id number] [facility] MD Progress Note  06/06/2015 3:51 PM Cordel Drewes  MRN:  [id number] Subjective:   Reports he is feeling better and thus far having a better day, which he attributes partly to improved abdominal symptoms and pain. States " I think I finally figured out what I can eat at the cafeteria that will not make me hurt later on - ham and cheese sandwiches . He states that when his abdominal discomfort, pain abates, his mood tends to improve. He also states he realizes he is sensitive to milieu and today has been a " quieter day" " everyone seems more relaxed " Denies medication side effects.   Objective : I have discussed case with treatment team and have met with patient . Presents with partially improved mood, less depression, affect still tends to be constricted but has been more reactive .  Denies medication side effects- does feel medications are " helping me".  Visible on unit, going to groups, interacting  appropriately with selected peers  Principal Problem:  MDD  Diagnosis:   Patient Active Problem List   Diagnosis Date Noted  . Suicidal ideations [R45.851] 05/31/2015  . Exacerbation of Crohn's disease ([place]) [K50.90] 05/27/2015  . Hypokalemia [E87.6] 05/27/2015  . Crohn's disease of both small and large intestine without complication ([place]) [C58.85]   . LLQ abdominal pain [R10.32]   . 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 Chronic back pain greater than 3 months duration [M54.9, G89.29] 11/17/2014  . Tobacco dependence [F17.200] 11/17/2014  . Abdominal pain [R10.9] 11/05/2014  . Metabolic acidosis, increased anion gap (IAG) [E87.2] 11/05/2014  . Crohn's colitis ([place]) [K50.10] 10/26/2014  . Acute hypokalemia [E87.6] 10/26/2014  . Acute hyperglycemia [R73.09] 10/26/2014  . Refractory nausea and vomiting [R11.2] 10/26/2014  . Dehydration, moderate [E86.0] 10/26/2014  . Microcytic anemia [D50.9] 10/26/2014  . Bipolar  affective disorder ([place]) [F31.9] 10/26/2014  . Anxiety disorder [F41.9] 10/26/2014  . Acute Crohn's disease ([place]) [K50.90] 10/26/2014   Total Time spent with patient: 20 minutes    Past Medical History:  Past Medical History  Diagnosis Date  . Anxiety   . Bipolar depression ([place])   . Crohn disease ([place]) 1997  . Anemia     Around 2013 required PRBC transfusions. Has received parenteral iron in past. Has taken oral iron in past.    Past Surgical History  Procedure Laterality Date  . Subtotal colectomy      Has undergone a total of 3 separate bowel resections including terminal ileal resection and the equivalent of subtotal colectomy. Last surgery was in 2010.  Marland Kitchen Flexible sigmoidoscopy N/A 10/29/2014    Procedure: FLEXIBLE SIGMOIDOSCOPY;  Surgeon: Manus Gunning, MD;  Location: [facility];  Service: Gastroenterology;  Laterality: N/A;   Family History:  Family History  Problem Relation Age of Onset  . Hypertension Father     Social History:  History  Alcohol Use No     History  Drug Use No    Social History   Social History  . Marital Status: Single    Spouse Name: N/A  . Number of Children: N/A  . Years of Education: N/A   Social History Main Topics  . Smoking status: Current Every Day Smoker -- 0.50 packs/day for 5 years  . Smokeless tobacco: Never Used  . Alcohol Use: No  . Drug Use: No  . Sexual Activity: Not Currently   Other Topics Concern  .  None   Social History Narrative   Additional Social History:   Sleep:  Improved    Appetite:  Improved   Current Medications: Current Facility-Administered Medications  Medication Dose Route Frequency Provider Last Rate Last Dose  . acetaminophen (TYLENOL) tablet 650 mg  650 mg Oral Q6H PRN Nanci Pina, FNP      . ALPRAZolam Duanne Moron) tablet 0.5 mg  0.5 mg Oral QHS Myer Peer Lynnley Doddridge, MD   0.5 mg at 06/05/15 2155  . azaTHIOprine (IMURAN) tablet 175 mg  175 mg Oral Q breakfast Nicholaus Bloom, MD   175  mg at 06/06/15 0750  . gabapentin (NEURONTIN) capsule 800 mg  800 mg Oral TID Nicholaus Bloom, MD   800 mg at 06/06/15 1148  . lamoTRIgine (LAMICTAL) tablet 25 mg  25 mg Oral Daily Jenne Campus, MD   25 mg at 06/06/15 0751  . loperamide (IMODIUM) capsule 2 mg  2 mg Oral PRN Nicholaus Bloom, MD   2 mg at 06/05/15 1414  . magnesium hydroxide (MILK OF MAGNESIA) suspension 30 mL  30 mL Oral Daily PRN Nanci Pina, FNP      . nicotine (NICODERM CQ - dosed in mg/24 hours) patch 21 mg  21 mg Transdermal Daily Jenne Campus, MD   21 mg at 06/06/15 0757  . ondansetron (ZOFRAN-ODT) disintegrating tablet 8 mg  8 mg Oral Q8H PRN Nicholaus Bloom, MD   8 mg at 06/06/15 1415  . oxyCODONE-acetaminophen (PERCOCET/ROXICET) 5-325 MG per tablet 1 tablet  1 tablet Oral Q4H PRN Nicholaus Bloom, MD   1 tablet at 06/06/15 1415   And  . oxyCODONE (Oxy IR/ROXICODONE) immediate release tablet 5 mg  5 mg Oral Q4H PRN Nicholaus Bloom, MD   5 mg at 06/06/15 1415  . prazosin (MINIPRESS) capsule 1 mg  1 mg Oral QHS Jenne Campus, MD   1 mg at 06/05/15 2155  . predniSONE (DELTASONE) tablet 20 mg  20 mg Oral Q breakfast Nicholaus Bloom, MD   20 mg at 06/06/15 0751  . sertraline (ZOLOFT) tablet 25 mg  25 mg Oral Daily Jenne Campus, MD   25 mg at 06/06/15 0751    Lab Results:  No results found for this or any previous visit (from the past 48 hour(s)).  Blood Alcohol level:  Lab Results  Component Value Date   ETH <5 05/31/2015    Physical Findings: AIMS: Facial and Oral Movements Muscles of Facial Expression: None, normal Lips and Perioral Area: None, normal Jaw: None, normal Tongue: None, normal,Extremity Movements Upper (arms, wrists, hands, fingers): None, normal Lower (legs, knees, ankles, toes): None, normal, Trunk Movements Neck, shoulders, hips: None, normal, Overall Severity Severity of abnormal movements (highest score from questions above): None, normal Incapacitation due to abnormal movements: None,  normal Patient's awareness of abnormal movements (rate only patient's report): No Awareness, Dental Status Current problems with teeth and/or dentures?: No Does patient usually wear dentures?: No  CIWA:    COWS:     Musculoskeletal: Strength & Muscle Tone: within normal limits Gait & Station: normal Patient leans: N/A  Psychiatric Specialty Exam: ROS chronic diarrhea, chronic abdominal discomfort, chronic open wound on laparotomy scar- denies discharge , fever, or chills , denies lower GI bleeding at this time   Blood pressure 128/84, pulse 102, temperature 97.6 F (36.4 C), temperature source Oral, resp. rate 16, height _0  (1.575 m), weight 153 lb (69.4 kg), SpO2 99 %.  Body mass index is 27.98 kg/(m^2).  General Appearance: improved grooming   Eye Contact::  Good  Speech:  Normal Rate  Volume:  Normal  Mood:  Gradually improving mood, less depressed   Affect:  Constricted, but more reactive than on admission   Thought Process:  Linear  Orientation:  Full (Time, Place, and Person)  Thought Content:  no hallucinations, no delusions   Suicidal Thoughts:  No denies suicidal or self injurious ideations   Homicidal Thoughts:  No  Memory:  recent and remote grossly intact   Judgement:  Improving   Insight:  Present  Psychomotor Activity:  Normal  Concentration:  Good  Recall:  Good  Fund of Knowledge:Good  Language: Good  Akathisia:  Negative  Handed:  Right  AIMS (if indicated):     Assets:  Communication Skills Desire for Improvement Resilience  ADL's:  Intact  Cognition: WNL  Sleep:  Number of Hours: 6.25  Assessment -patient less severely depressed, less constricted than on admission, which he attributes in part to medication and in part to improved milieu environment and decreased abdominal pain, discomfort. Tolerating medications well thus far. Thus far has tolerated Zoloft trial well , without worsening of GI symptoms . Will complete Xanax taper today.  Treatment Plan  Summary: Daily contact with patient to assess and evaluate symptoms and progress in treatment, Medication management, Plan inpatient treatment and medications as below  Encourage group and milieu participation to work on coping skills  And symptom reduction Continue Lamictal 25 mgrs QDAY for depression Continue Neurontin 800 mgrs TID for anxiety, pain D/C Xanax - patient wants to taper off this medication, expresses concern about its addictive properties  Continue  Minipress 1 mgr QHS for PTSD related nightmares  Increase Zoloft to 50 mgrs QDAY for depression Treatment team working on disposition planning  Continue management with Imuran and Prednisone for Chron's Sherilyn Cooter, MD 06/06/2015, 3:51 PM

## 2015-06-06 NOTE — Plan of Care (Signed)
Problem: Ineffective individual coping Goal: STG: Patient will remain free from self harm Outcome: Progressing Pt denies self harm thoughts.     

## 2015-06-07 DIAGNOSIS — F332 Major depressive disorder, recurrent severe without psychotic features: Secondary | ICD-10-CM | POA: Diagnosis not present

## 2015-06-07 MED ORDER — LAMOTRIGINE 25 MG PO TABS
25.0000 mg | ORAL_TABLET | Freq: Two times a day (BID) | ORAL | Status: DC
  Administered 2015-06-08 – 2015-06-09 (×3): 25 mg via ORAL
  Filled 2015-06-07 (×7): qty 1

## 2015-06-07 MED ORDER — LORAZEPAM 0.5 MG PO TABS
0.5000 mg | ORAL_TABLET | Freq: Two times a day (BID) | ORAL | Status: DC | PRN
  Administered 2015-06-07 – 2015-06-08 (×3): 0.5 mg via ORAL
  Filled 2015-06-07 (×3): qty 1

## 2015-06-07 NOTE — Plan of Care (Signed)
Problem: Diagnosis: Increased Risk For Suicide Attempt Goal: STG-Patient Will Attend All Groups On The Unit Outcome: Progressing Pt attended group     

## 2015-06-07 NOTE — Progress Notes (Signed)
Adult Psychoeducational Group Note  Date:  06/07/2015 Time:  8:54 PM  Group Topic/Focus:  Wrap-Up Group:   The focus of this group is to help patients review their daily goal of treatment and discuss progress on daily workbooks.  Participation Level:  Active  Participation Quality:  Appropriate and Attentive  Affect:  Appropriate  Cognitive:  Appropriate  Insight: Appropriate  Engagement in Group:  Engaged  Modes of Intervention:  Discussion  Additional Comments:  Pt stated his goal was to try to not let today get to him because it is a holiday. Pt stated he did well and stayed in the dayroom socializing with peers. Pt stated one positive thing today was when he spoke with the doctor, part of his discharge planning is to make sure he gets both his psych and medical issues taken care of before he leaves.  Caswell Corwin 06/07/2015, 8:54 PM

## 2015-06-07 NOTE — [facility] Group Notes (Signed)
[facility] LCSW Group Therapy Note   06/07/2015  10 AM   Type of Therapy and Topic: Group Therapy: Feelings Around Returning Home & Establishing a Supportive Framework and Discussion of Strength and Vuneralbility  Participation Level: Active   Description of Group:  Patients first processed thoughts and feelings about up coming discharge. These included fears of upcoming changes, lack of change, new living environments, judgements and expectations from others and overall stigma of MH issues. We then discussed what is a supportive framework? What does it look like feel like and how do I discern it from and unhealthy non-supportive network? Learn how to cope when supports are not helpful and don't support you. Discuss what to do when your family/friends are not supportive.   Therapeutic Goals Addressed in Processing Group:  1. Patient will identify one healthy supportive network that they can use at discharge. 2. Patient will identify one factor of a supportive framework and how to tell it from an unhealthy network. 3. Patient able to identify one coping skill to use when they do not have positive supports from others. 4. Patient will demonstrate ability to communicate their needs through discussion and/or role plays.  Summary of Patient Progress:  Pt engaged easily during group session. As patients processed their anxiety about discharge and described healthy supports patient shared need for more supports. Patient was attentive  engaged and shared frequently. Patient expressed desire to share information we did not have adequate time frame for in group session and appeared irritated that limits were set.     Carney Bern, LCSW

## 2015-06-07 NOTE — [facility] Suicide Risk Assessment (Signed)
[facility] INPATIENT:  Family/Significant Other Suicide Prevention Education  Suicide Prevention Education:  Education Completed; Andre [name], roommate, [phone],  (name of family member/significant other) has been identified by the patient as the family member/significant other with whom the patient will be residing, and identified as the person(s) who will aid the patient in the event of a mental health crisis (suicidal ideations/suicide attempt).  With written consent from the patient, the family member/significant other has been provided the following suicide prevention education, prior to the and/or following the discharge of the patient.  The suicide prevention education provided includes the following:  Suicide risk factors  Suicide prevention and interventions  National Suicide Hotline telephone number  [facility] assessment telephone number  [facility] Emergency Assistance 911  [facility] and/or Residential Mobile Crisis Unit telephone number  Request made of family/significant other to:  Remove weapons (e.g., guns, rifles, knives), all items previously/currently identified as safety concern.    Remove drugs/medications (over-the-counter, prescriptions, illicit drugs), all items previously/currently identified as a safety concern.  The family member/significant other verbalizes understanding of the suicide prevention education information provided.  The family member/significant other agrees to remove the items of safety concern listed above.  Roommate stated there are no firearms in the home, and she willingly took the number for Mobile Crisis in order to help pt in the future.  Andre [name] 06/07/2015, 12:58 PM

## 2015-06-07 NOTE — Plan of Care (Signed)
Problem: Alteration in mood Goal: LTG-Patient reports reduction in suicidal thoughts (Patient reports reduction in suicidal thoughts and is able to verbalize a safety plan for whenever patient is feeling suicidal)  Outcome: Progressing Pt denies SI at this time     

## 2015-06-07 NOTE — Progress Notes (Signed)
Patient ID: Andre [name], male   DOB: 02-May-1985, 30 y.o.   MRN: [id number] [facility] MD Progress Note  06/07/2015 1:28 PM [name]  MRN:  [id number] Subjective:   Reports partial improvement in mood compared to admission, but reports ongoing vague sense of depression, anxiety.  Denies medication side effects. Reports ongoing chronic pain related to Chron's disease, mainly cramping, loose stools, increased pain on defecation, denies lower GI bleeding at this time.   Objective : I have discussed case with treatment team and have met with patient . Patient reports partial improvement, as above, but reports some lingering depression, and episodes of increased anxiety. Denies suicidal ideations, denies medication side effects. Visible in milieu, going to groups, behavior calm, appropriate. States being somewhat apprehensive about discharging soon, due to fear he may feel overwhelmed , but responds to support, encouragement, identification of strengths. He is future oriented, and spoke about one of his goals being to establish GI outpatient care locally.  Principal Problem:  MDD  Diagnosis:   Patient Active Problem List   Diagnosis Date Noted  . Suicidal ideations [R45.851] 05/31/2015  . Exacerbation of Crohn's disease ([place]) [K50.90] 05/27/2015  . Hypokalemia [E87.6] 05/27/2015  . Crohn's disease of both small and large intestine without complication ([place]) [N82.95]   . LLQ abdominal pain [R10.32]   . 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 Chronic back pain greater than 3 months duration [M54.9, G89.29] 11/17/2014  . Tobacco dependence [F17.200] 11/17/2014  . Abdominal pain [R10.9] 11/05/2014  . Metabolic acidosis, increased anion gap (IAG) [E87.2] 11/05/2014  . Crohn's colitis ([place]) [K50.10] 10/26/2014  . Acute hypokalemia [E87.6] 10/26/2014  . Acute hyperglycemia [R73.09] 10/26/2014  . Refractory nausea and vomiting [R11.2] 10/26/2014  . Dehydration, moderate [E86.0] 10/26/2014  . Microcytic anemia  [D50.9] 10/26/2014  . Bipolar affective disorder ([place]) [F31.9] 10/26/2014  . Anxiety disorder [F41.9] 10/26/2014  . Acute Crohn's disease ([place]) [K50.90] 10/26/2014   Total Time spent with patient: 20 minutes    Past Medical History:  Past Medical History  Diagnosis Date  . Anxiety   . Bipolar depression ([place])   . Crohn disease ([place]) 1997  . Anemia     Around 2013 required PRBC transfusions. Has received parenteral iron in past. Has taken oral iron in past.    Past Surgical History  Procedure Laterality Date  . Subtotal colectomy      Has undergone a total of 3 separate bowel resections including terminal ileal resection and the equivalent of subtotal colectomy. Last surgery was in 2010.  Marland Kitchen Flexible sigmoidoscopy N/A 10/29/2014    Procedure: FLEXIBLE SIGMOIDOSCOPY;  Surgeon: Manus Gunning, MD;  Location: [facility];  Service: Gastroenterology;  Laterality: N/A;   Family History:  Family History  Problem Relation Age of Onset  . Hypertension Father     Social History:  History  Alcohol Use No     History  Drug Use No    Social History   Social History  . Marital Status: Single    Spouse Name: N/A  . Number of Children: N/A  . Years of Education: N/A   Social History Main Topics  . Smoking status: Current Every Day Smoker -- 0.50 packs/day for 5 years  . Smokeless tobacco: Never Used  . Alcohol Use: No  . Drug Use: No  . Sexual Activity: Not Currently   Other Topics Concern  . None   Social History Narrative   Additional Social History:   Sleep:  Improved  Appetite:  Improved   Current Medications: Current Facility-Administered Medications  Medication Dose Route Frequency Provider Last Rate Last Dose  . acetaminophen (TYLENOL) tablet 650 mg  650 mg Oral Q6H PRN Nanci Pina, FNP      . azaTHIOprine (IMURAN) tablet 175 mg  175 mg Oral Q breakfast Nicholaus Bloom, MD   175 mg at 06/07/15 0835  . gabapentin (NEURONTIN) capsule 800 mg  800 mg  Oral TID Nicholaus Bloom, MD   800 mg at 06/07/15 1153  . lamoTRIgine (LAMICTAL) tablet 25 mg  25 mg Oral Daily Jenne Campus, MD   25 mg at 06/07/15 0836  . loperamide (IMODIUM) capsule 2 mg  2 mg Oral PRN Nicholaus Bloom, MD   2 mg at 06/06/15 1818  . magnesium hydroxide (MILK OF MAGNESIA) suspension 30 mL  30 mL Oral Daily PRN Nanci Pina, FNP      . nicotine (NICODERM CQ - dosed in mg/24 hours) patch 21 mg  21 mg Transdermal Daily Jenne Campus, MD   21 mg at 06/07/15 1004  . ondansetron (ZOFRAN-ODT) disintegrating tablet 8 mg  8 mg Oral Q8H PRN Nicholaus Bloom, MD   8 mg at 06/07/15 1153  . oxyCODONE-acetaminophen (PERCOCET/ROXICET) 5-325 MG per tablet 1 tablet  1 tablet Oral Q4H PRN Nicholaus Bloom, MD   1 tablet at 06/07/15 0211   And  . oxyCODONE (Oxy IR/ROXICODONE) immediate release tablet 5 mg  5 mg Oral Q4H PRN Nicholaus Bloom, MD   5 mg at 06/07/15 0604  . prazosin (MINIPRESS) capsule 1 mg  1 mg Oral QHS Jenne Campus, MD   1 mg at 06/06/15 2205  . predniSONE (DELTASONE) tablet 20 mg  20 mg Oral Q breakfast Nicholaus Bloom, MD   20 mg at 06/07/15 0836  . sertraline (ZOLOFT) tablet 50 mg  50 mg Oral Daily Jenne Campus, MD   50 mg at 06/07/15 1735    Lab Results:  No results found for this or any previous visit (from the past 48 hour(s)).  Blood Alcohol level:  Lab Results  Component Value Date   ETH <5 05/31/2015    Physical Findings: AIMS: Facial and Oral Movements Muscles of Facial Expression: None, normal Lips and Perioral Area: None, normal Jaw: None, normal Tongue: None, normal,Extremity Movements Upper (arms, wrists, hands, fingers): None, normal Lower (legs, knees, ankles, toes): None, normal, Trunk Movements Neck, shoulders, hips: None, normal, Overall Severity Severity of abnormal movements (highest score from questions above): None, normal Incapacitation due to abnormal movements: None, normal Patient's awareness of abnormal movements (rate only patient's  report): No Awareness, Dental Status Current problems with teeth and/or dentures?: No Does patient usually wear dentures?: No  CIWA:    COWS:     Musculoskeletal: Strength & Muscle Tone: within normal limits Gait & Station: normal Patient leans: N/A  Psychiatric Specialty Exam: ROS chronic diarrhea, chronic abdominal discomfort, chronic open wound on laparotomy scar- denies discharge , fever, or chills , denies lower GI bleeding at this time   Blood pressure 122/70, pulse 93, temperature 97.5 F (36.4 C), temperature source Oral, resp. rate 16, height _0  (1.575 m), weight 153 lb (69.4 kg), SpO2 99 %.Body mass index is 27.98 kg/(m^2).  General Appearance: improved grooming   Eye Contact::  Good  Speech:  Normal Rate  Volume:  Normal  Mood:   Still depressed, but has improved compared to admission   Affect:  Constricted, vaguely anxious, but reactive   Thought Process:  Linear  Orientation:  Full (Time, Place, and Person)  Thought Content:  no hallucinations, no delusions   Suicidal Thoughts:  No denies suicidal or self injurious ideations   Homicidal Thoughts:  No  Memory:  recent and remote grossly intact   Judgement:  Improving   Insight:  Present  Psychomotor Activity:  Normal  Concentration:  Good  Recall:  Good  Fund of Knowledge:Good  Language: Good  Akathisia:  Negative  Handed:  Right  AIMS (if indicated):     Assets:  Communication Skills Desire for Improvement Resilience  ADL's:  Intact  Cognition: WNL  Sleep:  Number of Hours: 5.75  Assessment -patient 's mood  And affect have improved partially. At this time still vaguely depressed, but presenting with improving range of affect and is future oriented. No suicidal ideations. Tolerating medications well at this time. Chronic intermittent GI symptoms related to Chron's Disease . Tolerating Lamicta/ Zoloft trial well thus far  Treatment Plan Summary: Daily contact with patient to assess and evaluate symptoms  and progress in treatment, Medication management, Plan inpatient treatment and medications as below  Encourage group and milieu participation to work on coping skills  And symptom reduction Increase Lamictal  To 25 mgrs BID  for depression Continue Neurontin 800 mgrs TID for anxiety, pain Ativan 0.5 mgrs Q 12 hours PRN for anxiety if needed  Continue  Minipress 1 mgr QHS for PTSD related nightmares  Continue  Zoloft 50 mgrs QDAY for depression Treatment team working on disposition planning  Continue management with Imuran and Prednisone for Chron's Sherilyn Cooter, MD 06/07/2015, 1:28 PM

## 2015-06-07 NOTE — Progress Notes (Signed)
D: Pt denies SI/HI/AVH. Pt is pleasant and cooperative. Pt stated he was doing better due to the medications and the environment.   A: Pt was offered support and encouragement. Pt was given scheduled medications. Pt was encourage to attend groups. Q 15 minute checks were done for safety.   R:Pt attends groups and interacts well with peers and staff. Pt is taking medication. Pt has no complaints at this time .Pt receptive to treatment and safety maintained on unit.

## 2015-06-08 ENCOUNTER — Encounter: Payer: Self-pay | Admitting: Internal Medicine

## 2015-06-08 DIAGNOSIS — F332 Major depressive disorder, recurrent severe without psychotic features: Secondary | ICD-10-CM | POA: Diagnosis not present

## 2015-06-08 NOTE — Progress Notes (Signed)
Patient ID: Andre [name], male   DOB: May 14, 1985, 30 y.o.   MRN: [id number]  PER STATE REGULATIONS 482.30  THIS CHART WAS REVIEWED FOR MEDICAL NECESSITY WITH RESPECT TO THE PATIENT'S ADMISSION/ DURATION OF STAY.  NEXT REVIEW DATE: 06/12/2015  Willa Rough, RN, BSN CASE MANAGER

## 2015-06-08 NOTE — Plan of Care (Signed)
Problem: Alteration in mood Goal: STG-Patient is able to discuss feelings and issues (Patient is able to discuss feelings and issues leading to depression)  Outcome: Progressing Pt verbalized decreased depression and increased anxiety this morning. Pt reported feeling anxious d/t discharging home tomorrow.

## 2015-06-08 NOTE — [facility] Group Notes (Signed)
[facility] LCSW Aftercare Discharge Planning Group Note  06/08/2015 8:45 AM  Participation Quality: Alert, Appropriate and Oriented  Mood/Affect: Appropriate  Depression Rating: "low"  Anxiety Rating: "high"  Thoughts of Suicide: Pt denies SI/HI  Will you contract for safety? Yes  Current AVH: Pt denies  Plan for Discharge/Comments: Pt attended discharge planning group and actively participated in group. CSW discussed suicide prevention education with the group and encouraged them to discuss discharge planning and any relevant barriers. Pt reports he is feeling better today but describes that he is anxious about medication changes.  Transportation Means: Pt reports access to transportation  Supports: No supports mentioned at this time  Andre [name], Theresia Majors 06/08/2015 9:33 AM

## 2015-06-08 NOTE — [facility] Group Notes (Signed)
[facility] Group Notes:  (Nursing/MHT/Case Management/Adjunct)  Date:  06/08/2015  Time:  8:35 PM  Type of Therapy:  Psychoeducational Skills  Participation Level:  Active  Participation Quality:  Appropriate  Affect:  Appropriate  Cognitive:  Alert  Insight:  Appropriate  Engagement in Group:  Engaged  Modes of Intervention:  Discussion and Education  Summary of Progress/Problems:  Pt participated in wrap up group. Pt rated his day a 8/10. Pt said his day hit a road bump after he spoke to his mother, but he stayed positive by writing poetry. Pt's goal tomorrow is to have a successful discharge.    Karren Cobble 06/08/2015, 8:35 PM

## 2015-06-08 NOTE — [facility] Group Notes (Signed)
[facility] LCSW Group Therapy  06/08/2015 1:15pm  Type of Therapy:  Group Therapy vercoming Obstacles  Participation Level:  Intermittent  Participation Quality:  Appropriate   Affect:  Lethargic  Cognitive:  Appropriate and Oriented  Insight:  Developing/Improving and Improving  Engagement in Therapy:  Improving  Modes of Intervention:  Discussion, Exploration, Problem-solving and Support  Description of Group:   In this group patients will be encouraged to explore what they see as obstacles to their own wellness and recovery. They will be guided to discuss their thoughts, feelings, and behaviors related to these obstacles. The group will process together ways to cope with barriers, with attention given to specific choices patients can make. Each patient will be challenged to identify changes they are motivated to make in order to overcome their obstacles. This group will be process-oriented, with patients participating in exploration of their own experiences as well as giving and receiving support and challenge from other group members.  Summary of Patient Progress: Pt was observed to be sleeping during parts of the group session. He did however identify anxiety as an emotion that he experiences when facing an obstacle. He was able to offer suggestions to peers regarding how to change perspective on difficult challenges.   Therapeutic Modalities:   Cognitive Behavioral Therapy Solution Focused Therapy Motivational Interviewing Relapse Prevention Therapy   Chad Cordial, LCSWA 06/08/2015 2:13 PM

## 2015-06-08 NOTE — Progress Notes (Addendum)
Patient ID: Andre [name], male   DOB: [birth year], 30 y.o.   MRN: [id number] [facility] MD Progress Note  06/08/2015 1:06 PM Andre [name]  MRN:  [id number] Subjective:   Reports some improvement of mood compared to admission, states he tends to have chronic symptoms of depression due to the chronicity of him medical illness and GI pain, but reports he feels he is having more " good days " now. As he improves, he is focusing more on being discharged soon.   Objective : I have discussed case with treatment team and have met with patient . Presents with improved mood and improving range of affect. Smiles more often, although affect does tend to be constricted . Denies suicidal ideations at this time. Acknowledges improving mood, less depression, reports significant sense of anxiety.  Visible in milieu, going to groups, interactive with peers, visible in day room . Pleasant , behavior in good control . Currently denies medication side effects. Principal Problem:  MDD  Diagnosis:   Patient Active Problem List   Diagnosis Date Noted  . Suicidal ideations [R45.851] 05/31/2015  . Exacerbation of Crohn's disease ([place]) [K50.90] 05/27/2015  . Hypokalemia [E87.6] 05/27/2015  . Crohn's disease of both small and large intestine without complication ([place]) [Y65.99]   . LLQ abdominal pain [R10.32]   . 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 Chronic back pain greater than 3 months duration [M54.9, G89.29] 11/17/2014  . Tobacco dependence [F17.200] 11/17/2014  . Abdominal pain [R10.9] 11/05/2014  . Metabolic acidosis, increased anion gap (IAG) [E87.2] 11/05/2014  . Crohn's colitis ([place]) [K50.10] 10/26/2014  . Acute hypokalemia [E87.6] 10/26/2014  . Acute hyperglycemia [R73.09] 10/26/2014  . Refractory nausea and vomiting [R11.2] 10/26/2014  . Dehydration, moderate [E86.0] 10/26/2014  . Microcytic anemia [D50.9] 10/26/2014  . Bipolar affective disorder ([place]) [F31.9] 10/26/2014  . Anxiety disorder [F41.9] 10/26/2014  .  Acute Crohn's disease ([place]) [K50.90] 10/26/2014   Total Time spent with patient: 20 minutes    Past Medical History:  Past Medical History  Diagnosis Date  . Anxiety   . Bipolar depression ([place])   . Crohn disease ([place]) 1997  . Anemia     Around 2013 required PRBC transfusions. Has received parenteral iron in past. Has taken oral iron in past.    Past Surgical History  Procedure Laterality Date  . Subtotal colectomy      Has undergone a total of 3 separate bowel resections including terminal ileal resection and the equivalent of subtotal colectomy. Last surgery was in 2010.  Marland Kitchen Flexible sigmoidoscopy N/A 10/29/2014    Procedure: FLEXIBLE SIGMOIDOSCOPY;  Surgeon: Manus Gunning, MD;  Location: [facility];  Service: Gastroenterology;  Laterality: N/A;   Family History:  Family History  Problem Relation Age of Onset  . Hypertension Father     Social History:  History  Alcohol Use No     History  Drug Use No    Social History   Social History  . Marital Status: Single    Spouse Name: N/A  . Number of Children: N/A  . Years of Education: N/A   Social History Main Topics  . Smoking status: Current Every Day Smoker -- 0.50 packs/day for 5 years  . Smokeless tobacco: Never Used  . Alcohol Use: No  . Drug Use: No  . Sexual Activity: Not Currently   Other Topics Concern  . None   Social History Narrative   Additional Social History:   Sleep:  Improved    Appetite:  Improved  Current Medications: Current Facility-Administered Medications  Medication Dose Route Frequency Provider Last Rate Last Dose  . acetaminophen (TYLENOL) tablet 650 mg  650 mg Oral Q6H PRN Nanci Pina, FNP      . azaTHIOprine (IMURAN) tablet 175 mg  175 mg Oral Q breakfast Nicholaus Bloom, MD   175 mg at 06/08/15 0826  . gabapentin (NEURONTIN) capsule 800 mg  800 mg Oral TID Nicholaus Bloom, MD   800 mg at 06/08/15 1202  . lamoTRIgine (LAMICTAL) tablet 25 mg  25 mg Oral BID Jenne Campus, MD   25 mg at 06/08/15 0827  . loperamide (IMODIUM) capsule 2 mg  2 mg Oral PRN Nicholaus Bloom, MD   2 mg at 06/06/15 1818  . LORazepam (ATIVAN) tablet 0.5 mg  0.5 mg Oral Q12H PRN Jenne Campus, MD   0.5 mg at 06/08/15 0826  . magnesium hydroxide (MILK OF MAGNESIA) suspension 30 mL  30 mL Oral Daily PRN Nanci Pina, FNP      . nicotine (NICODERM CQ - dosed in mg/24 hours) patch 21 mg  21 mg Transdermal Daily Jenne Campus, MD   21 mg at 06/08/15 0827  . ondansetron (ZOFRAN-ODT) disintegrating tablet 8 mg  8 mg Oral Q8H PRN Nicholaus Bloom, MD   8 mg at 06/08/15 1010  . oxyCODONE-acetaminophen (PERCOCET/ROXICET) 5-325 MG per tablet 1 tablet  1 tablet Oral Q4H PRN Nicholaus Bloom, MD   1 tablet at 06/08/15 1010   And  . oxyCODONE (Oxy IR/ROXICODONE) immediate release tablet 5 mg  5 mg Oral Q4H PRN Nicholaus Bloom, MD   5 mg at 06/08/15 1010  . prazosin (MINIPRESS) capsule 1 mg  1 mg Oral QHS Jenne Campus, MD   1 mg at 06/07/15 2230  . predniSONE (DELTASONE) tablet 20 mg  20 mg Oral Q breakfast Nicholaus Bloom, MD   20 mg at 06/08/15 0827  . sertraline (ZOLOFT) tablet 50 mg  50 mg Oral Daily Jenne Campus, MD   50 mg at 06/08/15 0827    Lab Results:  No results found for this or any previous visit (from the past 59 hour(s)).  Blood Alcohol level:  Lab Results  Component Value Date   ETH <5 05/31/2015    Physical Findings: AIMS: Facial and Oral Movements Muscles of Facial Expression: None, normal Lips and Perioral Area: None, normal Jaw: None, normal Tongue: None, normal,Extremity Movements Upper (arms, wrists, hands, fingers): None, normal Lower (legs, knees, ankles, toes): None, normal, Trunk Movements Neck, shoulders, hips: None, normal, Overall Severity Severity of abnormal movements (highest score from questions above): None, normal Incapacitation due to abnormal movements: None, normal Patient's awareness of abnormal movements (rate only patient's report): No  Awareness, Dental Status Current problems with teeth and/or dentures?: No Does patient usually wear dentures?: No  CIWA:    COWS:     Musculoskeletal: Strength & Muscle Tone: within normal limits Gait & Station: normal Patient leans: N/A  Psychiatric Specialty Exam: ROS chronic diarrhea, chronic abdominal discomfort, chronic open wound on laparotomy scar- denies discharge , fever, or chills , denies lower GI bleeding at this time   Blood pressure 123/80, pulse 80, temperature 98 F (36.7 C), temperature source Oral, resp. rate 18, height 5' 2"  (1.575 m), weight 153 lb (69.4 kg), SpO2 99 %.Body mass index is 27.98 kg/(m^2).  General Appearance: improved grooming   Eye Contact::  Good  Speech:  Normal Rate  Volume:  Normal  Mood:   Mood gradually improving   Affect:  Still constricted but more reactive   Thought Process:  Linear  Orientation:  Full (Time, Place, and Person)  Thought Content:  no hallucinations, no delusions   Suicidal Thoughts:  No denies suicidal or self injurious ideations   Homicidal Thoughts:  No  Memory:  recent and remote grossly intact   Judgement:  Improving   Insight:  Present  Psychomotor Activity:  Normal  Concentration:  Good  Recall:  Good  Fund of Knowledge:Good  Language: Good  Akathisia:  Negative  Handed:  Right  AIMS (if indicated):     Assets:  Communication Skills Desire for Improvement Resilience  ADL's:  Intact  Cognition: WNL  Sleep:  Number of Hours: 6.25  Assessment -patient's mood improving gradually, partially, but overall mood and range of affect clearly improved compared to admission . At this time no SI, and is more future oriented, focusing more on discharge planning . For example, focuses on importance of getting outpatient GI specialist management in [place] area, since he recently relocated from [place] and does not yet have established specialist locally. Tolerating medications well.  Treatment Plan Summary: Daily contact  with patient to assess and evaluate symptoms and progress in treatment, Medication management, Plan inpatient treatment and medications as below  Encourage group and milieu participation to work on coping skills  And symptom reduction Continue  Lamictal   25 mgrs BID  for depression Continue Neurontin 800 mgrs TID for anxiety, pain Continue Ativan 0.5 mgrs Q 12 hours PRN for anxiety if needed  Continue  Minipress 1 mgr QHS for PTSD related nightmares  Continue  Zoloft 50 mgrs QDAY for depression Treatment team working on disposition planning  Continue management with Imuran and Prednisone for Chron's D- patient states he has been on these medications for several years.   Neita Garnet, MD 06/08/2015, 1:06 PM

## 2015-06-08 NOTE — Progress Notes (Signed)
D: Pt presents with flat affect and depressed mood. Pt rates depression 3/10. Anxiety 8/10. Pt requested Ativan for increased anxiety. Pt stated that he's anxious about returning home tomorrow. Pt denies suicidal thoughts this morning. Pt c/o abd pain and requested prn pain med. Med given at pt request per order.  A: Medications reviewed with pt. Medications administered as ordered per MD. Verbal support provided. Pt encouraged to attend groups.15 minute checks performed for safety.  R: Pt stated goal "work on discharge plans". Pt receptive to tx.

## 2015-06-09 DIAGNOSIS — F3132 Bipolar disorder, current episode depressed, moderate: Secondary | ICD-10-CM | POA: Diagnosis not present

## 2015-06-09 MED ORDER — SERTRALINE HCL 50 MG PO TABS: 50.0000 mg | ORAL_TABLET | Freq: Every day | ORAL | Status: DC

## 2015-06-09 MED ORDER — PRAZOSIN HCL 1 MG PO CAPS: 1.0000 mg | ORAL_CAPSULE | Freq: Every day | ORAL | Status: DC

## 2015-06-09 MED ORDER — LAMOTRIGINE 25 MG PO TABS: 25.0000 mg | ORAL_TABLET | Freq: Two times a day (BID) | ORAL | Status: DC

## 2015-06-09 MED ORDER — PREDNISONE 20 MG PO TABS: 20.0000 mg | ORAL_TABLET | Freq: Every day | ORAL | Status: DC

## 2015-06-09 MED ORDER — AZATHIOPRINE 50 MG PO TABS: 175.0000 mg | ORAL_TABLET | Freq: Every day | ORAL | Status: DC

## 2015-06-09 MED ORDER — OXYCODONE-ACETAMINOPHEN 10-325 MG PO TABS: 1.0000 | ORAL_TABLET | ORAL | Status: DC | PRN

## 2015-06-09 MED ORDER — GABAPENTIN 400 MG PO CAPS: 800.0000 mg | ORAL_CAPSULE | Freq: Three times a day (TID) | ORAL | Status: DC

## 2015-06-09 MED ORDER — NICOTINE 21 MG/24HR TD PT24: 21.0000 mg | MEDICATED_PATCH | Freq: Every day | TRANSDERMAL | Status: DC

## 2015-06-09 MED ORDER — LORAZEPAM 0.5 MG PO TABS: 0.5000 mg | ORAL_TABLET | Freq: Two times a day (BID) | ORAL | Status: DC | PRN

## 2015-06-09 NOTE — Progress Notes (Signed)
Patient ID: Andre [name], male   DOB: Oct 04, 1985, 30 y.o.   MRN: [id number] Adult Psychoeducational Group Note  Date:  06/09/2015 Time:  08:45am  Group Topic/Focus:  Self Esteem Action Plan:   The focus of this group is to help patients create a plan to continue to build self-esteem after discharge.  Participation Level:  Active  Participation Quality:  Appropriate  Affect:  Blunted  Cognitive:  Appropriate  Insight: Appropriate  Engagement in Group:  Improving  Modes of Intervention:  Activity, Discussion, Education and Support  Additional Comments: Focus on understanding different perspectives. Pt able to identify one positive characteristic about themselves.   Aurora Mask 06/09/2015, 10:15 AM

## 2015-06-09 NOTE — Progress Notes (Signed)
  [facility] Adult Case Management Discharge Plan :  Will you be returning to the same living situation after discharge:  Yes,  Pt returning to his apartment At discharge, do you have transportation home?: Yes,  Pt friend to pick up Do you have the ability to pay for your medications: Yes,  Pt provided with prescriptions  Release of information consent forms completed and in the chart;  Patient's signature needed at discharge.  Patient to Follow up at: Follow-up Information    Follow up with [facility] On 07/03/2015.   Why:  at 10:30am for medication management with Leone Payor.   Contact information:   [address] [place] [address] [phone]      Follow up with [facility] On 06/16/2015.   Why:  at 11:00am with Judithe Modest. If this appointment time does not work for you. If you need to cancel or reschedule, please provide at least 24hr notice to avoid being charged for the appointment.   Contact information:   [address] [place] [address] [phone]      Follow up with [facility] On 08/04/2015.   Why:  Appointment with Dr. Marina Goodell on Tuesday June 13th at 9:30am. Call office if you need to reschedule.    Contact information:   [address] [place], [place] [address] Phone: [phone]      Next level of care provider has access to [facility] Link:no  Safety Planning and Suicide Prevention discussed: Yes,  with roommate; see SPE note for further details  Have you used any form of tobacco in the last 30 days? (Cigarettes, Smokeless Tobacco, Cigars, and/or Pipes): Yes  Has patient been referred to the Quitline?: Patient refused referral  Patient has been referred for addiction treatment: Yes  Elaina Hoops 06/09/2015, 8:32 AM

## 2015-06-09 NOTE — Discharge Summary (Signed)
Physician Discharge Summary Note  Patient:  Andre [name] is an 30 y.o., male MRN:  [id number] DOB:  [birth year] Patient phone:  [phone] (home)  Patient address:   [address] [place] [address],  Total Time spent with patient: Greater than 30 minutes  Date of Admission:  05/31/2015  Date of Discharge: 06-09-15  Reason for Admission: Worsening symptoms of depression.  Principal Problem: Bipolar affective disorder Andre [name])  Discharge Diagnoses: Patient Active Problem List   Diagnosis Date Noted  . Suicidal ideations [R45.851] 05/31/2015  . Exacerbation of Crohn's disease ([facility]) [K50.90] 05/27/2015  . Hypokalemia [E87.6] 05/27/2015  . Crohn's disease of both small and large intestine without complication ([facility]) [K50.80]   . LLQ abdominal pain [R10.32]   . 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 Chronic back pain greater than 3 months duration [M54.9, G89.29] 11/17/2014  . Tobacco dependence [F17.200] 11/17/2014  . Abdominal pain [R10.9] 11/05/2014  . Metabolic acidosis, increased anion gap (IAG) [E87.2] 11/05/2014  . Crohn's colitis ([facility]) [K50.10] 10/26/2014  . Acute hypokalemia [E87.6] 10/26/2014  . Acute hyperglycemia [R73.09] 10/26/2014  . Refractory nausea and vomiting [R11.2] 10/26/2014  . Dehydration, moderate [E86.0] 10/26/2014  . Microcytic anemia [D50.9] 10/26/2014  . Bipolar affective disorder ([facility]) [F31.9] 10/26/2014  . Anxiety disorder [F41.9] 10/26/2014  . Acute Crohn's disease ([facility]) [K50.90] 10/26/2014   Past Psychiatric History: Bipolar affective disorder  Past Medical History:  Past Medical History  Diagnosis Date  . Anxiety   . Bipolar depression ([facility])   . Crohn disease ([facility]) 1997  . Anemia     Around 2013 required PRBC transfusions. Has received parenteral iron in past. Has taken oral iron in past.    Past Surgical History  Procedure Laterality Date  . Subtotal colectomy      Has undergone a total of 3 separate bowel resections including terminal  ileal resection and the equivalent of subtotal colectomy. Last surgery was in 2010.  Marland Kitchen Flexible sigmoidoscopy N/A 10/29/2014    Procedure: FLEXIBLE SIGMOIDOSCOPY;  Surgeon: Andre Frederick, MD;  Location: [name];  Service: Gastroenterology;  Laterality: N/A;   Family History:  Family History  Problem Relation Age of Onset  . Hypertension Father    Family Psychiatric  History: See H&P  Social History:  History  Alcohol Use No     History  Drug Use No    Social History   Social History  . Marital Status: Single    Spouse Name: N/A  . Number of Children: N/A  . Years of Education: N/A   Social History Main Topics  . Smoking status: Current Every Day Smoker -- 0.50 packs/day for 5 years  . Smokeless tobacco: Never Used  . Alcohol Use: No  . Drug Use: No  . Sexual Activity: Not Currently   Other Topics Concern  . None   Social History Narrative   Hospital Course: 30 years old man, who hasa history of Chron's Disease. States it has been worsening over the last year, with increased GI symptoms, increased pain, and increased disruption of his daily life . Thishas caused him to feel more depressed, and with a sense of hopelessness. He has been having suicidal ideations recently, with thoughts of overdosing. No recent suicidal attempts. States he told his roommate about his depression and suicidal ideations and was brought to the hospital. Endorses neuro-vegetative symptoms of depression as below .  Andre [name] was admitted to the hospital for worsening symptoms of depression triggering suicidal ideations. He stated on admisson  that his symptoms has been worsening in the last 12 months. He cited worsening chronic medical condition (Crohn's Ds) as the reason for the worsening symptoms. He was having thoughts of overdosing on medications. After his admission assessment, Andre [name]'s presenting symptoms were identified. The medication regimen targeting those symptoms were initiated. He  was medicated & discharged on; Sertraline 50 mg for depression, Lamictal 25 mg for mood stabilization, Lorazepam 0.5 mg for severe anxiety, Prazosin 1 mg for nightmares  & Hydroxyzine 25 mg prn for anxiety. He presented other pre-existing medical problems that required treatment. He was resumed & discharged on all his pertinent home medications for those health issues. He tolerated his treatment regimen without any adverse effects or reactions reported. Andre [name] was enrolled & participated in the group counseling sessions being offered & held on this unit. He learned coping skills..  During the course of his hospitalization, Andre [name]'s improvement was monitored by observation & his daily report of symptom reduction noted. His emotional & mental status were monitored by daily self-inventory reports completed by him & the clinical staff. He was evaluated by the treatment team for stability & plans for continued recovery after discharge. His motivation was an integral factor in his mood stability. Hee was offered further treatment option upon discharge to continue psychiatric care/medication management to maintain mood stability. Upon his hospital discharge, Andre [name] was both mentally & medically stable. He denies suicidal/homicidal ideations, auditory/visual/tactile hallucinations, delusional thoughts or paranoia. He left [facility] with all belongings in no distress. Transportation per friend.  Physical Findings: AIMS: Facial and Oral Movements Muscles of Facial Expression: None, normal Lips and Perioral Area: None, normal Jaw: None, normal Tongue: None, normal,Extremity Movements Upper (arms, wrists, hands, fingers): None, normal Lower (legs, knees, ankles, toes): None, normal, Trunk Movements Neck, shoulders, hips: None, normal, Overall Severity Severity of abnormal movements (highest score from questions above): None, normal Incapacitation due to abnormal movements: None, normal Patient's awareness of abnormal  movements (rate only patient's report): No Awareness, Dental Status Current problems with teeth and/or dentures?: No Does patient usually wear dentures?: No  CIWA:    COWS:     Musculoskeletal: Strength & Muscle Tone: within normal limits Gait & Station: normal Patient leans: N/A  Psychiatric Specialty Exam: Review of Systems  Constitutional: Negative.   Eyes: Negative.   Cardiovascular: Negative.   Gastrointestinal: Negative.   Genitourinary: Negative.   Musculoskeletal: Negative.   Skin: Negative.   Neurological: Negative.   Endo/Heme/Allergies: Negative.   Psychiatric/Behavioral: Positive for depression (Stable). Negative for suicidal ideas, hallucinations, memory loss and substance abuse. The patient has insomnia (Stable). The patient is not nervous/anxious.     Blood pressure 128/82, pulse 78, temperature 98.1 F (36.7 C), temperature source Oral, resp. rate 16, height 5\' 2"  (1.575 m), weight 69.4 kg (153 lb), SpO2 99 %.Body mass index is 27.98 kg/(m^2).  See Md's SRA   Have you used any form of tobacco in the last 30 days? (Cigarettes, Smokeless Tobacco, Cigars, and/or Pipes): Yes  Has this patient used any form of tobacco in the last 30 days? (Cigarettes, Smokeless Tobacco, Cigars, and/or Pipes) Yes, Provided with nicotine patch prescription.  Blood Alcohol level:  Lab Results  Component Value Date   ETH <5 05/31/2015    Metabolic Disorder Labs:  Lab Results  Component Value Date   HGBA1C 5.5 10/27/2014   MPG 111 10/27/2014   No results found for: PROLACTIN No results found for: CHOL, TRIG, HDL, CHOLHDL, VLDL, LDLCALC  See Psychiatric Specialty Exam and  Suicide Risk Assessment completed by Attending Physician prior to discharge.  Discharge destination:  Home  Is patient on multiple antipsychotic therapies at discharge:  No   Has Patient had three or more failed trials of antipsychotic monotherapy by history:  No  Recommended Plan for Multiple Antipsychotic  Therapies: NA    Medication List    STOP taking these medications        ALPRAZolam 0.5 MG tablet  Commonly known as:  XANAX     amitriptyline 50 MG tablet  Commonly known as:  ELAVIL     cyanocobalamin 1000 MCG tablet     ferrous sulfate 325 (65 FE) MG tablet     gabapentin 800 MG tablet  Commonly known as:  NEURONTIN  Replaced by:  gabapentin 400 MG capsule     magnesium oxide 400 (241.3 Mg) MG tablet  Commonly known as:  MAG-OX     ondansetron 4 MG disintegrating tablet  Commonly known as:  ZOFRAN ODT      TAKE these medications      Indication   azaTHIOprine 50 MG tablet  Commonly known as:  IMURAN  Take 3.5 tablets (175 mg total) by mouth daily. Crohn's disease   Indication:  Rheumatoid Arthritis     gabapentin 400 MG capsule  Commonly known as:  NEURONTIN  Take 2 capsules (800 mg total) by mouth 3 (three) times daily. For agitation   Indication:  Agitation     lamoTRIgine 25 MG tablet  Commonly known as:  LAMICTAL  Take 1 tablet (25 mg total) by mouth 2 (two) times daily. For mood stabilization   Indication:  Mood stabilization     LORazepam 0.5 MG tablet  Commonly known as:  ATIVAN  Take 1 tablet (0.5 mg total) by mouth every 12 (twelve) hours as needed for anxiety.   Indication:  Feeling Anxious     nicotine 21 mg/24hr patch  Commonly known as:  NICODERM CQ - dosed in mg/24 hours  Place 1 patch (21 mg total) onto the skin daily. For nicotine   Indication:  Nicotine Addiction     oxyCODONE-acetaminophen 10-325 MG tablet  Commonly known as:  PERCOCET  Take 1 tablet by mouth every 4 (four) hours as needed for pain.   Indication:  Pain     prazosin 1 MG capsule  Commonly known as:  MINIPRESS  Take 1 capsule (1 mg total) by mouth at bedtime. For nightmares   Indication:  Nightmares     predniSONE 20 MG tablet  Commonly known as:  DELTASONE  Take 1 tablet (20 mg total) by mouth daily with breakfast. For inflammation associated with Crohn's Disease    Indication:  Inflammation, Crohn's Ds     sertraline 50 MG tablet  Commonly known as:  ZOLOFT  Take 1 tablet (50 mg total) by mouth daily. For depression   Indication:  Major Depressive Disorder       Follow-up Information    Follow up with [name] On 07/03/2015.   Why:  at 10:30am for medication management with Leone Payor.   Contact information:   [address] [place] [address] [phone]      Follow up with [facility] On 06/16/2015.   Why:  at 11:00am with Judithe Modest. If this appointment time does not work for you. If you need to cancel or reschedule, please provide at least 24hr notice to avoid being charged for the appointment.   Contact information:  [address] [place] [address] [phone]      Follow up with [facility] On 08/04/2015.   Why:  Appointment with Dr. Marina Goodell on Tuesday June 13th at 9:30am. Call office if you need to reschedule.    Contact information:   [address] [place], [place], [place] [address] Phone: [phone]     Follow-up recommendations: Activity:  As tolerated Diet: As recommended by your primary care doctor. Keep all scheduled follow-up appointments as recommended.   Comments: Take all your medications as prescribed by your mental healthcare provider. Report any adverse effects and or reactions from your medicines to your outpatient provider promptly. Patient is instructed and cautioned to not engage in alcohol and or illegal drug use while on prescription medicines. In the event of worsening symptoms, patient is instructed to call the crisis hotline, 911 and or go to the nearest ED for appropriate evaluation and treatment of symptoms. Follow-up with your primary care provider for your other medical issues, concerns and or health care needs.   Signed: Sanjuana Kava, NP, PMHNP-BC 06/10/2015, 2:12 PM  I personally assessed the patient and formulated the plan Madie Ren[place]  A. Dub Mikes, M.D.

## 2015-06-09 NOTE — Progress Notes (Signed)
Recreation Therapy Notes  Animal-Assisted Activity (AAA) Program Checklist/Progress Notes Patient Eligibility Criteria Checklist & Daily Group note for Rec Tx Intervention  Date: 06/09/15 Time: 1445 Location: [address]   AAA/T Program Assumption of Risk Form signed by Patient/ or Parent Legal Guardian yes  Patient is free of allergies or sever asthma yes  Patient reports no fear of animals yes  Patient reports no history of cruelty to animalsyes  Patient understands his/her participation is voluntary yes  Patient washes hands before animal contact yes  Patient washes hands after animal contact yes  Education: Hand Washing, Appropriate Animal Interaction   Education Outcome: Acknowledges understanding/In group clarification offered/Needs additional education.   Clinical Observations/Feedback: Pt discharged  Mignonne Afonso Lillia Abed, LRT/CTRS  Caroll Rancher A 06/09/2015 4:08 PM

## 2015-06-09 NOTE — Progress Notes (Signed)
Discharge note: Pt received both written and verbal discharge instructions. Pt verbalized understanding of discharge instructions. Pt agreed to f/u appt and med regimen. Writer informed pt to contact out pt services if a sooner f/u appt is needed. Pt received prescriptions, SRA, AVS and transition record. Pt received belongings from room and locker. Pt safely discharged to lobby.
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
## 2015-06-09 NOTE — Tx Team (Signed)
Interdisciplinary Treatment Plan Update (Adult) Date: 06/09/2015   Date: 06/09/2015 8:30 AM  Progress in Treatment:  Attending groups: Yes  Participating in groups: Yes Taking medication as prescribed: Yes  Tolerating medication: Yes  Family/Significant othe contact made: Yes with roomate Patient understands diagnosis: Yes AEB seeking help with depression Discussing patient identified problems/goals with staff: Yes  Medical problems stabilized or resolved: Yes  Denies suicidal/homicidal ideation: Yes Patient has not harmed self or Others: Yes   New problem(s) identified: None identified at this time.   Discharge Plan or Barriers: Pt will return home and follow-up with outpatient providers  Additional comments:  Patient and CSW reviewed pt's identified goals and treatment plan. Patient verbalized understanding and agreed to treatment plan. CSW reviewed [facility] "Discharge Process and Patient Involvement" Form. Pt verbalized understanding of information provided and signed form.   Reason for Continuation of Hospitalization:  Anxiety Depression Medication stabilization Suicidal ideation  Estimated length of stay: 0 days  Review of initial/current patient goals per problem list:   1.  Goal(s): Patient will participate in aftercare plan  Met:  Yes  Target date: 3-5 days from date of admission   As evidenced by: Patient will participate within aftercare plan AEB aftercare provider and housing plan at discharge being identified.   06/01/15: Pt will return home and follow-up with outpatient providers  2.  Goal (s): Patient will exhibit decreased depressive symptoms and suicidal ideations.  Met:  Yes  Target date: 3-5 days from date of admission   As evidenced by: Patient will utilize self rating of depression at 3 or below and demonstrate decreased signs of depression or be deemed stable for discharge by MD. 06/01/15: Pt was admitted with symptoms of depression, rating 10/10. Pt  continues to present with flat affect and depressive symptoms.  Pt will demonstrate decreased symptoms of depression and rate depression at 3/10 or lower prior to discharge. 06/05/15: Pt rates depression at 3/10; denies SI   3.  Goal(s): Patient will demonstrate decreased signs and symptoms of anxiety.  Met:  Adequate for DC  Target date: 3-5 days from date of admission   As evidenced by: Patient will utilize self rating of anxiety at 3 or below and demonstrated decreased signs of anxiety, or be deemed stable for discharge by MD 06/01/15: Pt was admitted with increased levels of anxiety and is currently rating those symptoms highly. Pt will demonstrated decreased symptoms of anxiety and rate it at 3/10 prior to d/c. 06/05/15: Pt reports improvement in anxiety symptoms. Will continue to monitor 06/09/2015: MD feels that Pt's symptoms have decreased to the point that they can be managed in an outpatient setting.   Attendees:  Patient:    Family:    Physician: Dr. Sabra Heck, MD 06/09/2015 8:30 AM  Nursing:  06/09/2015 8:30 AM  Clinical Social Worker Peri Maris, [place] 06/09/2015 8:30 AM  Other: Erasmo Downer Drinkard, LCSWA 06/09/2015 8:30 AM  Clinical: Marcella Dubs  RN; Darrol Angel, RN 06/09/2015 8:30 AM  Other: , RN Charge Nurse 06/09/2015 8:30 AM  Other:     Peri Maris, [place] Social Work [phone]

## 2015-06-13 ENCOUNTER — Encounter (HOSPITAL_BASED_OUTPATIENT_CLINIC_OR_DEPARTMENT_OTHER): Payer: Self-pay

## 2015-06-13 ENCOUNTER — Emergency Department (HOSPITAL_BASED_OUTPATIENT_CLINIC_OR_DEPARTMENT_OTHER)
Admission: EM | Admit: 2015-06-13 | Discharge: 2015-06-13 | Disposition: A | Discharge status: Home / Self Care | Payer: Medicare Other | Source: Home / Self Care | Attending: Emergency Medicine | Admitting: Emergency Medicine

## 2015-06-13 DIAGNOSIS — F3130 Bipolar disorder, current episode depressed, mild or moderate severity, unspecified: Secondary | ICD-10-CM

## 2015-06-13 DIAGNOSIS — K5090 Crohn's disease, unspecified, without complications: Secondary | ICD-10-CM | POA: Diagnosis present

## 2015-06-13 DIAGNOSIS — Z79899 Other long term (current) drug therapy: Secondary | ICD-10-CM | POA: Insufficient documentation

## 2015-06-13 DIAGNOSIS — E876 Hypokalemia: Secondary | ICD-10-CM | POA: Diagnosis present

## 2015-06-13 DIAGNOSIS — G8929 Other chronic pain: Secondary | ICD-10-CM | POA: Diagnosis present

## 2015-06-13 DIAGNOSIS — Z881 Allergy status to other antibiotic agents status: Secondary | ICD-10-CM | POA: Diagnosis not present

## 2015-06-13 DIAGNOSIS — Z885 Allergy status to narcotic agent status: Secondary | ICD-10-CM

## 2015-06-13 DIAGNOSIS — K5080 Crohn's disease of both small and large intestine without complications: Principal | ICD-10-CM | POA: Diagnosis present

## 2015-06-13 DIAGNOSIS — F419 Anxiety disorder, unspecified: Secondary | ICD-10-CM

## 2015-06-13 DIAGNOSIS — Z888 Allergy status to other drugs, medicaments and biological substances status: Secondary | ICD-10-CM

## 2015-06-13 DIAGNOSIS — D649 Anemia, unspecified: Secondary | ICD-10-CM | POA: Diagnosis present

## 2015-06-13 DIAGNOSIS — Z7952 Long term (current) use of systemic steroids: Secondary | ICD-10-CM | POA: Diagnosis not present

## 2015-06-13 DIAGNOSIS — Z862 Personal history of diseases of the blood and blood-forming organs and certain disorders involving the immune mechanism: Secondary | ICD-10-CM

## 2015-06-13 DIAGNOSIS — F17200 Nicotine dependence, unspecified, uncomplicated: Secondary | ICD-10-CM | POA: Diagnosis present

## 2015-06-13 DIAGNOSIS — R45851 Suicidal ideations: Secondary | ICD-10-CM | POA: Diagnosis not present

## 2015-06-13 DIAGNOSIS — F319 Bipolar disorder, unspecified: Secondary | ICD-10-CM | POA: Diagnosis present

## 2015-06-13 DIAGNOSIS — Z9049 Acquired absence of other specified parts of digestive tract: Secondary | ICD-10-CM

## 2015-06-13 HISTORY — DX: Crohn's disease, unspecified, without complications: K50.90

## 2015-06-13 LAB — COMPREHENSIVE METABOLIC PANEL
ALT: 20 U/L (ref 17–63)
AST: 16 U/L (ref 15–41)
Albumin: 4 g/dL (ref 3.5–5.0)
Alkaline Phosphatase: 82 U/L (ref 38–126)
Anion gap: 11 (ref 5–15)
BUN: 7 mg/dL (ref 6–20)
CO2: 21 mmol/L — ABNORMAL LOW (ref 22–32)
Calcium: 8.5 mg/dL — ABNORMAL LOW (ref 8.9–10.3)
Chloride: 109 mmol/L (ref 101–111)
Creatinine, Ser: 0.76 mg/dL (ref 0.61–1.24)
GFR calc Af Amer: >60 mL/min (ref >60)
GFR calc non Af Amer: >60 mL/min (ref >60)
Glucose, Bld: 90 mg/dL (ref 65–99)
Potassium: 3.1 mmol/L — ABNORMAL LOW (ref 3.5–5.1)
Sodium: 141 mmol/L (ref 135–145)
Total Bilirubin: 1.7 mg/dL — ABNORMAL HIGH (ref 0.3–1.2)
Total Protein: 6.6 g/dL (ref 6.5–8.1)

## 2015-06-13 LAB — CBC WITH DIFFERENTIAL/PLATELET
Basophils Absolute: 0.1 10*3/uL (ref 0.0–0.1)
Basophils Relative: 1 %
Eosinophils Absolute: 0.1 10*3/uL (ref 0.0–0.7)
Eosinophils Relative: 1 %
HCT: 35.2 % — ABNORMAL LOW (ref 39.0–52.0)
Hemoglobin: 11.8 g/dL — ABNORMAL LOW (ref 13.0–17.0)
Lymphocytes Relative: 9 %
Lymphs Abs: 0.8 10*3/uL (ref 0.7–4.0)
MCH: 28 pg (ref 26.0–34.0)
MCHC: 33.5 g/dL (ref 30.0–36.0)
MCV: 83.4 fL (ref 78.0–100.0)
Monocytes Absolute: 0.5 10*3/uL (ref 0.1–1.0)
Monocytes Relative: 6 %
Neutro Abs: 6.9 10*3/uL (ref 1.7–7.7)
Neutrophils Relative %: 83 %
Platelets: 276 10*3/uL (ref 150–400)
RBC: 4.22 MIL/uL (ref 4.22–5.81)
RDW: 16.1 % — ABNORMAL HIGH (ref 11.5–15.5)
WBC: 8.3 10*3/uL (ref 4.0–10.5)

## 2015-06-13 LAB — LIPASE, BLOOD: Lipase: 17 U/L (ref 11–51)

## 2015-06-13 MED ORDER — PROMETHAZINE HCL 12.5 MG RE SUPP: 12.5000 mg | Freq: Four times a day (QID) | RECTAL | Status: DC | PRN

## 2015-06-13 MED ORDER — SODIUM CHLORIDE 0.9 % IV BOLUS (SEPSIS)
1000.0000 mL | Freq: Once | INTRAVENOUS | Status: AC
  Administered 2015-06-13: 1000 mL via INTRAVENOUS

## 2015-06-13 MED ORDER — ONDANSETRON 4 MG PO TBDP: 4.0000 mg | ORAL_TABLET | Freq: Three times a day (TID) | ORAL | Status: DC | PRN

## 2015-06-13 MED ORDER — OXYCODONE-ACETAMINOPHEN 5-325 MG PO TABS: 1.0000 | ORAL_TABLET | ORAL | Status: DC | PRN

## 2015-06-13 MED ORDER — ONDANSETRON HCL 4 MG/2ML IJ SOLN
INTRAMUSCULAR | Status: AC
  Administered 2015-06-13: 8 mg
  Filled 2015-06-13: qty 4

## 2015-06-13 MED ORDER — HYDROMORPHONE HCL 1 MG/ML IJ SOLN
2.0000 mg | Freq: Once | INTRAMUSCULAR | Status: AC
  Administered 2015-06-13: 2 mg via INTRAVENOUS
  Filled 2015-06-13: qty 2

## 2015-06-13 MED ORDER — HYDROMORPHONE HCL 1 MG/ML IJ SOLN
0.5000 mg | Freq: Once | INTRAMUSCULAR | Status: AC
  Administered 2015-06-13: 0.5 mg via INTRAVENOUS
  Filled 2015-06-13: qty 1

## 2015-06-13 MED ORDER — METHYLPREDNISOLONE SODIUM SUCC 125 MG IJ SOLR
125.0000 mg | Freq: Once | INTRAMUSCULAR | Status: AC
  Administered 2015-06-13: 125 mg via INTRAVENOUS
  Filled 2015-06-13: qty 2

## 2015-06-13 MED ORDER — METOCLOPRAMIDE HCL 5 MG/ML IJ SOLN
10.0000 mg | Freq: Once | INTRAMUSCULAR | Status: AC
Start: 2015-06-13 — End: 2015-06-13
  Administered 2015-06-13: 10 mg via INTRAVENOUS
  Filled 2015-06-13: qty 2

## 2015-06-13 MED ORDER — SODIUM CHLORIDE 0.9 % IV SOLN
8.0000 mg | Freq: Once | INTRAVENOUS | Status: DC
  Filled 2015-06-13: qty 4

## 2015-06-13 NOTE — ED Provider Notes (Signed)
CSN: [id number]     Arrival date & time 06/13/15  1511 History   First MD Initiated Contact with Patient 06/13/15 1553     Chief Complaint  Patient presents with  . Abdominal Pain    HPI   30 year old male presents today with complaints of Crohn's flare. Patient has a significant past medical Crohn's disease status post subtotal colectomy who has been maintained on prednisone and Imuran at home. Patient most hospital admission for Crohn's flare with discharge on 05/29/2015. Patient was seen by gastroenterology at that visit with follow-up evaluation scheduled for mid May 2017. Patient reports that he had been feeling well until yesterday morning around 9 AM when he started developing left lower quadrant abdominal pain. He reports associated nausea, vomiting and nonbloody diarrhea. He notes approximately 12 episodes of diarrhea over the last 24 hours, 5 times of vomiting. He reports the vomiting is with attempts at by mouth intake. Patient reports no intake over the last 24 hours. Patient reports trying Tylenol at home for the pain which did not improve his symptoms. Patient also tried using Zofran 8 mg tablets which did not improve. Patient denies any drug or alcohol use, significant changes in diet. Patient denies any fever or chills, upper abdominal pain, respiratory complaints. Patient reports this is typical of his flares.   Past Medical History  Diagnosis Date  . Anxiety   . Bipolar depression (HCC)   . Crohn disease (HCC) 1997  . Anemia     Around 2013 required PRBC transfusions. Has received parenteral iron in past. Has taken oral iron in past.  . Crohn's disease [facility])    Past Surgical History  Procedure Laterality Date  . Subtotal colectomy      Has undergone a total of 3 separate bowel resections including terminal ileal resection and the equivalent of subtotal colectomy. Last surgery was in 2010.  Marland Kitchen Flexible sigmoidoscopy N/A 10/29/2014    Procedure: FLEXIBLE SIGMOIDOSCOPY;   Surgeon: Ruffin Frederick, MD;  Location: [facility] ENDOSCOPY;  Service: Gastroenterology;  Laterality: N/A;   Family History  Problem Relation Age of Onset  . Hypertension Father    Social History  Substance Use Topics  . Smoking status: Current Every Day Smoker -- 0.50 packs/day for 5 years  . Smokeless tobacco: Never Used  . Alcohol Use: No    Review of Systems  All other systems reviewed and are negative.  Allergies  Lithium; Remicade; Compazine; Ketorolac; Morphine; Nsaids; Ciprofloxacin; Haldol; and Metronidazole  Home Medications   Prior to Admission medications   Medication Sig Start Date End Date Taking? Authorizing Provider  azaTHIOprine (IMURAN) 50 MG tablet Take 3.5 tablets (175 mg total) by mouth daily. Crohn's disease 06/09/15   Sanjuana Kava, NP  gabapentin (NEURONTIN) 400 MG capsule Take 2 capsules (800 mg total) by mouth 3 (three) times daily. For agitation 06/09/15   Sanjuana Kava, NP  lamoTRIgine (LAMICTAL) 25 MG tablet Take 1 tablet (25 mg total) by mouth 2 (two) times daily. For mood stabilization 06/09/15   Sanjuana Kava, NP  LORazepam (ATIVAN) 0.5 MG tablet Take 1 tablet (0.5 mg total) by mouth every 12 (twelve) hours as needed for anxiety. 06/09/15   Sanjuana Kava, NP  nicotine (NICODERM CQ - DOSED IN MG/24 HOURS) 21 mg/24hr patch Place 1 patch (21 mg total) onto the skin daily. For nicotine 06/09/15   Sanjuana Kava, NP  ondansetron (ZOFRAN ODT) 4 MG disintegrating tablet Take 1 tablet (4 mg total) by  mouth every 8 (eight) hours as needed for nausea or vomiting. 06/13/15   Eyvonne Mechanic, PA-C  oxyCODONE-acetaminophen (PERCOCET) 5-325 MG tablet Take 1 tablet by mouth every 4 (four) hours as needed for severe pain. 06/13/15   Eyvonne Mechanic, PA-C  prazosin (MINIPRESS) 1 MG capsule Take 1 capsule (1 mg total) by mouth at bedtime. For nightmares 06/09/15   Sanjuana Kava, NP  predniSONE (DELTASONE) 20 MG tablet Take 1 tablet (20 mg total) by mouth daily with breakfast. For  inflammation associated with Crohn's Disease 06/09/15   Sanjuana Kava, NP  promethazine (PHENERGAN) 12.5 MG suppository Place 1 suppository (12.5 mg total) rectally every 6 (six) hours as needed for nausea or vomiting. 06/13/15   Eyvonne Mechanic, PA-C  sertraline (ZOLOFT) 50 MG tablet Take 1 tablet (50 mg total) by mouth daily. For depression 06/09/15   Sanjuana Kava, NP   BP 106/70 mmHg  Pulse 73  Temp(Src) 98.2 F (36.8 C) (Oral)  Resp 18  Ht 5\' 2"  (1.575 m)  Wt 72.576 kg  BMI 29.26 kg/m2  SpO2 98%    Physical Exam  Constitutional: He is oriented to person, place, and time. He appears well-developed and well-nourished. No distress.  Non toxic  HENT:  Head: Normocephalic and atraumatic.  Moist mucous membranes  Eyes: Conjunctivae are normal. Pupils are equal, round, and reactive to light. Right eye exhibits no discharge. Left eye exhibits no discharge. No scleral icterus.  Neck: Normal range of motion. No JVD present. No tracheal deviation present.  Pulmonary/Chest: Effort normal. No stridor.  Abdominal: Soft. He exhibits no distension and no mass. There is tenderness. There is no rebound and no guarding.  LLQ abd pain  Neurological: He is alert and oriented to person, place, and time. Coordination normal.  Skin: Skin is warm and dry. No rash noted. No erythema. No pallor.  Psychiatric: He has a normal mood and affect. His behavior is normal. Judgment and thought content normal.  Nursing note and vitals reviewed.   ED Course  Procedures (including critical care time) Labs Review Labs Reviewed  CBC WITH DIFFERENTIAL/PLATELET - Abnormal; Notable for the following:    Hemoglobin 11.8 (*)    HCT 35.2 (*)    RDW 16.1 (*)    All other components within normal limits  COMPREHENSIVE METABOLIC PANEL - Abnormal; Notable for the following:    Potassium 3.1 (*)    CO2 21 (*)    Calcium 8.5 (*)    Total Bilirubin 1.7 (*)    All other components within normal limits  LIPASE, BLOOD     Imaging Review No results found. I have personally reviewed and evaluated these images and lab results as part of my medical decision-making.   EKG Interpretation None      MDM   Final diagnoses:  Crohn's disease without complication, unspecified gastrointestinal tract location (HCC)    Labs: CBC, CMP, lipase  Imaging:  Consults:  Therapeutics: Dilaudid, Solu-Medrol, Reglan, normal saline, Zofran  Discharge Meds: Promethazine, Zofran, oxycodone  Assessment/Plan: 30 year old male presents today with complaints of Crohn's flare. This is typical of his flares, he appears to be in no acute distress, nontoxic. Patient had an episode of diarrhea here and an episode of vomiting. Patient was given antibiotics, pain medication. Patient reported improvement in his pain symptoms with medications, but noted to have an episode of vomiting after by mouth. Patient's labs were reassuring, afebrile, nontoxic in no apparent distress. Discussion of hospital admission versus outpatient management with  patient, he agreed that trial of steroids, antibiotics, and pain medication at home is reasonable. Patient will be instructed to return to the emergency room if he is unable to tolerate by mouth at home, or has any other concerning signs or symptoms. I have very low suspicion for any significant abdominal pathology including bowel obstruction, diverticulitis, or any other potentially life-threatening illness. Patient states that this is his Crohn's flare, with no concerning signs or symptoms other than typical symptoms.        Eyvonne Mechanic, PA-C 06/14/15 [id number]  Nelva Nay, MD 06/14/15 [phone]

## 2015-06-13 NOTE — ED Notes (Signed)
PAtient reports of left lower abdominal pain with nausea, vomiting and diarrhea since yesterday morning. Patient reports hx of crohns.

## 2015-06-13 NOTE — Discharge Instructions (Signed)
Crohn Disease Crohn disease is a long-lasting (chronic) disease that affects your gastrointestinal (GI) tract. It often causes irritation and swelling (inflammation) in your small intestine and the beginning of your large intestine. However, it can affect any part of your GI tract. Crohn disease is part of a group of illnesses that are known as inflammatory bowel disease (IBD). Crohn disease may start slowly and get worse over time. Symptoms may come and go. They may also disappear for months or even years at a time (remission). CAUSES The exact cause of Crohn disease is not known. It may be a response that causes your body's defense system (immune system) to mistakenly attack healthy cells and tissues (autoimmune response). Your genes and your environment may also play a role. RISK FACTORS You may be at greater risk for Crohn disease if you:  Have other family members with Crohn disease or another IBD.  Use any tobacco products, including cigarettes, chewing tobacco, or electronic cigarettes.  Are in your 20s.  Have Eastern European ancestry. SIGNS AND SYMPTOMS The main signs and symptoms of Crohn disease involve your GI tract. These include:  Diarrhea.  Rectal bleeding.  An urgent need to move your bowels.  The feeling that you are not finished having a bowel movement.  Abdominal pain or cramping.  Constipation. General signs and symptoms of Crohn disease may also include:  Unexplained weight loss.  Fatigue.  Fever.  Nausea.  Loss of appetite.  Joint pain  Changes in vision.  Red bumps on your skin. DIAGNOSIS Your health care provider may suspect Crohn disease based on your symptoms and your medical history. Your health care provider will do a physical exam. You may need to see a health care provider who specializes in diseases of the digestive tract (gastroenterologist). You may also have tests to help your health care providers make a diagnosis. These may  include:  Blood tests.  Stool sample tests.  Imaging tests, such as X-rays and CT scans.  Tests to examine the inside of your intestines using a long, flexible tube that has a light and a camera on the end (endoscopy or colonoscopy).  A procedure to take tissue samples from inside your bowel (biopsy) to be examined under a microscope. TREATMENT  There is no cure for Crohn disease. Treatment will focus on managing your symptoms. Crohn disease affects each person differently. Your treatment may include:  Resting your bowels. Drinking only clear liquids or getting nutrition through an IV for a period of time gives your bowels a chance to heal because they are not passing stools.  Medicines. These may be used alone or in combination (combination therapy). These may include antibiotic medicines. You may be given medicines that help to:  Reduce inflammation.  Control your immune system activity.  Fight infections.  Relieve cramps and prevent diarrhea.  Control your pain.  Surgery. You may need surgery if:  Medicines and other treatments are no longer working.  You develop complications from severe Crohn disease.  A section of your intestine becomes so damaged that it needs to be removed. HOME CARE INSTRUCTIONS  Take medicines only as directed by your health care provider.  If you were prescribed an antibiotic medicine, finish it all even if you start to feel better.  Keep all follow-up visits as directed by your health care provider. This is important.  Talk with your health care provider about changing your diet. This may help your symptoms. Your health care provide may recommend changes, such   as:  Drinking more fluids.  Avoiding milk and other foods that contain lactose.  Eating a low-fat diet.  Avoiding high-fiber foods, such as popcorn and nuts.  Avoiding carbonated beverages, such as soda.  Eating smaller meals more often rather than eating large  meals.  Keeping a food diary to identify foods that make your symptoms better or worse.  Do not use any tobacco products, including cigarettes, chewing tobacco, or electronic cigarettes. If you need help quitting, ask your health care provider.  Limit alcohol intake to no more than 1 drink per day for nonpregnant women and 2 drinks per day for men. One drink equals 12 ounces of beer, 5 ounces of wine, or 1 ounces of hard liquor.  Exercise daily or as directed by your health care provider. SEEK MEDICAL CARE IF:  You have diarrhea, abdominal cramps, and other gastrointestinal problems that are present almost all of the time.  Your symptoms do not improve with treatment.  You continue to lose weight.  You develop a rash or sores on your skin.  You develop eye problems.  You have a fever.   Your symptoms get worse.  You develop new symptoms. SEEK IMMEDIATE MEDICAL CARE IF:  You have bloody diarrhea.  You develop severe abdominal pain.  You cannot pass stools.   This information is not intended to replace advice given to you by your health care provider. Make sure you discuss any questions you have with your health care provider.   Document Released: 11/17/2004 Document Revised: 02/28/2014 Document Reviewed: 09/25/2013 Elsevier Interactive Patient Education 2016 [employer] Inc[employer]  

## 2015-06-14 ENCOUNTER — Encounter (HOSPITAL_BASED_OUTPATIENT_CLINIC_OR_DEPARTMENT_OTHER): Payer: Self-pay | Admitting: *Deleted

## 2015-06-14 ENCOUNTER — Inpatient Hospital Stay (HOSPITAL_BASED_OUTPATIENT_CLINIC_OR_DEPARTMENT_OTHER)
Admission: EM | Admit: 2015-06-14 | Discharge: 2015-06-30 | DRG: 386 | Disposition: A | Discharge status: Home / Self Care | Payer: Medicare Other | Attending: Internal Medicine | Admitting: Internal Medicine

## 2015-06-14 DIAGNOSIS — K50819 Crohn's disease of both small and large intestine with unspecified complications: Secondary | ICD-10-CM | POA: Diagnosis not present

## 2015-06-14 DIAGNOSIS — K5090 Crohn's disease, unspecified, without complications: Secondary | ICD-10-CM | POA: Diagnosis not present

## 2015-06-14 DIAGNOSIS — E876 Hypokalemia: Secondary | ICD-10-CM | POA: Diagnosis not present

## 2015-06-14 DIAGNOSIS — R112 Nausea with vomiting, unspecified: Secondary | ICD-10-CM

## 2015-06-14 DIAGNOSIS — R45851 Suicidal ideations: Secondary | ICD-10-CM

## 2015-06-14 DIAGNOSIS — F419 Anxiety disorder, unspecified: Secondary | ICD-10-CM | POA: Diagnosis present

## 2015-06-14 DIAGNOSIS — R197 Diarrhea, unspecified: Secondary | ICD-10-CM | POA: Diagnosis not present

## 2015-06-14 DIAGNOSIS — R109 Unspecified abdominal pain: Secondary | ICD-10-CM

## 2015-06-14 DIAGNOSIS — F32A Depression, unspecified: Secondary | ICD-10-CM | POA: Diagnosis present

## 2015-06-14 DIAGNOSIS — R1032 Left lower quadrant pain: Secondary | ICD-10-CM | POA: Diagnosis not present

## 2015-06-14 DIAGNOSIS — F17200 Nicotine dependence, unspecified, uncomplicated: Secondary | ICD-10-CM | POA: Diagnosis present

## 2015-06-14 DIAGNOSIS — F329 Major depressive disorder, single episode, unspecified: Secondary | ICD-10-CM | POA: Diagnosis present

## 2015-06-14 DIAGNOSIS — F319 Bipolar disorder, unspecified: Secondary | ICD-10-CM | POA: Diagnosis present

## 2015-06-14 LAB — BASIC METABOLIC PANEL
Anion gap: 15 (ref 5–15)
BUN: 9 mg/dL (ref 6–20)
CHLORIDE: 107 mmol/L (ref 101–111)
CO2: 16 mmol/L — ABNORMAL LOW (ref 22–32)
CREATININE: 0.98 mg/dL (ref 0.61–1.24)
Calcium: 9 mg/dL (ref 8.9–10.3)
Glucose, Bld: 172 mg/dL — ABNORMAL HIGH (ref 65–99)
Potassium: 4.3 mmol/L (ref 3.5–5.1)
SODIUM: 138 mmol/L (ref 135–145)

## 2015-06-14 LAB — SEDIMENTATION RATE: SED RATE: 5 mm/h (ref 0–16)

## 2015-06-14 LAB — C DIFFICILE QUICK SCREEN W PCR REFLEX
C DIFFICILE (CDIFF) INTERP: NEGATIVE
C Diff antigen: NEGATIVE
C Diff toxin: NEGATIVE

## 2015-06-14 LAB — C-REACTIVE PROTEIN: CRP: 1.4 mg/dL — ABNORMAL HIGH (ref <1.0)

## 2015-06-14 LAB — MAGNESIUM: MAGNESIUM: 1.9 mg/dL (ref 1.7–2.4)

## 2015-06-14 MED ORDER — METOCLOPRAMIDE HCL 5 MG/ML IJ SOLN
10.0000 mg | Freq: Once | INTRAMUSCULAR | Status: AC
  Administered 2015-06-14: 10 mg via INTRAVENOUS
  Filled 2015-06-14: qty 2

## 2015-06-14 MED ORDER — SODIUM CHLORIDE 0.9 % IV SOLN
INTRAVENOUS | Status: DC
  Administered 2015-06-14: 10:00:00 via INTRAVENOUS

## 2015-06-14 MED ORDER — HYDROCORTISONE ACETATE 25 MG RE SUPP
25.0000 mg | Freq: Two times a day (BID) | RECTAL | Status: DC
  Administered 2015-06-14 – 2015-06-30 (×32): 25 mg via RECTAL
  Filled 2015-06-14 (×33): qty 1

## 2015-06-14 MED ORDER — HYDROMORPHONE HCL 1 MG/ML IJ SOLN
1.0000 mg | Freq: Once | INTRAMUSCULAR | Status: AC
  Administered 2015-06-14: 1 mg via INTRAVENOUS
  Filled 2015-06-14: qty 1

## 2015-06-14 MED ORDER — ONDANSETRON HCL 4 MG/2ML IJ SOLN
4.0000 mg | Freq: Four times a day (QID) | INTRAMUSCULAR | Status: DC | PRN
Start: 2015-06-14 — End: 2015-06-14

## 2015-06-14 MED ORDER — HYDROMORPHONE HCL 1 MG/ML IJ SOLN
0.5000 mg | Freq: Once | INTRAMUSCULAR | Status: AC
  Administered 2015-06-14: 0.5 mg via INTRAVENOUS
  Filled 2015-06-14: qty 1

## 2015-06-14 MED ORDER — AZATHIOPRINE 50 MG PO TABS
175.0000 mg | ORAL_TABLET | Freq: Every day | ORAL | Status: DC
  Administered 2015-06-14 – 2015-06-30 (×17): 175 mg via ORAL
  Filled 2015-06-14 (×16): qty 4

## 2015-06-14 MED ORDER — ACETAMINOPHEN 325 MG PO TABS
650.0000 mg | ORAL_TABLET | Freq: Four times a day (QID) | ORAL | Status: DC | PRN
  Administered 2015-06-14: 650 mg via ORAL
  Filled 2015-06-14: qty 2

## 2015-06-14 MED ORDER — GABAPENTIN 400 MG PO CAPS
800.0000 mg | ORAL_CAPSULE | Freq: Three times a day (TID) | ORAL | Status: DC
  Administered 2015-06-14 – 2015-06-30 (×48): 800 mg via ORAL
  Filled 2015-06-14 (×48): qty 2

## 2015-06-14 MED ORDER — SODIUM CHLORIDE 0.9 % IV BOLUS (SEPSIS)
1000.0000 mL | Freq: Once | INTRAVENOUS | Status: AC
  Administered 2015-06-14: 1000 mL via INTRAVENOUS

## 2015-06-14 MED ORDER — ENOXAPARIN SODIUM 40 MG/0.4ML ~~LOC~~ SOLN
40.0000 mg | SUBCUTANEOUS | Status: DC
  Filled 2015-06-14 (×2): qty 0.4

## 2015-06-14 MED ORDER — ONDANSETRON HCL 4 MG PO TABS: 4.0000 mg | ORAL_TABLET | Freq: Four times a day (QID) | ORAL | Status: DC | PRN

## 2015-06-14 MED ORDER — PRAZOSIN HCL 1 MG PO CAPS
1.0000 mg | ORAL_CAPSULE | Freq: Every day | ORAL | Status: DC
  Administered 2015-06-14 – 2015-06-29 (×16): 1 mg via ORAL
  Filled 2015-06-14 (×17): qty 1

## 2015-06-14 MED ORDER — ONDANSETRON HCL 4 MG/2ML IJ SOLN
4.0000 mg | Freq: Four times a day (QID) | INTRAMUSCULAR | Status: AC
  Administered 2015-06-14 – 2015-06-15 (×5): 4 mg via INTRAVENOUS
  Filled 2015-06-14 (×4): qty 2

## 2015-06-14 MED ORDER — SODIUM CHLORIDE 0.9 % IV BOLUS (SEPSIS): 1000.0000 mL | Freq: Once | INTRAVENOUS | Status: DC

## 2015-06-14 MED ORDER — NICOTINE 21 MG/24HR TD PT24
21.0000 mg | MEDICATED_PATCH | Freq: Every day | TRANSDERMAL | Status: DC
  Administered 2015-06-14 – 2015-06-30 (×17): 21 mg via TRANSDERMAL
  Filled 2015-06-14 (×17): qty 1

## 2015-06-14 MED ORDER — SODIUM CHLORIDE 0.9 % IV SOLN
INTRAVENOUS | Status: DC
  Administered 2015-06-14 – 2015-06-16 (×5): via INTRAVENOUS
  Filled 2015-06-14 (×9): qty 1000

## 2015-06-14 MED ORDER — SERTRALINE HCL 50 MG PO TABS
50.0000 mg | ORAL_TABLET | Freq: Every day | ORAL | Status: DC
  Administered 2015-06-14 – 2015-06-30 (×17): 50 mg via ORAL
  Filled 2015-06-14 (×17): qty 1

## 2015-06-14 MED ORDER — HYDROMORPHONE HCL 1 MG/ML IJ SOLN
1.0000 mg | INTRAMUSCULAR | Status: DC | PRN
  Administered 2015-06-14 – 2015-06-17 (×23): 1 mg via INTRAVENOUS
  Filled 2015-06-14 (×24): qty 1

## 2015-06-14 MED ORDER — METHYLPREDNISOLONE SODIUM SUCC 40 MG IJ SOLR
40.0000 mg | INTRAMUSCULAR | Status: DC
  Administered 2015-06-14 – 2015-06-17 (×4): 40 mg via INTRAVENOUS
  Filled 2015-06-14 (×4): qty 1

## 2015-06-14 MED ORDER — ACETAMINOPHEN 650 MG RE SUPP: 650.0000 mg | Freq: Four times a day (QID) | RECTAL | Status: DC | PRN

## 2015-06-14 MED ORDER — LAMOTRIGINE 25 MG PO TABS
25.0000 mg | ORAL_TABLET | Freq: Two times a day (BID) | ORAL | Status: DC
  Administered 2015-06-14 – 2015-06-30 (×32): 25 mg via ORAL
  Filled 2015-06-14 (×33): qty 1

## 2015-06-14 MED ORDER — LORAZEPAM 0.5 MG PO TABS
0.5000 mg | ORAL_TABLET | Freq: Two times a day (BID) | ORAL | Status: DC | PRN
  Administered 2015-06-14 – 2015-06-21 (×9): 0.5 mg via ORAL
  Filled 2015-06-14 (×9): qty 1

## 2015-06-14 NOTE — ED Notes (Signed)
Up to b/r, steady gait 

## 2015-06-14 NOTE — ED Notes (Addendum)
C/o LLQ pain, also nvd (denies: fever).  Returns d/t unable to keep pain meds down, vomits with PO intake despite zofran, was unable to fill phenergan, "feel the same, not worse, not better", rates pain 8/10, pinpoints to LLQ, denies bleeding. Pt of [place]. Alert, NAD, calm, interactive.

## 2015-06-14 NOTE — H&P (Signed)
History and Physical    Dow Blahnik ZDG:[id number] DOB: [birth year] DOA: 06/14/2015  Referring MD/NP/PA: EDP PCP: No PCP Per Patient  Outpatient Specialists: None PCP: none Patient coming from:  Home    Chief Complaint: abdominal pain and increased frequency of stool  HPI: Andre [name] is a 30 y.o. male with a longstanding history of ileocolonic Crohn's disease, s/p multiple bowel resections. 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 Patient presented to [facility] in Sept 2016 with Crohn's flare symptoms (he was living in [place] at the time),  CT scan in ED just prior to admission revealed inflammatory changes near ileocolonic anastomosis. Patient seen by [facility] who treated him with Solumedrol and flagyl and continuation of home Azathiopurine. Flex sigmoidoscopy revealed stricture and ulceration of distal rectum. Biopsies of ileocolonic anastomosis were negative. 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 CMV negative. He improved and was discharged home but returned three days later with recurrent abdominal pain. GI reevaluated. Patient's abdominal pain felt to be multifactorial (neuropathic, narcotic bowel and Crohn's).   Patient returned to [facility] earlier this month with LLQ pain and increased frequency of stools. Stool for C-diff was negative. He was seen again by [facility] and continued on home Prednisone and Imuran.  Patient discharged home where he spent 3 days then was hospitalized at [facility] for severe anxiety and depression with suicide ideation.     Patient tells me that his current left lower quadrant pain is the same as before being admitted to behavioral health. 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 Patient was started on Zoloft and switched from Xanax to Ativan and believes that his abdominal pain is also worse after medication changes. LLQ pain is  constant and worse with defecation. Having approx 14 BMs a day unrelated to PO intake. Many stools are nocturnal. He is unable to tolerate PO secondary to nausea. No recent antibiotics. No sick contacts at home. Patient has no blood in his stool. He has no arthralgias. No skin rashes. His anxiety and depression are controlled  ED Course:  BP 142/82 mmHg  Pulse 78  Temp(Src) 98.2 F (36.8 C) (Oral)  Resp 20  Ht 5' 2"  (1.575 m)  Wt 68.04 kg (150 lb)  BMI 27.43 kg/m2  SpO2 100%   Review of Systems: As per HPI otherwise 10 point review of systems negative.   Past Medical History  Diagnosis Date  . Anxiety   . Bipolar depression ([place])   . Crohn disease ([place]) 1997  . Anemia     Around 2013 required PRBC transfusions. Has received parenteral iron in past. Has taken oral iron in past.  . Crohn's disease [facility])     Past Surgical History  Procedure Laterality Date  . Subtotal colectomy      Has undergone a total of 3 separate bowel resections including terminal ileal resection and the equivalent of subtotal colectomy. Last surgery was in 2010.  Marland Kitchen Flexible sigmoidoscopy N/A 10/29/2014    Procedure: FLEXIBLE SIGMOIDOSCOPY;  Surgeon: Manus Gunning, MD;  Location: [place];  Service: Gastroenterology;  Laterality: N/A;     reports that he has been smoking.  He has never used smokeless tobacco. He reports that he does not drink alcohol or use illicit drugs.  Allergies  Allergen Reactions  . Lithium Other (See Comments)    Toxicity   . Remicade [Infliximab] Anaphylaxis  . Compazine [Prochlorperazine] Other (  See Comments)    agitation  . Ketorolac Other (See Comments)    Other reaction(s): GI Distress  . Morphine Other (See Comments)    Other reaction(s): GI Distress  . Nsaids Other (See Comments)    Gi distress  . Ciprofloxacin Rash  . Haldol [Haloperidol Lactate] Anxiety  . Metronidazole Rash    Family History  Problem Relation Age of Onset  . Hypertension Father      [facility]: Mother had diverticulitis and hypertension   Prior to Admission medications   Medication Sig Start Date End Date Taking? Authorizing Provider  azaTHIOprine (IMURAN) 50 MG tablet Take 3.5 tablets (175 mg total) by mouth daily. Crohn's disease 06/09/15   Encarnacion Slates, NP  gabapentin (NEURONTIN) 400 MG capsule Take 2 capsules (800 mg total) by mouth 3 (three) times daily. For agitation 06/09/15   Encarnacion Slates, NP  lamoTRIgine (LAMICTAL) 25 MG tablet Take 1 tablet (25 mg total) by mouth 2 (two) times daily. For mood stabilization 06/09/15   Encarnacion Slates, NP  LORazepam (ATIVAN) 0.5 MG tablet Take 1 tablet (0.5 mg total) by mouth every 12 (twelve) hours as needed for anxiety. 06/09/15   Encarnacion Slates, NP  nicotine (NICODERM CQ - DOSED IN MG/24 HOURS) 21 mg/24hr patch Place 1 patch (21 mg total) onto the skin daily. For nicotine 06/09/15   Encarnacion Slates, NP  ondansetron (ZOFRAN ODT) 4 MG disintegrating tablet Take 1 tablet (4 mg total) by mouth every 8 (eight) hours as needed for nausea or vomiting. 06/13/15   Okey Regal, PA-C  oxyCODONE-acetaminophen (PERCOCET) 5-325 MG tablet Take 1 tablet by mouth every 4 (four) hours as needed for severe pain. 06/13/15   Okey Regal, PA-C  prazosin (MINIPRESS) 1 MG capsule Take 1 capsule (1 mg total) by mouth at bedtime. For nightmares 06/09/15   Encarnacion Slates, NP  predniSONE (DELTASONE) 20 MG tablet Take 1 tablet (20 mg total) by mouth daily with breakfast. For inflammation associated with Crohn's Disease 06/09/15   Encarnacion Slates, NP  promethazine (PHENERGAN) 12.5 MG suppository Place 1 suppository (12.5 mg total) rectally every 6 (six) hours as needed for nausea or vomiting. 06/13/15   Okey Regal, PA-C  sertraline (ZOLOFT) 50 MG tablet Take 1 tablet (50 mg total) by mouth daily. For depression 06/09/15   Encarnacion Slates, NP    Physical Exam:  Filed Vitals:   06/14/15 0438 06/14/15 0600 06/14/15 0630 06/14/15 0733  BP: 131/75 123/70 127/84 142/82  Pulse:  78 83 77 78  Temp: 98 F (36.7 C)   98.2 F (36.8 C)  TempSrc: Oral   Oral  Resp: 22   20  Height: 5' 2"  (1.575 m)     Weight: 68.04 kg (150 lb)     SpO2: 100% 95% 100% 100%   Constitutional: NAD, calm, comfortable Filed Vitals:   06/14/15 0438 06/14/15 0600 06/14/15 0630 06/14/15 0733  BP: 131/75 123/70 127/84 142/82  Pulse: 78 83 77 78  Temp: 98 F (36.7 C)   98.2 F (36.8 C)  TempSrc: Oral   Oral  Resp: 22   20  Height: 5' 2"  (1.575 m)     Weight: 68.04 kg (150 lb)     SpO2: 100% 95% 100% 100%   Eyes: PER, lids and conjunctivae normal ENMT: Mucous membranes are moist. Posterior pharynx clear of any exudate or lesions.Normal dentition.  Neck: normal, supple, no masses Respiratory: clear to auscultation bilaterally, no wheezing, no crackles. Normal  respiratory effort. No accessory muscle use.  Cardiovascular: Regular rate and rhythm, no murmurs / rubs / gallops. No extremity edema. 2+ pedal pulses. No carotid bruits.  Abdomen: no tenderness, no masses palpated. No hepatomegaly. Bowel sounds positive. Excoriation of lower midline abdominal incision Musculoskeletal: no clubbing / cyanosis. No joint deformity upper and lower extremities. Good ROM, no contractures. Normal muscle tone.  Skin: no rashes, lesions, ulcers. No induration Neurologic: CN 2-12 grossly intact. Sensation intact, DTR normal. Strength 5/5 in all 4.  Psychiatric: Normal judgment and insight. Alert and oriented x 3. Normal mood.   Labs on Admission: I have personally reviewed following labs and imaging studies  CBC:  Recent Labs Lab 06/13/15 1635  WBC 8.3  NEUTROABS 6.9  HGB 11.8*  HCT 35.2*  MCV 83.4  PLT 009    Basic Metabolic Panel:  Recent Labs Lab 06/13/15 1635 06/14/15 0600  NA 141 138  K 3.1* 4.3  CL 109 107  CO2 21* 16*  GLUCOSE 90 172*  BUN 7 9  CREATININE 0.76 0.98  CALCIUM 8.5* 9.0  MG  --  1.9    GFR: Estimated Creatinine Clearance: 95.2 mL/min (by C-G formula based on  Cr of 0.98).  Liver Function Tests:  Recent Labs Lab 06/13/15 1635  AST 16  ALT 20  ALKPHOS 82  BILITOT 1.7*  PROT 6.6  ALBUMIN 4.0    Recent Labs Lab 06/13/15 1635  LIPASE 17   Urine analysis:    Component Value Date/Time   COLORURINE YELLOW 05/26/2015 2130   APPEARANCEUR CLEAR 05/26/2015 2130   LABSPEC 1.025 05/26/2015 2130   PHURINE 6.5 05/26/2015 2130   GLUCOSEU NEGATIVE 05/26/2015 2130   HGBUR NEGATIVE 05/26/2015 2130   BILIRUBINUR NEGATIVE 05/26/2015 2130   KETONESUR NEGATIVE 05/26/2015 2130   PROTEINUR NEGATIVE 05/26/2015 2130   UROBILINOGEN 0.2 11/15/2014 0905   NITRITE NEGATIVE 05/26/2015 2130   LEUKOCYTESUR NEGATIVE 05/26/2015 2130    Radiological Exams on Admission: No results found.   Flexible Sigmoidoscopy Sept 2016 -COLON FINDINGS: There was a stenosis of the anal canal appreciated, although examination finger and colonoscope was able traverse it without difficulty. There was an area of ulceration in the very distal rectum / proximal anal canal at the stenosis, however the remainder of the examined colon was normal. There was roughly 25cm of colon / rectum present until the anastomosis to the small bowel was noted. The anastomosis appeared healthy, perhaps with mild narrowing but widely patent without inflammatory changes. Biopsies were taken of the anastomosis and ulcerated rectal area. Otherwise, the small bowel was intubated roughly 20cm and appeared normal without any inflammatory changes. Retroflexion was not performed given small size of the rectum and good views obtained an anterograde position.  Pathology Diagnosis 1. Colon, biopsy, Anastomosis site - BENIGN COLON WITH CHRONIC MUCOSAL INJURY, SEE COMMENT. - NEGATIVE FOR DYSPLASIA. - CMV IMMUNOSTAIN IS NEGATIVE. 2. Rectum, biopsy - MILDLY ACTIVE CHRONIC PROCTITIS, SEE COMMENT. - NEGATIVE FOR DYSPLASIA - CMV IMMUNOSTAIN IS NEGATIVE  Assessment/Plan  Crohn's ileocolitis, s/p multiple  bowel resections. Steroid dependent, on chronic Imuran. This is second admission this month for LLQ pain / increased frequency of stools. No active disease (except mild proctitis) on lower endoscopy Sept 2016.  -Place in Observation - Medical bed -IV hydration -scheduled anti-emetics -Will give dose of Solumedrol while awaiting GI evaluation.  -CRP, ESR -Doubt C-diff but patient was hospitalized at [facility] since last admission here when C-diff was negative and he does report  increased frequency of stools so will recheck for C-diff.    Hypokalemia, 3.1. -Will add K+ to IVF -am BMET      Depression / anxiety. Stable. Just released from [facility] for this.  -continue anti-depressants / anxiolytics  Tobacco dependence.  -Continue Nicoderm patch.  -Smoking cessation education by RN  DVT prophylaxis: Lovenox  Code Status:   Full code Family Communication:  Discussed with wife husband  Disposition Plan: Expect patient to be discharged to home in 24-48 hours Consults called:    [facility] - Silvano Rusk, MD to see Admission status: Observation / Medical bed   Tye Savoy , NP [facility]  If 7PM-7AM, please contact night-coverage [email or web address] Password [facility]  06/14/2015, 8:18 AM

## 2015-06-14 NOTE — ED Provider Notes (Signed)
CSN: [id number]     Arrival date & time 06/14/15  0426 History   First MD Initiated Contact with Patient 06/14/15 [phone]     Chief Complaint  Patient presents with  . Emesis     (Consider location/radiation/quality/duration/timing/severity/associated sxs/prior Treatment) HPI   Andre [name] is a 30 y.o. male with past medical history of Crohn's disease status post partial colectomy presenting today with abdominal pain. He describes his pain as left lower quadrant and is typical of his Crohn's flare. He's had nausea vomiting and diarrhea all day today. He was seen here in the emergency department and states he felt better upon discharge. However at home he could not keep down his Percocet or his Zofran. He presents for further care and hospital admission.  He denies urinary symptoms. There is no melena or hematochezia. There are no further complaints.  10 Systems reviewed and are negative for acute change except as noted in the HPI.      Past Medical History  Diagnosis Date  . Anxiety   . Bipolar depression ([facility])   . Crohn disease ([facility]) 1997  . Anemia     Around 2013 required PRBC transfusions. Has received parenteral iron in past. Has taken oral iron in past.  . Crohn's disease [facility])    Past Surgical History  Procedure Laterality Date  . Subtotal colectomy      Has undergone a total of 3 separate bowel resections including terminal ileal resection and the equivalent of subtotal colectomy. Last surgery was in 2010.  Marland Kitchen Flexible sigmoidoscopy N/A 10/29/2014    Procedure: FLEXIBLE SIGMOIDOSCOPY;  Surgeon: Ruffin Frederick, MD;  Location: [facility];  Service: Gastroenterology;  Laterality: N/A;   Family History  Problem Relation Age of Onset  . Hypertension Father    Social History  Substance Use Topics  . Smoking status: Current Every Day Smoker -- 0.50 packs/day for 5 years  . Smokeless tobacco: Never Used  . Alcohol Use: No    Review of Systems    Allergies   Lithium; Remicade; Compazine; Ketorolac; Morphine; Nsaids; Ciprofloxacin; Haldol; and Metronidazole  Home Medications   Prior to Admission medications   Medication Sig Start Date End Date Taking? Authorizing Provider  azaTHIOprine (IMURAN) 50 MG tablet Take 3.5 tablets (175 mg total) by mouth daily. Crohn's disease 06/09/15   Sanjuana Kava, NP  gabapentin (NEURONTIN) 400 MG capsule Take 2 capsules (800 mg total) by mouth 3 (three) times daily. For agitation 06/09/15   Sanjuana Kava, NP  lamoTRIgine (LAMICTAL) 25 MG tablet Take 1 tablet (25 mg total) by mouth 2 (two) times daily. For mood stabilization 06/09/15   Sanjuana Kava, NP  LORazepam (ATIVAN) 0.5 MG tablet Take 1 tablet (0.5 mg total) by mouth every 12 (twelve) hours as needed for anxiety. 06/09/15   Sanjuana Kava, NP  nicotine (NICODERM CQ - DOSED IN MG/24 HOURS) 21 mg/24hr patch Place 1 patch (21 mg total) onto the skin daily. For nicotine 06/09/15   Sanjuana Kava, NP  ondansetron (ZOFRAN ODT) 4 MG disintegrating tablet Take 1 tablet (4 mg total) by mouth every 8 (eight) hours as needed for nausea or vomiting. 06/13/15   Eyvonne Mechanic, PA-C  oxyCODONE-acetaminophen (PERCOCET) 5-325 MG tablet Take 1 tablet by mouth every 4 (four) hours as needed for severe pain. 06/13/15   Eyvonne Mechanic, PA-C  prazosin (MINIPRESS) 1 MG capsule Take 1 capsule (1 mg total) by mouth at bedtime. For nightmares 06/09/15   Nelda Marseille  Nwoko, NP  predniSONE (DELTASONE) 20 MG tablet Take 1 tablet (20 mg total) by mouth daily with breakfast. For inflammation associated with Crohn's Disease 06/09/15   Sanjuana Kava, NP  promethazine (PHENERGAN) 12.5 MG suppository Place 1 suppository (12.5 mg total) rectally every 6 (six) hours as needed for nausea or vomiting. 06/13/15   Eyvonne Mechanic, PA-C  sertraline (ZOLOFT) 50 MG tablet Take 1 tablet (50 mg total) by mouth daily. For depression 06/09/15   Sanjuana Kava, NP   BP 131/75 mmHg  Pulse 78  Temp(Src) 98 F (36.7 C) (Oral)   Resp 22  Ht 5\' 2"  (1.575 m)  Wt 150 lb (68.04 kg)  BMI 27.43 kg/m2  SpO2 100% Physical Exam  Constitutional: He is oriented to person, place, and time. Vital signs are normal. He appears well-developed and well-nourished.  Non-toxic appearance. He does not appear ill. No distress.  HENT:  Head: Normocephalic and atraumatic.  Nose: Nose normal.  Mouth/Throat: Oropharynx is clear and moist. No oropharyngeal exudate.  Eyes: Conjunctivae and EOM are normal. Pupils are equal, round, and reactive to light. No scleral icterus.  Neck: Normal range of motion. Neck supple. No tracheal deviation, no edema, no erythema and normal range of motion present. No thyroid mass and no thyromegaly present.  Cardiovascular: Normal rate, regular rhythm, S1 normal, S2 normal, normal heart sounds, intact distal pulses and normal pulses.  Exam reveals no gallop and no friction rub.   No murmur heard. Pulmonary/Chest: Effort normal and breath sounds normal. No respiratory distress. He has no wheezes. He has no rhonchi. He has no rales.  Abdominal: Soft. Normal appearance and bowel sounds are normal. He exhibits no distension, no ascites and no mass. There is no hepatosplenomegaly. There is no tenderness. There is no rebound, no guarding and no CVA tenderness.  Midline abdominal scar that is well-healed.  Musculoskeletal: Normal range of motion. He exhibits no edema or tenderness.  Lymphadenopathy:    He has no cervical adenopathy.  Neurological: He is alert and oriented to person, place, and time. He has normal strength. No cranial nerve deficit or sensory deficit.  Skin: Skin is warm, dry and intact. No petechiae and no rash noted. He is not diaphoretic. No erythema. No pallor.  Psychiatric: He has a normal mood and affect. His behavior is normal. Judgment normal.  Nursing note and vitals reviewed.   ED Course  Procedures (including critical care time) Labs Review Labs Reviewed  BASIC METABOLIC PANEL -  Abnormal; Notable for the following:    CO2 16 (*)    Glucose, Bld 172 (*)    All other components within normal limits  MAGNESIUM    Imaging Review No results found. I have personally reviewed and evaluated these images and lab results as part of my medical decision-making.   EKG Interpretation None      MDM   Final diagnoses:  None    Patient presents emergency department for Crohn's disease flare. He is a bounce back to the emergency department as he was just here and received IV fluids, pain medication and nausea medication. He continues to have vomiting and diarrhea at home and will be best served in the hospital for continued treatment. Physical exam is unremarkable and does not reveal any abdominal tenderness. He's having no blood in his stool. I do not believe imaging is warranted. Vital signs are normal. I will page hospitalist for transfer and admission.  He was ordered Dilaudid, Reglan, and IV fluids in  the emergency department.   Tomasita Crumble, MD 06/14/15 [phone]

## 2015-06-14 NOTE — ED Notes (Signed)
DR. Mora Bellman into room

## 2015-06-14 NOTE — ED Notes (Signed)
Pt up to bathroom, ambulated well, gait very steady.

## 2015-06-14 NOTE — ED Notes (Signed)
Phone Hand Off Report given to [facility] Transport Team 

## 2015-06-14 NOTE — Plan of Care (Signed)
[facility] PROGRESS NOTE  Patient: Andre [name] ZOX:[id number]   PCP: No PCP Per Patient DOB: 1986/01/21   DOA: 06/14/2015   DOS: 06/14/2015    Patient presents with recurrent abdominal pain. Was in the hospital in beginning of the April and discharged. The patient was also in the ER on 06/14/2015 and discharged home. Comes back to ER again with nausea vomiting and inability to tolerate anything by mouth. Accepted for med surge.  Author: Lynden Oxford, MD [facility] Pager: [phone] 06/14/2015 6:15 AM   If 7PM-7AM, please contact night-coverage at [email or web address], password [facility]

## 2015-06-14 NOTE — Consult Note (Signed)
Referring Provider: No ref. provider found Primary Care Physician:  No PCP Per Patient Primary Gastroenterologist:  Dr. Hilarie Fredrickson  Reason for Consultation:  Crohn's disease; abdominal pain IMPRESSION:  *Complicated pt with crohns ileocolitis dating back to age 30. Separate surgeries resulting in subtotal colectomy. Intolerant to several biologics, maintained on Imuran and Prednisone. GI care has been scattered across states of [place], [place], and now [place].   Colonoscopy 04/2014:  Small bowel disease w/ ileal ulcers but bxs/pathe NL  and anal fibrosis/narrowing and ? Anal fistula.  Flex sig 10/2014: anal stricture and ulcerations and active disease on biopsy, no CMV.  Currently experiencing recurrent flare of sxs. C diff negative earlier this month but is being ruled out again.  CRP slightly elevated, but stable from 4 months ago.  *Normocytic anemia. On po iron at home.   Unhealed mid abdominal surgical wound  PLAN: -Continue IV solumedrol 40 mg daily, which was restarted this AM. -Will give hydrocortisone suppositories BID for rectal/inflammation and narrowing. -Continue with IVF's, bowel rest, supportive care. -? Need for Entyvio but needs good follow-up to do this so needs to remain living in a single location.  ZEHR, JESSICA D.  06/14/2015, 10:56 AM  Pager number [phone]    [facility] Attending   I have taken al history, reviewed the chart and examined the patient w/ Ms. Myrtice Lauth. I agree with the Advanced Practitioner's note, impression and recommendations.   Probably a combo of IBS, IBD and psych overlay. May have had a superimposed infection? Recently had new psych meds - ? If causing nausea and vomiting. Probably not but could have some effects.  Will see if steroid suppositories help at all.  On higher dose of AZA though acceptable at > 2 mg/kg slightly - can have nausea from that frug - thiopurine metabolites can be checked as outpt if he follows up. Would also help w/  compliance confirmation. Was on for June appt Dr. Henrene Pastor but looks like he should follow w/ dr. Hilarie Fredrickson vased upon our system - seen by him first. He is a normal metabolizer per 3/16 labs in Care everywhere  Consider bile acid sequestrant.  Entyvio vs Stelara if he can demonstrate f/u  C diff is neg ESR is 5 CRP sl high at 1.4  Gatha Mayer, MD, [facility]astroenter[facility] [phone] (pager) [phone] after 5 PM, weekends and holidays  06/14/2015 2:56 PM    HPI: Andre [name] is a 30 y.o. male with a longstanding history of ileocolonic Crohn's disease, s/p multiple bowel resections. 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 Patient presented to [facility] in Sept 2016 with Crohn's flare symptoms (he was living in [place] at the time).  CT scan in ED just prior to admission revealed inflammatory changes near ileocolonic anastomosis. Patient seen by [facility] who treated him with Solumedrol and flagyl and continuation of home Azathiopurine. Flex sigmoidoscopy revealed stricture and ulceration of distal rectum. Biopsies of ileocolonic anastomosis were negative. 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 CMV negative. He improved and was discharged home but returned three days later with recurrent abdominal pain. GI reevaluated. Patient's abdominal pain felt to be multifactorial (neuropathic, narcotic bowel and Crohn's).   Patient returned to [facility] earlier this month with LLQ pain and increased frequency of stools. Stool for C-diff was negative. He was seen again by [facility] and continued on home Prednisone and Imuran. Patient discharged home where he spent 3 days then was hospitalized at [facility] for severe  anxiety and depression with suicide ideation.   Patient tells [place] that his symptoms are similar to previous admission. Patient has chronic loose stool secondary to subtotal colectomy but is currently having 14 BM's per day. Patient was started on Zoloft, lamictal,  ativan, and neurontin recently when in [facility].  Reports nausea and inability to take PO.  No GI bleeding.  Feels like his mood is good.    Please see consult note by Azucena Freed, PA-C on 05/27/2015 that has detailed outline of his past GI history.  Past Medical History  Diagnosis Date  . Anxiety   . Bipolar depression ([place])   . Crohn disease ([place]) 1997  . Anemia     Around 2013 required PRBC transfusions. Has received parenteral iron in past. Has taken oral iron in past.  . Crohn's disease [facility])     Past Surgical History  Procedure Laterality Date  . Subtotal colectomy      Has undergone a total of 3 separate bowel resections including terminal ileal resection and the equivalent of subtotal colectomy. Last surgery was in 2010.  Marland Kitchen Flexible sigmoidoscopy N/A 10/29/2014    Procedure: FLEXIBLE SIGMOIDOSCOPY;  Surgeon: Manus Gunning, MD;  Location: [place];  Service: Gastroenterology;  Laterality: N/A;    Prior to Admission medications   Medication Sig Start Date End Date Taking? Authorizing Provider  azaTHIOprine (IMURAN) 50 MG tablet Take 3.5 tablets (175 mg total) by mouth daily. Crohn's disease 06/09/15   Encarnacion Slates, NP  gabapentin (NEURONTIN) 400 MG capsule Take 2 capsules (800 mg total) by mouth 3 (three) times daily. For agitation 06/09/15   Encarnacion Slates, NP  lamoTRIgine (LAMICTAL) 25 MG tablet Take 1 tablet (25 mg total) by mouth 2 (two) times daily. For mood stabilization 06/09/15   Encarnacion Slates, NP  LORazepam (ATIVAN) 0.5 MG tablet Take 1 tablet (0.5 mg total) by mouth every 12 (twelve) hours as needed for anxiety. 06/09/15   Encarnacion Slates, NP  nicotine (NICODERM CQ - DOSED IN MG/24 HOURS) 21 mg/24hr patch Place 1 patch (21 mg total) onto the skin daily. For nicotine 06/09/15   Encarnacion Slates, NP  ondansetron (ZOFRAN ODT) 4 MG disintegrating tablet Take 1 tablet (4 mg total) by mouth every 8 (eight) hours as needed for nausea or vomiting. 06/13/15   Okey Regal, PA-C    oxyCODONE-acetaminophen (PERCOCET) 5-325 MG tablet Take 1 tablet by mouth every 4 (four) hours as needed for severe pain. 06/13/15   Okey Regal, PA-C  prazosin (MINIPRESS) 1 MG capsule Take 1 capsule (1 mg total) by mouth at bedtime. For nightmares 06/09/15   Encarnacion Slates, NP  predniSONE (DELTASONE) 20 MG tablet Take 1 tablet (20 mg total) by mouth daily with breakfast. For inflammation associated with Crohn's Disease 06/09/15   Encarnacion Slates, NP  promethazine (PHENERGAN) 12.5 MG suppository Place 1 suppository (12.5 mg total) rectally every 6 (six) hours as needed for nausea or vomiting. 06/13/15   Okey Regal, PA-C  sertraline (ZOLOFT) 50 MG tablet Take 1 tablet (50 mg total) by mouth daily. For depression 06/09/15   Encarnacion Slates, NP    Current Facility-Administered Medications  Medication Dose Route Frequency Provider Last Rate Last Dose  . acetaminophen (TYLENOL) tablet 650 mg  650 mg Oral Q6H PRN Willia Craze, NP   650 mg at 06/14/15 1019   Or  . acetaminophen (TYLENOL) suppository 650 mg  650 mg Rectal Q6H PRN Willia Craze, NP      .  azaTHIOprine (IMURAN) tablet 175 mg  175 mg Oral Daily Willia Craze, NP   175 mg at 06/14/15 1344  . enoxaparin (LOVENOX) injection 40 mg  40 mg Subcutaneous Q24H Willia Craze, NP   40 mg at 06/14/15 1020  . gabapentin (NEURONTIN) capsule 800 mg  800 mg Oral TID Willia Craze, NP   800 mg at 06/14/15 1344  . hydrocortisone (ANUSOL-HC) suppository 25 mg  25 mg Rectal BID Laban Emperor Zehr, PA-C   25 mg at 06/14/15 1344  . HYDROmorphone (DILAUDID) injection 1 mg  1 mg Intravenous Q3H PRN Willia Craze, NP   1 mg at 06/14/15 1231  . lamoTRIgine (LAMICTAL) tablet 25 mg  25 mg Oral BID Willia Craze, NP      . LORazepam (ATIVAN) tablet 0.5 mg  0.5 mg Oral Q12H PRN Willia Craze, NP      . methylPREDNISolone sodium succinate (SOLU-MEDROL) 40 mg/mL injection 40 mg  40 mg Intravenous Q24H Willia Craze, NP   40 mg at 06/14/15 1115  .  nicotine (NICODERM CQ - dosed in mg/24 hours) patch 21 mg  21 mg Transdermal Daily Willia Craze, NP   21 mg at 06/14/15 1345  . ondansetron (ZOFRAN) injection 4 mg  4 mg Intravenous Q6H Willia Craze, NP   4 mg at 06/14/15 1200  . prazosin (MINIPRESS) capsule 1 mg  1 mg Oral QHS Willia Craze, NP      . sertraline (ZOLOFT) tablet 50 mg  50 mg Oral Daily Willia Craze, NP   50 mg at 06/14/15 1344  . sodium chloride 0.9 % 1,000 mL with potassium chloride 20 mEq infusion   Intravenous Continuous Willia Craze, NP 100 mL/hr at 06/14/15 1118      Allergies as of 06/14/2015 - Review Complete 06/14/2015  Allergen Reaction Noted  . Lithium Other (See Comments) 10/25/2014  . Remicade [infliximab] Anaphylaxis 10/25/2014  . Compazine [prochlorperazine] Other (See Comments) 10/25/2014  . Ketorolac Other (See Comments) 11/05/2014  . Morphine Other (See Comments) 11/05/2014  . Nsaids Other (See Comments) 11/05/2014  . Ciprofloxacin Rash 11/05/2014  . Haldol [haloperidol lactate] Anxiety 05/25/2015  . Metronidazole Rash 11/05/2014    Family History  Problem Relation Age of Onset  . Hypertension Father     Social History   Social History  . Marital Status: Single    Spouse Name: N/A  . Number of Children: N/A  . Years of Education: N/A   Occupational History  . Not on file.   Social History Main Topics  . Smoking status: Current Every Day Smoker -- 0.50 packs/day for 5 years  . Smokeless tobacco: Never Used  . Alcohol Use: No  . Drug Use: No  . Sexual Activity: Not Currently   Other Topics Concern  . Not on file   Social History Narrative   Disabled   Single   Says he has moved to [place]   06/14/2015       Review of Systems: Has had recent behavioral health admit for depression [facility] [place] ROS is O/W negative except as mentioned in HPI.  Physical Exam: Vital signs in last 24 hours: Temp:  [98 F (36.7 C)-98.9 F (37.2 C)] 98.2 F (36.8 C) (04/23  1238) Pulse Rate:  [57-88] 59 (04/23 1238) Resp:  [18-22] 18 (04/23 1238) BP: (106-146)/(66-91) 135/81 mmHg (04/23 1238) SpO2:  [94 %-100 %] 98 % (04/23 1238) Weight:  [150 lb (68.04 kg)-160  lb (72.576 kg)] 150 lb (68.04 kg) (04/23 0904) Last BM Date: 06/14/15 General:  Alert, Well-developed, well-nourished, pleasant and cooperative in NAD Head:  Normocephalic and atraumatic. Eyes:  Sclera clear, no icterus.  Conjunctiva pink. Ears:  Normal auditory acuity. Mouth:  No deformity or lesions.   Lungs:  Clear throughout to auscultation.   No wheezes, crackles, or rhonchi.  Heart:  Regular rate and rhythm; no murmurs, clicks, rubs, or gallops. Abdomen:  Soft, non-distended.  BS present.  Mild diffuse TTP.  Laparotomy scar noted with some ? Keloid formation/hypertrophied scar that is slightly ulcerated. Rectal:  Slight narrowing with inflammation and tenderness. Msk:  Symmetrical without gross deformities. Pulses:  Normal pulses noted. Extremities:  Without clubbing or edema. Neurologic:  Alert and oriented x 4; grossly normal neurologically. Skin:  Intact without significant lesions or rashes. Psych:  Alert and cooperative. Normal mood and affect.  Lab Results:  Recent Labs  06/13/15 1635  WBC 8.3  HGB 11.8*  HCT 35.2*  PLT 276   BMET  Recent Labs  06/13/15 1635 06/14/15 0600  NA 141 138  K 3.1* 4.3  CL 109 107  CO2 21* 16*  GLUCOSE 90 172*  BUN 7 9  CREATININE 0.76 0.98  CALCIUM 8.5* 9.0   LFT  Recent Labs  06/13/15 1635  PROT 6.6  ALBUMIN 4.0  AST 16  ALT 20  ALKPHOS 82  BILITOT 1.7*   Lab Results  Component Value Date   LIPASE 17 06/13/2015

## 2015-06-15 DIAGNOSIS — K50018 Crohn's disease of small intestine with other complication: Secondary | ICD-10-CM | POA: Diagnosis not present

## 2015-06-15 DIAGNOSIS — R45851 Suicidal ideations: Secondary | ICD-10-CM | POA: Diagnosis not present

## 2015-06-15 DIAGNOSIS — K5080 Crohn's disease of both small and large intestine without complications: Secondary | ICD-10-CM | POA: Diagnosis present

## 2015-06-15 DIAGNOSIS — G8929 Other chronic pain: Secondary | ICD-10-CM | POA: Diagnosis present

## 2015-06-15 DIAGNOSIS — E876 Hypokalemia: Secondary | ICD-10-CM | POA: Diagnosis present

## 2015-06-15 DIAGNOSIS — D649 Anemia, unspecified: Secondary | ICD-10-CM | POA: Diagnosis present

## 2015-06-15 DIAGNOSIS — Z9049 Acquired absence of other specified parts of digestive tract: Secondary | ICD-10-CM | POA: Diagnosis not present

## 2015-06-15 DIAGNOSIS — Z881 Allergy status to other antibiotic agents status: Secondary | ICD-10-CM | POA: Diagnosis not present

## 2015-06-15 DIAGNOSIS — K591 Functional diarrhea: Secondary | ICD-10-CM | POA: Diagnosis not present

## 2015-06-15 DIAGNOSIS — Z885 Allergy status to narcotic agent status: Secondary | ICD-10-CM | POA: Diagnosis not present

## 2015-06-15 DIAGNOSIS — F17200 Nicotine dependence, unspecified, uncomplicated: Secondary | ICD-10-CM | POA: Diagnosis present

## 2015-06-15 DIAGNOSIS — K5090 Crohn's disease, unspecified, without complications: Secondary | ICD-10-CM | POA: Diagnosis present

## 2015-06-15 DIAGNOSIS — R1032 Left lower quadrant pain: Secondary | ICD-10-CM | POA: Diagnosis not present

## 2015-06-15 DIAGNOSIS — F419 Anxiety disorder, unspecified: Secondary | ICD-10-CM | POA: Diagnosis present

## 2015-06-15 DIAGNOSIS — Z7952 Long term (current) use of systemic steroids: Secondary | ICD-10-CM | POA: Diagnosis not present

## 2015-06-15 DIAGNOSIS — Z888 Allergy status to other drugs, medicaments and biological substances status: Secondary | ICD-10-CM | POA: Diagnosis not present

## 2015-06-15 DIAGNOSIS — F319 Bipolar disorder, unspecified: Secondary | ICD-10-CM | POA: Diagnosis present

## 2015-06-15 LAB — BASIC METABOLIC PANEL
ANION GAP: 12 (ref 5–15)
BUN: <5 mg/dL — ABNORMAL LOW (ref 6–20)
CO2: 22 mmol/L (ref 22–32)
Calcium: 8.6 mg/dL — ABNORMAL LOW (ref 8.9–10.3)
Chloride: 107 mmol/L (ref 101–111)
Creatinine, Ser: 0.8 mg/dL (ref 0.61–1.24)
GFR calc Af Amer: >60 mL/min (ref >60)
GLUCOSE: 95 mg/dL (ref 65–99)
POTASSIUM: 3.7 mmol/L (ref 3.5–5.1)
Sodium: 141 mmol/L (ref 135–145)

## 2015-06-15 LAB — CBC
HEMATOCRIT: 34 % — AB (ref 39.0–52.0)
HEMOGLOBIN: 10.8 g/dL — AB (ref 13.0–17.0)
MCH: 27.3 pg (ref 26.0–34.0)
MCHC: 31.8 g/dL (ref 30.0–36.0)
MCV: 85.9 fL (ref 78.0–100.0)
Platelets: 196 10*3/uL (ref 150–400)
RBC: 3.96 MIL/uL — ABNORMAL LOW (ref 4.22–5.81)
RDW: 15.7 % — ABNORMAL HIGH (ref 11.5–15.5)
WBC: 7.2 10*3/uL (ref 4.0–10.5)

## 2015-06-15 MED ORDER — OXYCODONE-ACETAMINOPHEN 5-325 MG PO TABS
1.0000 | ORAL_TABLET | ORAL | Status: DC | PRN
  Administered 2015-06-15 – 2015-06-16 (×6): 2 via ORAL
  Filled 2015-06-15 (×6): qty 2

## 2015-06-15 NOTE — Progress Notes (Signed)
  PROGRESS NOTE  Andre [name] ZOX:[id number] DOB: 09-May-1985 DOA: 06/14/2015 PCP: No PCP Per Patient Outpatient Specialists:  Brief Narrative: 30 year old man with ileocolonic Crohn's disease, s/p multiple resections.  Assessment/Plan: 1. Crohn's disease. On Imuran, steroid-dependent 2. Nausea, vomiting. 3. Anxiety, bipolar   Management as per gastroenterology, they have recommended continuing Imuran and prednisone  Supportive care, pain control, antiemetics  DVT prophylaxis: Lovenox Code Status: full Family Communication: none Disposition Plan: home  Brendia Sacks, MD  [facility] Direct contact:  --Via amion app OR  --[email or web address]; password [email or web address] and click  7PM-7AM contact night coverage as above 06/15/2015, 3:45 PM    Consultants:  GI  Procedures:    Antimicrobials:    HPI/Subjective: Still has LLQ pain. Can't keep anything down.  Objective: Filed Vitals:   06/14/15 1238 06/14/15 2122 06/15/15 0554 06/15/15 1500  BP: 135/81 135/89 114/58 116/62  Pulse: 59 62 62 60  Temp: 98.2 F (36.8 C) 98.4 F (36.9 C) 98 F (36.7 C) 98 F (36.7 C)  TempSrc: Oral Oral  Oral  Resp: Height:      Weight:      SpO2: 98% 99% 100% 100%    Intake/Output Summary (Last 24 hours) at 06/15/15 1545 Last data filed at 06/15/15 1406  Gross per 24 hour  Intake   2090 ml  Output   1200 ml  Net    890 ml     Filed Weights   06/14/15 0438 06/14/15 0904  Weight: 68.04 kg (150 lb) 68.04 kg (150 lb)    Exam:    Constitutional:  . Appears calm, mildly uncomfortable Respiratory:  . CTA bilaterally, no w/r/r.  . Respiratory effort normal. No retractions or accessory muscle use Cardiovascular:  . RRR, no m/r/g Abdomen:  . Abdomen vertical midline incision with open area, no exudate or erythema. Some LLQ pain on palpation. Psychiatric:  . Mental status o Mood, affect appropriate  I have personally reviewed following labs and imaging  studies:  Basic metabolic panel unremarkable  No leukocytosis  Scheduled Meds: . azaTHIOprine  175 mg Oral Daily  . enoxaparin (LOVENOX) injection  40 mg Subcutaneous Q24H  . gabapentin  800 mg Oral TID  . hydrocortisone  25 mg Rectal BID  . lamoTRIgine  25 mg Oral BID  . methylPREDNISolone (SOLU-MEDROL) injection  40 mg Intravenous Q24H  . nicotine  21 mg Transdermal Daily  . ondansetron (ZOFRAN) IV  4 mg Intravenous Q6H  . prazosin  1 mg Oral QHS  . sertraline  50 mg Oral Daily   Continuous Infusions: . sodium chloride 0.9 % 1,000 mL with potassium chloride 20 mEq infusion 100 mL/hr at 06/15/15 8119    Active Problems:   Tobacco dependence   Hypokalemia   LLQ abdominal pain   Crohn's disease (H[facility])   Depression   Anxiety     Time spent 15 minutes

## 2015-06-15 NOTE — Progress Notes (Signed)
Daily Rounding Note  06/15/2015, 2:28 PM    SUBJECTIVE:       6 or so non-bloody, watery/brown stools since 6AM today.  Diarrhea overnight as well.  Pain in LLQ.  Vomited jello and broth, able to keep down water and ginger ale though.   OBJECTIVE:         Vital signs in last 24 hours:    Temp:  [98 F (36.7 C)-98.4 F (36.9 C)] 98 F (36.7 C) (04/24 0554) Pulse Rate:  [62] 62 (04/24 0554) Resp:  [17-18] 17 (04/24 0554) BP: (114-135)/(58-89) 114/58 mmHg (04/24 0554) SpO2:  [99 %-100 %] 100 % (04/24 0554) Last BM Date: 06/15/15 Filed Weights   06/14/15 0438 06/14/15 0904  Weight: 68.04 kg (150 lb) 68.04 kg (150 lb)   General: cushingoid, chronically ill looking   Heart: RRR Chest: clear bil.   Abdomen: soft, ND, minor if any tenderness.  BS active and not tinkling or tympanitic  Extremities: no CCE Neuro/Psych:  Oriented x 3.  No   Intake/Output from previous day: 04/23 0701 - 04/24 0700 In: 2210 [P.O.:1560; I.V.:650] Out: 600 [Urine:600]  Intake/Output this shift: Total I/O In: 960 [P.O.:960] Out: 600 [Urine:600]  Lab Results:  Recent Labs  06/13/15 1635 06/15/15 0602  WBC 8.3 7.2  HGB 11.8* 10.8*  HCT 35.2* 34.0*  PLT 276 196   BMET  Recent Labs  06/13/15 1635 06/14/15 0600 06/15/15 0602  NA 141 138 141  K 3.1* 4.3 3.7  CL 109 107 107  CO2 21* 16* 22  GLUCOSE 90 172* 95  BUN 7 9 <5*  CREATININE 0.76 0.98 0.80  CALCIUM 8.5* 9.0 8.6*   LFT  Recent Labs  06/13/15 1635  PROT 6.6  ALBUMIN 4.0  AST 16  ALT 20  ALKPHOS 82  BILITOT 1.7*   PT/INR No results for input(s): LABPROT, INR in the last 72 hours. Hepatitis Panel No results for input(s): HEPBSAG, HCVAB, HEPAIGM, HEPBIGM in the last 72 hours.  Studies/Results: No results found.   Scheduled Meds: . azaTHIOprine  175 mg Oral Daily  . enoxaparin (LOVENOX) injection  40 mg Subcutaneous Q24H  . gabapentin  800 mg Oral TID    . hydrocortisone  25 mg Rectal BID  . lamoTRIgine  25 mg Oral BID  . methylPREDNISolone (SOLU-MEDROL) injection  40 mg Intravenous Q24H  . nicotine  21 mg Transdermal Daily  . ondansetron (ZOFRAN) IV  4 mg Intravenous Q6H  . prazosin  1 mg Oral QHS  . sertraline  50 mg Oral Daily   Continuous Infusions: . sodium chloride 0.9 % 1,000 mL with potassium chloride 20 mEq infusion 100 mL/hr at 06/15/15 0633   PRN Meds:.acetaminophen **OR** acetaminophen, HYDROmorphone (DILAUDID) injection, LORazepam, oxyCODONE-acetaminophen   ASSESMENT:   *  Crohn's ileocolitis.  Multiple surgeries resulting in subtotal colectomy.  Maintained on Imuran and Prednisone (had tapered to 20 mg at time of current admission).  Intolerant to mutiple biologics. Unhealed mid-abdominal surgical wound.  Care in [place], [place] and now [place].  04/2014 Colonoscopy: Small bowel disease w/ ileal ulcers but bxs/path NL, anal fibrosis/narrowing and ? Anal fistula. 10/2014 Flex sig: anal stricture and ulcerations and active disease on biopsy, no CMV.  Cortisone suppositories added 4/23.  ? Need for Entyvio but has yet to prove himself reliable for follow-up and needs to remain living in a single location. No show to Dr Adela Lank 02/05/15.  PMD is the [facility] health  and wellness clinic but has not been there since 10/2014.  Last CT was 11/05/2014.  Currently on another readmission with abdominal pain and increased diarrhea.   Pain mgt tweaked with restart of oral prednisone in addition to IV Dilaudid.   *  Depression, bipolar disorder, anxiety.  [facility] admission 4/9 - 4/18 for SI and depression, meds adjusted.   PLAN   *  Supportive care.  Leave on clears, immuran and solumedrol.  Rescheduled with Dr Adela Lank 6/21 at 3:30 PM.      Andre [name]  06/15/2015, 2:28 PM Pager: [phone]  GI ATTENDING  Interval history data reviewed. Agree with interval progress note as outlined above. Case discussed with Dr. Leone Payor. Prior evaluation  with Dr. Adela Lank reviewed as well. The patient may have diarrhea on the basis of subtotal colectomy or bacterial overgrowth. Not sure why he is having pain or vomiting. There has been no evidence for significant Crohn's recently and no evidence of significant abnormality or obstruction on imaging. Significant psychiatric overlay makes assessment a bit more challenging. In any event, okay to continue Imuran and prednisone (Solu-Medrol). Would be okay to treat with antidiarrheals. Could consider broad-spectrum antibiotic for possible bacterial overgrowth. As diet as tolerated. Will need to be compliant with GI follow-up as outpatient notes of best outcomes for his disease.  Andre [name]. Andre [name]., M.D. [facility] Division of Gastroenterology

## 2015-06-16 ENCOUNTER — Ambulatory Visit: Payer: Medicare Other | Admitting: Licensed Clinical Social Worker

## 2015-06-16 DIAGNOSIS — K50012 Crohn's disease of small intestine with intestinal obstruction: Secondary | ICD-10-CM

## 2015-06-16 DIAGNOSIS — R197 Diarrhea, unspecified: Secondary | ICD-10-CM | POA: Insufficient documentation

## 2015-06-16 DIAGNOSIS — R1032 Left lower quadrant pain: Secondary | ICD-10-CM | POA: Diagnosis not present

## 2015-06-16 DIAGNOSIS — K50018 Crohn's disease of small intestine with other complication: Secondary | ICD-10-CM | POA: Diagnosis not present

## 2015-06-16 MED ORDER — OXYCODONE-ACETAMINOPHEN 5-325 MG PO TABS
1.0000 | ORAL_TABLET | Freq: Four times a day (QID) | ORAL | Status: DC
  Administered 2015-06-16 – 2015-06-17 (×4): 1 via ORAL
  Filled 2015-06-16 (×4): qty 1

## 2015-06-16 MED ORDER — HEPARIN SODIUM (PORCINE) 5000 UNIT/ML IJ SOLN
5000.0000 [IU] | Freq: Three times a day (TID) | INTRAMUSCULAR | Status: DC
  Administered 2015-06-16 – 2015-06-20 (×13): 5000 [IU] via SUBCUTANEOUS
  Filled 2015-06-16 (×20): qty 1

## 2015-06-16 MED ORDER — POTASSIUM CHLORIDE IN NACL 20-0.9 MEQ/L-% IV SOLN
INTRAVENOUS | Status: DC
Start: 2015-06-16 — End: 2015-06-17
  Administered 2015-06-16 – 2015-06-17 (×2): via INTRAVENOUS
  Filled 2015-06-16 (×3): qty 1000

## 2015-06-16 MED ORDER — OXYCODONE HCL 5 MG PO TABS
10.0000 mg | ORAL_TABLET | Freq: Four times a day (QID) | ORAL | Status: DC
  Administered 2015-06-16 – 2015-06-17 (×4): 10 mg via ORAL
  Filled 2015-06-16 (×4): qty 2

## 2015-06-16 MED ORDER — OXYCODONE-ACETAMINOPHEN 5-325 MG PO TABS: 2.0000 | ORAL_TABLET | Freq: Four times a day (QID) | ORAL | Status: DC

## 2015-06-16 NOTE — Progress Notes (Signed)
Daily Rounding Note  06/16/2015, 11:44 AM  LOS: 1 day   SUBJECTIVE:   Left LQ pain ongoing.  About the same   Used 7 mg Dilaudid, 30 mg oxycodone yesterday 4 mg Dilaudid, 30 mg oxy so far today.  Oxy switched to 10 mg q 6 hours, scheduled.  Scheduled Zofran discontinued this AM.   Says he has had 6 or so small, watery, non-bloody BMs today  OBJECTIVE:         Vital signs in last 24 hours:    Temp:  [97.7 F (36.5 C)-98.2 F (36.8 C)] 97.7 F (36.5 C) (04/25 0503) Pulse Rate:  [60-95] 95 (04/25 0503) Resp:  [18] 18 (04/25 0503) BP: (116-138)/(62-91) 133/91 mmHg (04/25 0503) SpO2:  [99 %-100 %] 99 % (04/25 0503) Last BM Date: 06/16/15 Filed Weights   06/14/15 0438 06/14/15 0904  Weight: 68.04 kg (150 lb) 68.04 kg (150 lb)   General: looks the same.   Not uncomfortable or toxic.  Just looks cushingoid and depressed   Heart: RRR Chest: clear bil Abdomen: soft, moderate LLQ tenderness.  Active BS  Extremities: no CCE Neuro/Psych:  Oriented x 3.  Alert.  Depressed, calm.   Intake/Output from previous day: 04/24 0701 - 04/25 0700 In: 5156.7 [P.O.:2906; I.V.:2250.7] Out: 3700 [Urine:3700]  Intake/Output this shift: Total I/O In: 480 [P.O.:480] Out: 600 [Urine:600]  Lab Results:  Recent Labs  06/13/15 1635 06/15/15 0602  WBC 8.3 7.2  HGB 11.8* 10.8*  HCT 35.2* 34.0*  PLT 276 196   BMET  Recent Labs  06/13/15 1635 06/14/15 0600 06/15/15 0602  NA 141 138 141  K 3.1* 4.3 3.7  CL 109 107 107  CO2 21* 16* 22  GLUCOSE 90 172* 95  BUN 7 9 <5*  CREATININE 0.76 0.98 0.80  CALCIUM 8.5* 9.0 8.6*   LFT  Recent Labs  06/13/15 1635  PROT 6.6  ALBUMIN 4.0  AST 16  ALT 20  ALKPHOS 82  BILITOT 1.7*   PT/INR No results for input(s): LABPROT, INR in the last 72 hours. Hepatitis Panel No results for input(s): HEPBSAG, HCVAB, HEPAIGM, HEPBIGM in the last 72 hours.  Studies/Results: No results  found.   Scheduled Meds: . azaTHIOprine  175 mg Oral Daily  . gabapentin  800 mg Oral TID  . heparin subcutaneous  5,000 Units Subcutaneous Q8H  . hydrocortisone  25 mg Rectal BID  . lamoTRIgine  25 mg Oral BID  . methylPREDNISolone (SOLU-MEDROL) injection  40 mg Intravenous Q24H  . nicotine  21 mg Transdermal Daily  . oxyCODONE-acetaminophen  2 tablet Oral Q6H  . prazosin  1 mg Oral QHS  . sertraline  50 mg Oral Daily   Continuous Infusions: . sodium chloride 0.9 % 1,000 mL with potassium chloride 20 mEq infusion 100 mL/hr at 06/16/15 0233   PRN Meds:.acetaminophen **OR** acetaminophen, HYDROmorphone (DILAUDID) injection, LORazepam   ASSESMENT:   * Crohn's ileocolitis. Multiple surgeries resulting in subtotal colectomy. Maintained on Imuran and Prednisone (had tapered to 20 mg at time of current admission). Intolerant to mutiple biologics. Unhealed mid-abdominal surgical wound. Care in [place], [place] and now [place].  04/2014 Colonoscopy: Small bowel disease w/ ileal ulcers but bxs/path NL, anal fibrosis/narrowing and ? Anal fistula. 10/2014 Flex sig: anal stricture and ulcerations and active disease on biopsy, no CMV.  ? Need for Entyvio but has yet to prove himself reliable for follow-up and needs to remain living in a single  location. No show to Dr Adela Lank 02/05/15. PMD is the [facility] health and wellness clinic but has not been there since 10/2014.  Last CT was 11/05/2014.  Currently on another readmission with abdominal pain and increased diarrhea.  Pain out of proportion to disease, suspect IBS and psychiatric overlay. ? Bacterial overgrowth.   Day 3 Solumedrol and HC suppositories.   * Depression, bipolar disorder, anxiety. [facility] admission 4/9 - 4/18 for SI and depression, meds adjusted.   PLAN   *  Add tid imodium scheduled? And/or start abx (several options)  for ? SB bacterial overgrowth.   Will d/w Dr Marina Goodell.   Jennye Moccasin  06/16/2015, 11:44 AM Pager: [phone]  GI  ATTENDING  Interval history and data reviewed. Patient seen and examined. Agree with interval progress note as outlined above.Quite stable. Pain remains out of proportion to disease. I would be careful with over allowing narcotics in this patient. For his complaints of diarrhea I would start empiric Imodium 2 times daily.Scheduled first. Hold for constipation. Adjust as needed.Sent him home when tolerating diet. He can follow-up in our clinic with Dr. Adela Lank after discharge.Nothing further to add GI perspective. Will sign off but are available for questions. Thanks  [name]. Eda Keys., M.D. [facility] Division of Gastroenterology

## 2015-06-16 NOTE — Progress Notes (Signed)
  PROGRESS NOTE  Andre [name] [id number]:[id number] DOB: May 23, 1985 DOA: 06/14/2015 PCP: No PCP Per Patient Outpatient Specialists: [facility]  Brief Narrative: 30 year old man with ileocolonic Crohn's disease, s/p multiple resections.  Assessment/Plan: 1. Crohn's disease. On Imuran, steroid-dependent Continue management as per GI with steroids. 2. Nausea, vomiting.stable. 3. Anxiety, bipolar   Management as per gastroenterology  Continue supportive care, pain control, antiemetics  DVT prophylaxis: declines Lovenox, wants to utilize heparin instead Code Status: full Family Communication: none Disposition Plan: home likely next 48 hours  Brendia Sacks, MD  [facility] Direct contact:  --Via amion app OR  --[email or web address]; password [email or web address] and click  7PM-7AM contact night coverage as above 06/16/2015, 11:27 AM  LOS: 1 day   Consultants:  GI  Procedures:    Antimicrobials:    HPI/Subjective: Bowels continue to move. Still difficulty tolerating liquids. Left lower quadrant pain unchanged.  Objective: Filed Vitals:   06/15/15 0554 06/15/15 1500 06/15/15 2109 06/16/15 0503  BP: 114/58 116/62 138/91 133/91  Pulse: 62 60 82 95  Temp: 98 F (36.7 C) 98 F (36.7 C) 98.2 F (36.8 C) 97.7 F (36.5 C)  TempSrc:  Oral Oral Oral  Resp: 17 18 18 18   Height:      Weight:      SpO2: 100% 100% 99% 99%    Intake/Output Summary (Last 24 hours) at 06/16/15 1127 Last data filed at 06/16/15 1025  Gross per 24 hour  Intake 5156.67 ml  Output   4300 ml  Net 856.67 ml     Filed Weights   06/14/15 0438 06/14/15 0904  Weight: 68.04 kg (150 lb) 68.04 kg (150 lb)    Exam: Constitutional:  . Appears calm and comfortable Respiratory:  . CTA bilaterally, no w/r/r.  . Respiratory effort normal. No retractions or accessory muscle use Cardiovascular:  . RRR, no m/r/g Abdomen:  . Abdomen soft, moderate left lower quadrant pain Psychiatric:  . Mental status o Appears  depressed, flat affect  I have personally reviewed following labs and imaging studies:  No new labs  Scheduled Meds: . azaTHIOprine  175 mg Oral Daily  . enoxaparin (LOVENOX) injection  40 mg Subcutaneous Q24H  . gabapentin  800 mg Oral TID  . hydrocortisone  25 mg Rectal BID  . lamoTRIgine  25 mg Oral BID  . methylPREDNISolone (SOLU-MEDROL) injection  40 mg Intravenous Q24H  . nicotine  21 mg Transdermal Daily  . oxyCODONE-acetaminophen  2 tablet Oral Q6H  . prazosin  1 mg Oral QHS  . sertraline  50 mg Oral Daily   Continuous Infusions: . sodium chloride 0.9 % 1,000 mL with potassium chloride 20 mEq infusion 100 mL/hr at 06/16/15 8381    Active Problems:   Tobacco dependence   Hypokalemia   LLQ abdominal pain   Crohn's disease (HCC)   Depression   Anxiety   LOS: 1 day   Time spent 15 minutes

## 2015-06-17 DIAGNOSIS — R1032 Left lower quadrant pain: Secondary | ICD-10-CM | POA: Diagnosis not present

## 2015-06-17 DIAGNOSIS — K50012 Crohn's disease of small intestine with intestinal obstruction: Secondary | ICD-10-CM | POA: Diagnosis not present

## 2015-06-17 MED ORDER — ONDANSETRON 4 MG PO TBDP
4.0000 mg | ORAL_TABLET | Freq: Three times a day (TID) | ORAL | Status: DC | PRN
  Administered 2015-06-17 – 2015-06-18 (×2): 4 mg via ORAL
  Filled 2015-06-17 (×2): qty 1

## 2015-06-17 MED ORDER — HYDROMORPHONE HCL 1 MG/ML IJ SOLN
INTRAMUSCULAR | Status: AC
  Filled 2015-06-17: qty 1

## 2015-06-17 MED ORDER — PREDNISONE 20 MG PO TABS
40.0000 mg | ORAL_TABLET | Freq: Every day | ORAL | Status: DC
  Administered 2015-06-18 – 2015-06-30 (×13): 40 mg via ORAL
  Filled 2015-06-17 (×13): qty 2

## 2015-06-17 MED ORDER — OXYCODONE-ACETAMINOPHEN 5-325 MG PO TABS
1.0000 | ORAL_TABLET | ORAL | Status: DC | PRN
Start: 2015-06-17 — End: 2015-06-21
  Administered 2015-06-17 – 2015-06-21 (×23): 2 via ORAL
  Filled 2015-06-17 (×23): qty 2

## 2015-06-17 MED ORDER — HYDROMORPHONE HCL 1 MG/ML IJ SOLN
0.5000 mg | Freq: Once | INTRAMUSCULAR | Status: AC
  Administered 2015-06-17: 0.5 mg via INTRAVENOUS

## 2015-06-17 NOTE — Progress Notes (Signed)
  PROGRESS NOTE  April Tomac [id number]:[id number] DOB: March 22, 1985 DOA: 06/14/2015 PCP: No PCP Per Patient Outpatient Specialists: Dr Adela Lank   Brief Narrative: 30 year old man with ileocolonic Crohn's disease, s/p multiple resections.  Assessment/Plan: 1. Crohn's disease. On Imuran, steroid-dependent. Continue management as per GI with steroids and Imuran. May use imodium for diarrhea. GI has signed off. 2. Unhealed surgical wound abdomen. Stable. 3. Nausea, vomiting. Resolved. 4. Anxiety, bipolar   Overall better, tolerating liquids, will advance to soft diet.  Change to oral steroids and if tolerates diet, home next 24 hours  DVT prophylaxis: declines Lovenox, wants to utilize heparin instead Code Status: full Family Communication: none Disposition Plan:    Brendia Sacks, MD  [facility] Direct contact:  --Via amion app OR  --[email or web address]; password [email or web address] and click  7PM-7AM contact night coverage as above 06/17/2015, 9:56 AM  LOS: 2 days   Consultants:  GI  Procedures:    Antimicrobials:    HPI/Subjective: Better today, tolerating liquids. Abd pain about the same. No vomiting.  Objective: Filed Vitals:   06/16/15 0503 06/16/15 1351 06/16/15 2115 06/17/15 0512  BP: 133/91 133/79 142/91 136/76  Pulse: 95 81 73 88  Temp: 97.7 F (36.5 C) 98.1 F (36.7 C) 98.6 F (37 C) 98.1 F (36.7 C)  TempSrc: Oral Oral Oral Oral  Resp: 18 18 18 18   Height:      Weight:      SpO2: 99% 100% 100% 100%    Intake/Output Summary (Last 24 hours) at 06/17/15 0956 Last data filed at 06/17/15 0515  Gross per 24 hour  Intake   2672 ml  Output   2400 ml  Net    272 ml     Filed Weights   06/14/15 0438 06/14/15 0904  Weight: 68.04 kg (150 lb) 68.04 kg (150 lb)    Exam: Constitutional:  . Appears calm and comfortable Respiratory:  . CTA bilaterally, no w/r/r.  . Respiratory effort normal. No retractions or accessory muscle use Cardiovascular:  . RRR, no  m/r/g Abdomen:  . Abdomen soft, nondistended, mild LLQ Psychiatric:  . Mental status o Appears depressed, flat affect  I have personally reviewed following labs and imaging studies:  No new labs  Scheduled Meds: . azaTHIOprine  175 mg Oral Daily  . gabapentin  800 mg Oral TID  . heparin subcutaneous  5,000 Units Subcutaneous Q8H  . hydrocortisone  25 mg Rectal BID  . lamoTRIgine  25 mg Oral BID  . methylPREDNISolone (SOLU-MEDROL) injection  40 mg Intravenous Q24H  . nicotine  21 mg Transdermal Daily  . oxyCODONE-acetaminophen  1 tablet Oral Q6H   And  . oxyCODONE  10 mg Oral Q6H  . prazosin  1 mg Oral QHS  . sertraline  50 mg Oral Daily   Continuous Infusions: . 0.9 % NaCl with KCl 20 mEq / L 100 mL/hr at 06/16/15 1418    Active Problems:   Tobacco dependence   Hypokalemia   LLQ abdominal pain   Crohn's disease (HCC)   Depression   Anxiety   Diarrhea   LOS: 2 days   Time spent 15 minutes

## 2015-06-18 DIAGNOSIS — K50019 Crohn's disease of small intestine with unspecified complications: Secondary | ICD-10-CM | POA: Diagnosis not present

## 2015-06-18 DIAGNOSIS — K5090 Crohn's disease, unspecified, without complications: Secondary | ICD-10-CM | POA: Diagnosis not present

## 2015-06-18 DIAGNOSIS — R112 Nausea with vomiting, unspecified: Secondary | ICD-10-CM

## 2015-06-18 DIAGNOSIS — K5080 Crohn's disease of both small and large intestine without complications: Secondary | ICD-10-CM | POA: Diagnosis not present

## 2015-06-18 DIAGNOSIS — R1032 Left lower quadrant pain: Secondary | ICD-10-CM | POA: Diagnosis not present

## 2015-06-18 LAB — BASIC METABOLIC PANEL
ANION GAP: 8 (ref 5–15)
BUN: 7 mg/dL (ref 6–20)
CALCIUM: 8.6 mg/dL — AB (ref 8.9–10.3)
CO2: 24 mmol/L (ref 22–32)
Chloride: 113 mmol/L — ABNORMAL HIGH (ref 101–111)
Creatinine, Ser: 0.79 mg/dL (ref 0.61–1.24)
Glucose, Bld: 90 mg/dL (ref 65–99)
Potassium: 3.4 mmol/L — ABNORMAL LOW (ref 3.5–5.1)
Sodium: 145 mmol/L (ref 135–145)

## 2015-06-18 LAB — CBC
HCT: 30.9 % — ABNORMAL LOW (ref 39.0–52.0)
Hemoglobin: 9.9 g/dL — ABNORMAL LOW (ref 13.0–17.0)
MCH: 27.3 pg (ref 26.0–34.0)
MCHC: 32 g/dL (ref 30.0–36.0)
MCV: 85.1 fL (ref 78.0–100.0)
Platelets: 149 10*3/uL — ABNORMAL LOW (ref 150–400)
RBC: 3.63 MIL/uL — AB (ref 4.22–5.81)
RDW: 15 % (ref 11.5–15.5)
WBC: 5.1 10*3/uL (ref 4.0–10.5)

## 2015-06-18 MED ORDER — LOPERAMIDE HCL 2 MG PO CAPS
2.0000 mg | ORAL_CAPSULE | Freq: Two times a day (BID) | ORAL | Status: DC
  Administered 2015-06-18 – 2015-06-19 (×3): 2 mg via ORAL
  Filled 2015-06-18 (×3): qty 1

## 2015-06-18 MED ORDER — HYDROMORPHONE HCL 1 MG/ML IJ SOLN
0.5000 mg | Freq: Once | INTRAMUSCULAR | Status: AC
  Administered 2015-06-18: 0.5 mg via INTRAVENOUS
  Filled 2015-06-18: qty 1

## 2015-06-18 MED ORDER — POTASSIUM CHLORIDE CRYS ER 20 MEQ PO TBCR
40.0000 meq | EXTENDED_RELEASE_TABLET | Freq: Two times a day (BID) | ORAL | Status: AC
  Administered 2015-06-18 (×2): 40 meq via ORAL
  Filled 2015-06-18 (×2): qty 2

## 2015-06-18 NOTE — Care Management Important Message (Signed)
Important Message  Patient Details  Name: Andre [name] MRN: [id number] Date of Birth: [birth year]   Medicare Important Message Given:  Yes    Kairi Tufo P Nalia Honeycutt 06/18/2015, 3:11 PM

## 2015-06-18 NOTE — Progress Notes (Signed)
  PROGRESS NOTE  Andre [name] [id number]:[id number] DOB: 12-28-1985 DOA: 06/14/2015 PCP: No PCP Per Patient Patient Care Team: No Pcp Per Patient as PCP - General (General Practice) Ruffin Frederick, MD as Consulting Physician (Gastroenterology)  Brief Narrative: 30 year old man with ileocolonic Crohn's disease, s/p multiple resections.  Assessment/Plan: 1. Crohn's disease. Appears stable. On Imuran, steroid-dependent. Continue management as per GI with steroids and Imuran. May use imodium for diarrhea. GI has signed off.  2. Nausea and vomiting, recurrent. Secondary to chronic pain and Crohn's disease.  3. Unhealed surgical wound abdomen. Stable. 4. Anxiety, bipolar   Downgrade diet to liquids. If tolerates anticipate discharge next 24 hours.  No IV pain medication  Schedule loperamide  DVT prophylaxis: declines Lovenox, wants to utilize heparin instead Code Status: full Family Communication: none Disposition Plan:    Brendia Sacks, MD  [facility] Direct contact:  --Via amion app OR  --[email or web address]; password [email or web address] and click  7PM-7AM contact night coverage as above 06/18/2015, 11:56 AM  LOS: 3 days   Consultants:  GI  Procedures:    Antimicrobials:    HPI/Subjective: He was able to tolerate peanut butter and crackers for dinner last night but did vomit breakfast this morning. He wants to go back to liquids.  Objective: Filed Vitals:   06/17/15 0512 06/17/15 1316 06/17/15 2008 06/18/15 0620  BP: 136/76 137/84 119/68 114/64  Pulse: 88 78 96 87  Temp: 98.1 F (36.7 C) 98.2 F (36.8 C) 98.8 F (37.1 C) 98 F (36.7 C)  TempSrc: Oral Oral Oral Oral  Resp: 18 18 19 19   Height:      Weight:      SpO2: 100% 98% 98% 92%    Intake/Output Summary (Last 24 hours) at 06/18/15 1156 Last data filed at 06/17/15 1843  Gross per 24 hour  Intake 1791.67 ml  Output   1051 ml  Net 740.67 ml     Filed Weights   06/14/15 0438 06/14/15 0904  Weight: 68.04  kg (150 lb) 68.04 kg (150 lb)    Exam: Constitutional:  . Appears calm and comfortable, room is dark Respiratory:  . CTA bilaterally, no w/r/r.  . Respiratory effort normal. No retractions or accessory muscle use Cardiovascular:  . RRR, no m/r/g Abdomen:  Marland Kitchen Mild tenderness, no distension Psychiatric:  . Mental status Appears depressed and has flat affect  I have personally reviewed following labs and imaging studies:  Potassium 3.4, BMP otherwise unremarkable  Hemoglobin 9.9, no leukocytosis.  Scheduled Meds: . azaTHIOprine  175 mg Oral Daily  . gabapentin  800 mg Oral TID  . heparin subcutaneous  5,000 Units Subcutaneous Q8H  . hydrocortisone  25 mg Rectal BID  . lamoTRIgine  25 mg Oral BID  . loperamide  2 mg Oral BID  . nicotine  21 mg Transdermal Daily  . potassium chloride  40 mEq Oral BID  . prazosin  1 mg Oral QHS  . predniSONE  40 mg Oral Q breakfast  . sertraline  50 mg Oral Daily   Continuous Infusions:    Active Problems:   Tobacco dependence   Hypokalemia   LLQ abdominal pain   Crohn's disease (HCC)   Depression   Anxiety   Diarrhea   LOS: 3 days   Time spent 15 minutes

## 2015-06-19 DIAGNOSIS — K50019 Crohn's disease of small intestine with unspecified complications: Secondary | ICD-10-CM | POA: Diagnosis not present

## 2015-06-19 DIAGNOSIS — R1032 Left lower quadrant pain: Secondary | ICD-10-CM | POA: Diagnosis not present

## 2015-06-19 MED ORDER — LOPERAMIDE HCL 2 MG PO CAPS
2.0000 mg | ORAL_CAPSULE | Freq: Four times a day (QID) | ORAL | Status: DC
  Administered 2015-06-19 – 2015-06-25 (×26): 2 mg via ORAL
  Filled 2015-06-19 (×26): qty 1

## 2015-06-19 MED ORDER — OXYCODONE HCL 5 MG PO TABS
10.0000 mg | ORAL_TABLET | Freq: Once | ORAL | Status: AC
  Administered 2015-06-19: 10 mg via ORAL
  Filled 2015-06-19: qty 2

## 2015-06-19 MED ORDER — SODIUM CHLORIDE 0.9 % IV SOLN
INTRAVENOUS | Status: DC
  Administered 2015-06-19 – 2015-06-21 (×4): via INTRAVENOUS

## 2015-06-19 MED ORDER — ONDANSETRON 4 MG PO TBDP
4.0000 mg | ORAL_TABLET | Freq: Four times a day (QID) | ORAL | Status: DC
  Administered 2015-06-19 – 2015-06-30 (×45): 4 mg via ORAL
  Filled 2015-06-19 (×44): qty 1

## 2015-06-19 NOTE — Progress Notes (Signed)
  PROGRESS NOTE  Andre [name] [id number] DOB: 17-Nov-1985 DOA: 06/14/2015 PCP: No PCP Per Patient Patient Care Team: No Pcp Per Patient as PCP - General (General Practice) Ruffin Frederick, MD as Consulting Physician (Gastroenterology)  Brief Narrative: 30 year old man with ileocolonic Crohn's disease, s/p multiple resections.  Assessment/Plan: 1. Crohn's disease with acute and chronic pain. Gastroenterology has signed off. They have recommended continuing Imuran and steroids. Imodium scheduled for diarrhea as per GI. 2. Nausea and vomiting, intermittent. Secondary to chronic pain and Crohn's disease.  3. Unhealed surgical wound abdomen.  Remains stable. 4. Anxiety, bipolar   Continue liquids. Schedule Zofran.    he has not been ambulating, ambulate at least 3 times a day.  Oral pain medication as needed. No IV narcotics.  Anticipate discharge next 24 hours.  DVT prophylaxis: declines Lovenox, wants to utilize heparin instead Code Status: full Family Communication: none Disposition Plan:    Brendia Sacks, MD  [facility] Direct contact:  --Via amion app OR  --[email or web address]; password [email or web address] and click  7PM-7AM contact night coverage as above 06/19/2015, 9:36 AM  LOS: 4 days   Consultants:  GI  Procedures:    Antimicrobials:    HPI/Subjective: Difficulty yesterday afternoon with vomiting of dinner. For appetite, has not tried liquids this morning. Left-sided abdominal pain persists. Nausea.  Objective: Filed Vitals:   06/18/15 0620 06/18/15 1321 06/18/15 2003 06/19/15 0432  BP: 114/64 138/84 134/83 115/64  Pulse: 87 87 64 47  Temp: 98 F (36.7 C) 98.3 F (36.8 C) 98.3 F (36.8 C) 99.1 F (37.3 C)  TempSrc: Oral Oral Oral Oral  Resp: 19 18 19 19   Height:      Weight:      SpO2: 92% 98% 98% 98%    Intake/Output Summary (Last 24 hours) at 06/19/15 0936 Last data filed at 06/19/15 0903  Gross per 24 hour  Intake    720 ml  Output   1475  ml  Net   -755 ml     Filed Weights   06/14/15 0438 06/14/15 0904  Weight: 68.04 kg (150 lb) 68.04 kg (150 lb)    Exam: Constitutional:  . Appears calm and comfortable Respiratory:  . CTA bilaterally, no w/r/r.  . Respiratory effort normal. No retractions or accessory muscle use Cardiovascular:  . RRR, no m/r/g Abdomen:  . Abdomen some left-sided abdominal pain. Open wound mid abdomen uncomplicated in appearance. Psychiatric:  . Mental status o Mood, affect: Depressed, flat Lights are off in the room with blinds drawn day and night  I have personally reviewed following labs and imaging studies:  None today  Scheduled Meds: . azaTHIOprine  175 mg Oral Daily  . gabapentin  800 mg Oral TID  . heparin subcutaneous  5,000 Units Subcutaneous Q8H  . hydrocortisone  25 mg Rectal BID  . lamoTRIgine  25 mg Oral BID  . loperamide  2 mg Oral BID  . nicotine  21 mg Transdermal Daily  . ondansetron  4 mg Oral Q6H  . prazosin  1 mg Oral QHS  . predniSONE  40 mg Oral Q breakfast  . sertraline  50 mg Oral Daily   Continuous Infusions:    Active Problems:   Tobacco dependence   Hypokalemia   LLQ abdominal pain   Crohn's disease (HCC)   Depression   Anxiety   Diarrhea   LOS: 4 days   Time spent 15 minutes

## 2015-06-20 ENCOUNTER — Inpatient Hospital Stay (HOSPITAL_COMMUNITY): Payer: Medicare Other

## 2015-06-20 DIAGNOSIS — F419 Anxiety disorder, unspecified: Secondary | ICD-10-CM | POA: Diagnosis not present

## 2015-06-20 DIAGNOSIS — K50019 Crohn's disease of small intestine with unspecified complications: Secondary | ICD-10-CM | POA: Diagnosis not present

## 2015-06-20 DIAGNOSIS — K5080 Crohn's disease of both small and large intestine without complications: Secondary | ICD-10-CM | POA: Diagnosis not present

## 2015-06-20 DIAGNOSIS — K5090 Crohn's disease, unspecified, without complications: Secondary | ICD-10-CM | POA: Diagnosis not present

## 2015-06-20 DIAGNOSIS — F329 Major depressive disorder, single episode, unspecified: Secondary | ICD-10-CM | POA: Diagnosis not present

## 2015-06-20 LAB — COMPREHENSIVE METABOLIC PANEL
ALBUMIN: 3.3 g/dL — AB (ref 3.5–5.0)
ALK PHOS: 66 U/L (ref 38–126)
ALT: 12 U/L — ABNORMAL LOW (ref 17–63)
ANION GAP: 8 (ref 5–15)
AST: 19 U/L (ref 15–41)
BUN: <5 mg/dL — ABNORMAL LOW (ref 6–20)
CHLORIDE: 108 mmol/L (ref 101–111)
CO2: 27 mmol/L (ref 22–32)
Calcium: 8.6 mg/dL — ABNORMAL LOW (ref 8.9–10.3)
Creatinine, Ser: 0.86 mg/dL (ref 0.61–1.24)
GFR calc Af Amer: >60 mL/min (ref >60)
GFR calc non Af Amer: >60 mL/min (ref >60)
GLUCOSE: 100 mg/dL — AB (ref 65–99)
POTASSIUM: 3.9 mmol/L (ref 3.5–5.1)
SODIUM: 143 mmol/L (ref 135–145)
Total Bilirubin: 1 mg/dL (ref 0.3–1.2)
Total Protein: 5.9 g/dL — ABNORMAL LOW (ref 6.5–8.1)

## 2015-06-20 LAB — CBC WITH DIFFERENTIAL/PLATELET
BASOS PCT: 0 %
Basophils Absolute: 0 10*3/uL (ref 0.0–0.1)
EOS ABS: 0 10*3/uL (ref 0.0–0.7)
EOS PCT: 0 %
HCT: 37.3 % — ABNORMAL LOW (ref 39.0–52.0)
HEMOGLOBIN: 11.7 g/dL — AB (ref 13.0–17.0)
Lymphocytes Relative: 7 %
Lymphs Abs: 0.4 10*3/uL — ABNORMAL LOW (ref 0.7–4.0)
MCH: 27.1 pg (ref 26.0–34.0)
MCHC: 31.4 g/dL (ref 30.0–36.0)
MCV: 86.5 fL (ref 78.0–100.0)
MONOS PCT: 4 %
Monocytes Absolute: 0.2 10*3/uL (ref 0.1–1.0)
Neutro Abs: 5 10*3/uL (ref 1.7–7.7)
Neutrophils Relative %: 89 %
PLATELETS: 214 10*3/uL (ref 150–400)
RBC: 4.31 MIL/uL (ref 4.22–5.81)
RDW: 15.3 % (ref 11.5–15.5)
WBC: 5.7 10*3/uL (ref 4.0–10.5)

## 2015-06-20 LAB — LACTIC ACID, PLASMA
LACTIC ACID, VENOUS: 1.9 mmol/L (ref 0.5–2.0)
Lactic Acid, Venous: 1.7 mmol/L (ref 0.5–2.0)

## 2015-06-20 MED ORDER — DICYCLOMINE HCL 10 MG PO CAPS
10.0000 mg | ORAL_CAPSULE | Freq: Three times a day (TID) | ORAL | Status: DC
  Administered 2015-06-20 – 2015-06-23 (×10): 10 mg via ORAL
  Filled 2015-06-20 (×10): qty 1

## 2015-06-20 MED ORDER — KETOROLAC TROMETHAMINE 30 MG/ML IJ SOLN
30.0000 mg | Freq: Three times a day (TID) | INTRAMUSCULAR | Status: DC
  Administered 2015-06-20 – 2015-06-21 (×3): 30 mg via INTRAVENOUS
  Filled 2015-06-20 (×3): qty 1

## 2015-06-20 MED ORDER — HYOSCYAMINE SULFATE 0.125 MG SL SUBL
0.2500 mg | SUBLINGUAL_TABLET | Freq: Once | SUBLINGUAL | Status: AC
  Administered 2015-06-21: 0.25 mg via SUBLINGUAL
  Filled 2015-06-20: qty 2

## 2015-06-20 MED ORDER — MORPHINE SULFATE (PF) 4 MG/ML IV SOLN: 4.0000 mg | Freq: Once | INTRAVENOUS | Status: DC

## 2015-06-20 NOTE — Progress Notes (Signed)
[facility] Progress Note  Patient: Andre [name] [id number]   PCP: No PCP Per Patient DOB: 1985/04/18   DOA: 06/14/2015   DOS: 06/20/2015   Date of Service: the patient was seen and examined on 06/20/2015 Outpatient Specialists: none  Subjective: The patient mentions he had 12 episodes of diarrhea and 4 episodes of vomiting yesterday. Patient had 2 episodes of vomiting this morning. Patient does of significant 10 out of 10 abdominal pain on my evaluation.  While no evidence of vomiting and diarrhea reported by patient or RN this afternoon. Nutrition: Was on full liquid diet  Brief hospital course: Patient was admitted on 06/14/2015, with complaint of abdominal pain nausea and vomiting, was found to have mild Crohn's flareup. GI was consulted. Patient initiated on steroid. Scheduled Imodium was also added by GI. GI recommended to avoid escalating narcotics. Currently further plan is conservative treatment with ensuring adequate oral intake.  Assessment and Plan: 1. Crohn's disease (HCC) Patient complains of multiple diarrheas and vomiting although it is not documented. Patient also clinically does not appear dehydrated. Lab work also does not suggest any evidence of GI loss or dehydration. With this patient complains of significant pain and requires pain medication frequently. GI has been consulted and recommended to avoid escalating further narcotics. Continue current prednisone continue imuran. We will advance the diet to soft diet. Add Bentyl since the patient does not have any diarrhea. X-ray of the abdomen this morning does not show any evidence of significant bowel obstruction. Lactic acid 2 is also negative.  2. Anxiety. Continuing Ativan. Continuing Lamictal. Continue Zoloft.  3. Chronic pain management. Continuing Neurontin.  Activity: Currently independent Bowel regimen: last BM 06/20/2015 Diet: Soft diet DVT Prophylaxis: subcutaneous Heparin  Advance goals  of care discussion: Full code  Family Communication: no family was present at bedside, at the time of interview.   Disposition:  Expected discharge date: 06/21/2015 Barriers to safe discharge: Tolerance of oral diet  Consultants: Gastroenterology Procedures: None  Antibiotics: Anti-infectives    None        Intake/Output Summary (Last 24 hours) at 06/20/15 1829 Last data filed at 06/20/15 1805  Gross per 24 hour  Intake 2473.75 ml  Output   1000 ml  Net 1473.75 ml   Filed Weights   06/14/15 0438 06/14/15 0904  Weight: 68.04 kg (150 lb) 68.04 kg (150 lb)    Objective: Physical Exam: Filed Vitals:   06/19/15 1353 06/19/15 1945 06/20/15 0457 06/20/15 1441  BP: 125/63 143/81 128/68 129/85  Pulse: 87 78 56 62  Temp: 98.4 F (36.9 C) 98.6 F (37 C) 98.2 F (36.8 C) 98.4 F (36.9 C)  TempSrc: Oral Oral Oral Oral  Resp: 19 18 18 18   Height:      Weight:      SpO2: 100% 100% 100% 100%     General: Appear in mild distress, no Rash; Oral Mucosa moist. Cardiovascular: S1 and S2 Present, no Murmur, no JVD Respiratory: Bilateral Air entry present and Clear to Auscultation, no Crackles, no wheezes Abdomen: Bowel Sound [present, Soft and mild tenderness Extremities: no Pedal edema, no calf tenderness Neurology: Grossly no focal neuro deficit.  Data Reviewed: CBC:  Recent Labs Lab 06/15/15 0602 06/18/15 0838 06/20/15 1055  WBC 7.2 5.1 5.7  NEUTROABS  --   --  5.0  HGB 10.8* 9.9* 11.7*  HCT 34.0* 30.9* 37.3*  MCV 85.9 85.1 86.5  PLT 196 149* 214   Basic Metabolic Panel:  Recent Labs Lab 06/14/15 0600  06/15/15 0602 06/18/15 0838 06/20/15 1055  NA 138 141 145 143  K 4.3 3.7 3.4* 3.9  CL 107 107 113* 108  CO2 16* GLUCOSE 172* 95 90 100*  BUN 9 <5* 7 <5*  CREATININE 0.98 0.80 0.79 0.86  CALCIUM 9.0 8.6* 8.6* 8.6*  MG 1.9  --   --   --     Liver Function Tests:  Recent Labs Lab 06/20/15 1055  AST 19  ALT 12*  ALKPHOS 66  BILITOT 1.0    PROT 5.9*  ALBUMIN 3.3*   No results for input(s): LIPASE, AMYLASE in the last 168 hours. No results for input(s): AMMONIA in the last 168 hours. Coagulation Profile: No results for input(s): INR, PROTIME in the last 168 hours. Cardiac Enzymes: No results for input(s): CKTOTAL, CKMB, CKMBINDEX, TROPONINI in the last 168 hours. BNP (last 3 results) No results for input(s): PROBNP in the last 8760 hours.  CBG: No results for input(s): GLUCAP in the last 168 hours.  Studies: Dg Abd Acute W/chest  06/20/2015  CLINICAL DATA:  Patient with history of Crohn's disease. Unable to keep food down. Left-sided abdominal pain with nausea vomiting and diarrhea. EXAM: DG ABDOMEN ACUTE W/ 1V CHEST COMPARISON:  Abdominal radiograph 05/25/2015 FINDINGS: Normal cardiac and mediastinal contours. No consolidative pulmonary opacities. No pleural effusion or pneumothorax. Surgical clips left upper quadrant. Unchanged nonspecific prominent loop of bowel within the right hemi abdomen. No free intraperitoneal air. IMPRESSION: Unchanged gaseous distended loop of featureless bowel within the right hemi abdomen, nonspecific in etiology. No acute cardiopulmonary process. Electronically Signed   By: Annia Belt M.D.   On: 06/20/2015 10:44     Scheduled Meds: . azaTHIOprine  175 mg Oral Daily  . dicyclomine  10 mg Oral TID AC  . gabapentin  800 mg Oral TID  . heparin subcutaneous  5,000 Units Subcutaneous Q8H  . hydrocortisone  25 mg Rectal BID  . ketorolac  30 mg Intravenous Q8H  . lamoTRIgine  25 mg Oral BID  . loperamide  2 mg Oral QID  . nicotine  21 mg Transdermal Daily  . ondansetron  4 mg Oral Q6H  . prazosin  1 mg Oral QHS  . predniSONE  40 mg Oral Q breakfast  . sertraline  50 mg Oral Daily   Continuous Infusions: . sodium chloride 75 mL/hr at 06/20/15 1816   PRN Meds: acetaminophen **OR** acetaminophen, LORazepam, oxyCODONE-acetaminophen  Time spent: 30 minutes  Author: Lynden Oxford, MD [facility] Pager: [phone] 06/20/2015 6:29 PM  If 7PM-7AM, please contact night-coverage at [email or web address], password [facility]

## 2015-06-21 DIAGNOSIS — F3132 Bipolar disorder, current episode depressed, moderate: Secondary | ICD-10-CM | POA: Diagnosis not present

## 2015-06-21 DIAGNOSIS — F329 Major depressive disorder, single episode, unspecified: Secondary | ICD-10-CM | POA: Diagnosis not present

## 2015-06-21 DIAGNOSIS — F419 Anxiety disorder, unspecified: Secondary | ICD-10-CM | POA: Diagnosis not present

## 2015-06-21 DIAGNOSIS — K50019 Crohn's disease of small intestine with unspecified complications: Secondary | ICD-10-CM | POA: Diagnosis not present

## 2015-06-21 MED ORDER — LORAZEPAM 2 MG/ML IJ SOLN: 0.5000 mg | Freq: Four times a day (QID) | INTRAMUSCULAR | Status: DC | PRN

## 2015-06-21 MED ORDER — FAMOTIDINE 20 MG PO TABS
20.0000 mg | ORAL_TABLET | Freq: Two times a day (BID) | ORAL | Status: DC
  Administered 2015-06-21 – 2015-06-30 (×18): 20 mg via ORAL
  Filled 2015-06-21 (×19): qty 1

## 2015-06-21 MED ORDER — NAPROXEN 500 MG PO TABS: 500.0000 mg | ORAL_TABLET | Freq: Three times a day (TID) | ORAL | Status: DC

## 2015-06-21 MED ORDER — OXYCODONE-ACETAMINOPHEN 5-325 MG PO TABS
1.0000 | ORAL_TABLET | Freq: Four times a day (QID) | ORAL | Status: DC | PRN
  Administered 2015-06-21: 1 via ORAL
  Filled 2015-06-21: qty 1

## 2015-06-21 MED ORDER — PREDNISONE 10 MG PO TABS: ORAL_TABLET | ORAL | Status: DC

## 2015-06-21 MED ORDER — LOPERAMIDE HCL 2 MG PO CAPS: 2.0000 mg | ORAL_CAPSULE | ORAL | Status: DC | PRN

## 2015-06-21 MED ORDER — DICYCLOMINE HCL 10 MG PO CAPS: 10.0000 mg | ORAL_CAPSULE | Freq: Three times a day (TID) | ORAL | Status: DC

## 2015-06-21 MED ORDER — OXYCODONE-ACETAMINOPHEN 5-325 MG PO TABS
1.0000 | ORAL_TABLET | ORAL | Status: DC | PRN
  Administered 2015-06-21 – 2015-06-26 (×23): 1 via ORAL
  Filled 2015-06-21 (×24): qty 1

## 2015-06-21 MED ORDER — FAMOTIDINE 20 MG PO TABS: 20.0000 mg | ORAL_TABLET | Freq: Two times a day (BID) | ORAL | Status: DC

## 2015-06-21 MED ORDER — HYDROCORTISONE ACETATE 25 MG RE SUPP: 25.0000 mg | Freq: Two times a day (BID) | RECTAL | Status: DC

## 2015-06-21 MED ORDER — LORAZEPAM 0.5 MG PO TABS
0.5000 mg | ORAL_TABLET | Freq: Once | ORAL | Status: AC
  Administered 2015-06-22: 0.5 mg via ORAL
  Filled 2015-06-21 (×2): qty 1

## 2015-06-21 MED ORDER — NAPROXEN 250 MG PO TABS
500.0000 mg | ORAL_TABLET | Freq: Three times a day (TID) | ORAL | Status: DC
Start: 2015-06-21 — End: 2015-06-26
  Administered 2015-06-21 – 2015-06-25 (×13): 500 mg via ORAL
  Filled 2015-06-21 (×13): qty 2

## 2015-06-21 NOTE — Progress Notes (Signed)
Clinical Social Work  MD requested to IVC pt due to his report that he wanted to leave AMA and would jump in front of traffic. MD signed IVC paperwork which was faxed to Magistrate's office. CSW spoke with Magistrate who confirmed information was received and that pt would be served.  Unk Lightning, LCSW Weekend Coverage

## 2015-06-21 NOTE — Progress Notes (Signed)
[facility] Progress Note  Patient: Andre [name] YQM:[id number]   PCP: No PCP Per Patient DOB: 09/09/1985   DOA: 06/14/2015   DOS: 06/21/2015   Date of Service: the patient was seen and examined on 06/21/2015 Outpatient Specialists: none  Subjective: No evidence of diarrhea or vomiting reported in last 24 hours. Pain is also adequately controlled. Patient was initially scheduled to be discharged at home. On discharge patient requested a refill of pain medication which he already had at home recently on April 22 and he has not refilled and therefore patient was informed that no new refills will be provided until he refills the earlier prescription that he had. Patient mentioned to the RN that he would throw himself in front of a bus after leaving the hospital to kill himself and was significantly anxious and agitated. Security was called and IVC signed. Psychiatric consulted who will follow up in the morning.   Nutrition: Was on full liquid diet  Brief hospital course: Patient was admitted on 06/14/2015, with complaint of abdominal pain nausea and vomiting, was found to have mild Crohn's flareup. GI was consulted. Patient initiated on steroid. Scheduled Imodium was also added by GI. GI recommended to avoid escalating narcotics. The patient is suicidal and therefore he will remain in the hospital until psychiatry evaluates him. Currently further plan is to await recommendation from psychiatric  Assessment and Plan: 1. Crohn's disease (HCC) Patient complains of multiple diarrheas and vomiting although it is not documented. Patient also clinically does not appear dehydrated. Lab work also does not suggest any evidence of GI loss or dehydration. With this patient complains of significant pain and requires pain medication frequently. GI has been consulted and recommended to avoid escalating further narcotics. Continue current prednisone continue imuran. Continue been to continue naproxen  continue Pepcid continue OxyIR every 4 hours. We will not escalate narcotics since there is no indication for higher dose.  2. Anxiety. Continuing Ativan. Continuing Lamictal. Continue Zoloft. When necessary IV lorazepam  3. Chronic pain management. Continuing Neurontin.  4. Suicidal ideation. After patient learned that he will not get a new prescription for oxycodone he informed that he will throw himself in front of a bus and would like to kill him. Patient already has a prescription available from his recent ER visit which he should be able to refill. With patient's suicidal ideation as well as trying to leave the hospital after making the threat, IVC papers were signed. Psychiatry was consulted who will follow-up with the patient tomorrow morning.  Activity: Currently independent Bowel regimen: last BM 06/20/2015 Diet: Soft diet DVT Prophylaxis: subcutaneous Heparin  Advance goals of care discussion: Full code  Family Communication: no family was present at bedside, at the time of interview.   Disposition:  Expected discharge date: 05/23/2015 Barriers to safe discharge: Psychiatric consultation  Consultants: Gastroenterology, psychiatry Procedures: None  Antibiotics: Anti-infectives    None        Intake/Output Summary (Last 24 hours) at 06/21/15 1919 Last data filed at 06/21/15 1629  Gross per 24 hour  Intake 2005.75 ml  Output    550 ml  Net 1455.75 ml   Filed Weights   06/14/15 0438 06/14/15 0904  Weight: 68.04 kg (150 lb) 68.04 kg (150 lb)    Objective: Physical Exam: Filed Vitals:   06/20/15 1441 06/20/15 2129 06/21/15 0630 06/21/15 1434  BP: 129/85 129/77 113/63 132/78  Pulse: 62 74 68 81  Temp: 98.4 F (36.9 C) 98.9 F (37.2 C) 97.7 F (  36.5 C) 99.1 F (37.3 C)  TempSrc: Oral Oral Oral Oral  Resp: 18 18 17 17   Height:      Weight:      SpO2: 100% 100% 100% 100%     General: Appear in mild distress, no Rash; Oral Mucosa  moist. Cardiovascular: S1 and S2 Present, no Murmur, no JVD Respiratory: Bilateral Air entry present and Clear to Auscultation, no Crackles, no wheezes Abdomen: Bowel Sound [present, Soft and mild tenderness Extremities: no Pedal edema, no calf tenderness Neurology: Significant anxiety  Data Reviewed: CBC:  Recent Labs Lab 06/15/15 0602 06/18/15 0838 06/20/15 1055  WBC 7.2 5.1 5.7  NEUTROABS  --   --  5.0  HGB 10.8* 9.9* 11.7*  HCT 34.0* 30.9* 37.3*  MCV 85.9 85.1 86.5  PLT 196 149* 214   Basic Metabolic Panel:  Recent Labs Lab 06/15/15 0602 06/18/15 0838 06/20/15 1055  NA 141 145 143  K 3.7 3.4* 3.9  CL 107 113* 108  CO2 22 24 27   GLUCOSE 95 90 100*  BUN <5* 7 <5*  CREATININE 0.80 0.79 0.86  CALCIUM 8.6* 8.6* 8.6*    Liver Function Tests:  Recent Labs Lab 06/20/15 1055  AST 19  ALT 12*  ALKPHOS 66  BILITOT 1.0  PROT 5.9*  ALBUMIN 3.3*   No results for input(s): LIPASE, AMYLASE in the last 168 hours. No results for input(s): AMMONIA in the last 168 hours. Coagulation Profile: No results for input(s): INR, PROTIME in the last 168 hours. Cardiac Enzymes: No results for input(s): CKTOTAL, CKMB, CKMBINDEX, TROPONINI in the last 168 hours. BNP (last 3 results) No results for input(s): PROBNP in the last 8760 hours.  CBG: No results for input(s): GLUCAP in the last 168 hours.  Studies: No results found.   Scheduled Meds: . azaTHIOprine  175 mg Oral Daily  . dicyclomine  10 mg Oral TID AC  . famotidine  20 mg Oral BID  . gabapentin  800 mg Oral TID  . heparin subcutaneous  5,000 Units Subcutaneous Q8H  . hydrocortisone  25 mg Rectal BID  . lamoTRIgine  25 mg Oral BID  . loperamide  2 mg Oral QID  . naproxen  500 mg Oral TID WC  . nicotine  21 mg Transdermal Daily  . ondansetron  4 mg Oral Q6H  . prazosin  1 mg Oral QHS  . predniSONE  40 mg Oral Q breakfast  . sertraline  50 mg Oral Daily   Continuous Infusions:   PRN Meds: acetaminophen  **OR** acetaminophen, LORazepam, oxyCODONE-acetaminophen  Time spent: 30 minutes  Author: Lynden Oxford, MD [facility] Pager: [phone] 06/21/2015 4:30 PM  If 7PM-7AM, please contact night-coverage at [email or web address], password [facility]

## 2015-06-21 NOTE — Progress Notes (Signed)
Pt refused to get his IV restarted. On Cal made aware. Will continue to monitor.

## 2015-06-21 NOTE — Progress Notes (Signed)
Suicide sitter at the bedside

## 2015-06-21 NOTE — Progress Notes (Addendum)
RN went into patient's room to discharge patient and explain that he will not be going home with a prescription for percocet, as he already has a prescription that has not been filled, per pharmacy.  Patient then stated to nurse, "I am going to kill myself." The nurse asked the patient to explain why he feels this way. The patient then got out of the bed and put his shirt on and said, " I'm leaving." RN attempted to explore patient's feelings about wanting to kill himself. Patient then said "I am going to walk out in front of a bus when I leave here." RN asked patient to stay in the room until the MD could be called. MD came to see patient and patient was involuntarily committed.

## 2015-06-22 DIAGNOSIS — G8929 Other chronic pain: Secondary | ICD-10-CM | POA: Diagnosis present

## 2015-06-22 DIAGNOSIS — Z885 Allergy status to narcotic agent status: Secondary | ICD-10-CM | POA: Diagnosis not present

## 2015-06-22 DIAGNOSIS — Z881 Allergy status to other antibiotic agents status: Secondary | ICD-10-CM | POA: Diagnosis not present

## 2015-06-22 DIAGNOSIS — K5080 Crohn's disease of both small and large intestine without complications: Secondary | ICD-10-CM | POA: Diagnosis present

## 2015-06-22 DIAGNOSIS — Z9049 Acquired absence of other specified parts of digestive tract: Secondary | ICD-10-CM | POA: Diagnosis not present

## 2015-06-22 DIAGNOSIS — D649 Anemia, unspecified: Secondary | ICD-10-CM | POA: Diagnosis present

## 2015-06-22 DIAGNOSIS — Z888 Allergy status to other drugs, medicaments and biological substances status: Secondary | ICD-10-CM | POA: Diagnosis not present

## 2015-06-22 DIAGNOSIS — K5090 Crohn's disease, unspecified, without complications: Secondary | ICD-10-CM | POA: Diagnosis present

## 2015-06-22 DIAGNOSIS — K50019 Crohn's disease of small intestine with unspecified complications: Secondary | ICD-10-CM | POA: Diagnosis not present

## 2015-06-22 DIAGNOSIS — E876 Hypokalemia: Secondary | ICD-10-CM | POA: Diagnosis present

## 2015-06-22 DIAGNOSIS — F3132 Bipolar disorder, current episode depressed, moderate: Secondary | ICD-10-CM | POA: Diagnosis not present

## 2015-06-22 DIAGNOSIS — R45851 Suicidal ideations: Secondary | ICD-10-CM | POA: Diagnosis not present

## 2015-06-22 DIAGNOSIS — Z7952 Long term (current) use of systemic steroids: Secondary | ICD-10-CM | POA: Diagnosis not present

## 2015-06-22 DIAGNOSIS — F419 Anxiety disorder, unspecified: Secondary | ICD-10-CM | POA: Diagnosis present

## 2015-06-22 DIAGNOSIS — F319 Bipolar disorder, unspecified: Secondary | ICD-10-CM | POA: Diagnosis present

## 2015-06-22 DIAGNOSIS — F17200 Nicotine dependence, unspecified, uncomplicated: Secondary | ICD-10-CM | POA: Diagnosis present

## 2015-06-22 MED ORDER — LORAZEPAM 0.5 MG PO TABS
0.5000 mg | ORAL_TABLET | ORAL | Status: DC | PRN
  Administered 2015-06-22 – 2015-06-30 (×17): 0.5 mg via ORAL
  Filled 2015-06-22 (×17): qty 1

## 2015-06-22 MED ORDER — BISMUTH SUBSALICYLATE 262 MG/15ML PO SUSP
30.0000 mL | ORAL | Status: DC | PRN
  Filled 2015-06-22: qty 118

## 2015-06-22 NOTE — Progress Notes (Signed)
[facility] Progress Note  Patient: Andre [name] ZOX:[id number]   PCP: No PCP Per Patient DOB: 1985/09/02   DOA: 06/14/2015   DOS: 06/22/2015   Date of Service: the patient was seen and examined on 06/22/2015 Outpatient Specialists: none  Subjective: No evidence of agitation. Patient continues to request oral narcotics. Patient has mentioned that he had 4 loose watery bowel movement. No vomiting.  Nutrition: Tolerating soft diet  Brief hospital course: Patient was admitted on 06/14/2015, with complaint of abdominal pain nausea and vomiting, was found to have mild Crohn's flareup. GI was consulted. Patient initiated on steroid. Scheduled Imodium was also added by GI. GI recommended to avoid escalating narcotics. The patient is suicidal and therefore he will remain in the hospital until psychiatry evaluates him. Currently further plan is to await recommendation from psychiatric  Assessment and Plan: 1. Crohn's disease (HCC) Patient complains of multiple diarrheas and vomiting although it is not documented. Patient also clinically does not appear dehydrated. Lab work also does not suggest any evidence of GI loss or dehydration. With this patient complains of significant pain and requires pain medication frequently. GI has been consulted and recommended to avoid escalating further narcotics. Continue current prednisone continue imuran. Continue been to continue naproxen continue Pepcid continue OxyIR every 4 hours. We will not escalate narcotics since there is no indication for higher dose. Pepto-Bismol added for diarrhea  2. Anxiety. Continuing Ativan. Continuing Lamictal. Continue Zoloft. When necessary IV lorazepam  3. Chronic pain management. Continuing Neurontin.  4. Suicidal ideation. After patient learned that he will not get a new prescription for oxycodone he informed that he will throw himself in front of a bus and would like to kill him. Patient already has a  prescription available from his recent ER visit which he should be able to refill. With patient's suicidal ideation as well as trying to leave the hospital after making the threat, IVC papers were signed. Psychiatry was consulted who will follow-up with the patient tomorrow morning. Awaiting recommendations from psychiatry. Continue IVC.  Activity: Currently independent Bowel regimen: last BM me 03/13/2015 Diet: Soft diet DVT Prophylaxis: subcutaneous Heparin  Advance goals of care discussion: Full code  Family Communication: no family was present at bedside, at the time of interview.   Disposition:  Expected discharge date: 06/23/2015 Barriers to safe discharge: Psychiatric consultation  Consultants: Gastroenterology, psychiatry Procedures: None  Antibiotics: Anti-infectives    None        Intake/Output Summary (Last 24 hours) at 06/22/15 1848 Last data filed at 06/22/15 1442  Gross per 24 hour  Intake   1080 ml  Output     11 ml  Net   1069 ml   Filed Weights   06/14/15 0438 06/14/15 0904  Weight: 68.04 kg (150 lb) 68.04 kg (150 lb)    Objective: Physical Exam: Filed Vitals:   06/21/15 1434 06/21/15 2057 06/22/15 0525 06/22/15 1438  BP: 132/78 126/87 113/70 126/79  Pulse: 81 57 59 108  Temp: 99.1 F (37.3 C) 98.8 F (37.1 C) 97.6 F (36.4 C) 99.1 F (37.3 C)  TempSrc: Oral Oral Oral Oral  Resp: Height:      Weight:      SpO2: 100% 99% 100% 97%    General: Appear in mild distress, no Rash;  Cardiovascular: S1 and S2 Present, no Murmur, no JVD Respiratory: Bilateral Air entry present and Clear to Auscultation, no Crackles, no wheezes Abdomen: Bowel Sound [present, Extremities: no Pedal edema,  no calf tenderness  Data Reviewed: CBC:  Recent Labs Lab 06/18/15 0838 06/20/15 1055  WBC 5.1 5.7  NEUTROABS  --  5.0  HGB 9.9* 11.7*  HCT 30.9* 37.3*  MCV 85.1 86.5  PLT 149* 214   Basic Metabolic Panel:  Recent Labs Lab 06/18/15 0838  06/20/15 1055  NA 145 143  K 3.4* 3.9  CL 113* 108  CO2 24 27  GLUCOSE 90 100*  BUN 7 <5*  CREATININE 0.79 0.86  CALCIUM 8.6* 8.6*    Liver Function Tests:  Recent Labs Lab 06/20/15 1055  AST 19  ALT 12*  ALKPHOS 66  BILITOT 1.0  PROT 5.9*  ALBUMIN 3.3*   No results for input(s): LIPASE, AMYLASE in the last 168 hours. No results for input(s): AMMONIA in the last 168 hours. Coagulation Profile: No results for input(s): INR, PROTIME in the last 168 hours. Cardiac Enzymes: No results for input(s): CKTOTAL, CKMB, CKMBINDEX, TROPONINI in the last 168 hours. BNP (last 3 results) No results for input(s): PROBNP in the last 8760 hours.  CBG: No results for input(s): GLUCAP in the last 168 hours.  Studies: No results found.   Scheduled Meds: . azaTHIOprine  175 mg Oral Daily  . dicyclomine  10 mg Oral TID AC  . famotidine  20 mg Oral BID  . gabapentin  800 mg Oral TID  . heparin subcutaneous  5,000 Units Subcutaneous Q8H  . hydrocortisone  25 mg Rectal BID  . lamoTRIgine  25 mg Oral BID  . loperamide  2 mg Oral QID  . naproxen  500 mg Oral TID WC  . nicotine  21 mg Transdermal Daily  . ondansetron  4 mg Oral Q6H  . prazosin  1 mg Oral QHS  . predniSONE  40 mg Oral Q breakfast  . sertraline  50 mg Oral Daily   Continuous Infusions:   PRN Meds: acetaminophen **OR** acetaminophen, bismuth subsalicylate, LORazepam, oxyCODONE-acetaminophen  Time spent: 30 minutes  Author: Lynden Oxford, MD [facility] Pager: [phone] 06/22/2015 6:48 PM  If 7PM-7AM, please contact night-coverage at [email or web address], password [facility]

## 2015-06-23 DIAGNOSIS — F314 Bipolar disorder, current episode depressed, severe, without psychotic features: Secondary | ICD-10-CM | POA: Diagnosis not present

## 2015-06-23 DIAGNOSIS — R45851 Suicidal ideations: Secondary | ICD-10-CM | POA: Diagnosis not present

## 2015-06-23 DIAGNOSIS — K50019 Crohn's disease of small intestine with unspecified complications: Secondary | ICD-10-CM | POA: Diagnosis not present

## 2015-06-23 MED ORDER — OXYCODONE-ACETAMINOPHEN 5-325 MG PO TABS
2.0000 | ORAL_TABLET | Freq: Once | ORAL | Status: AC
  Administered 2015-06-23: 2 via ORAL
  Filled 2015-06-23: qty 2

## 2015-06-23 MED ORDER — OXYCODONE-ACETAMINOPHEN 5-325 MG PO TABS
1.0000 | ORAL_TABLET | Freq: Once | ORAL | Status: AC
  Administered 2015-06-23: 1 via ORAL
  Filled 2015-06-23: qty 1

## 2015-06-23 NOTE — Progress Notes (Signed)
[facility] Progress Note  Patient: Andre [name] SHU:[id number]   PCP: No PCP Per Patient DOB: [birth year]   DOA: 06/14/2015   DOS: 06/23/2015   Date of Service: the patient was seen and examined on 06/23/2015 Outpatient Specialists: none  Subjective: Patient continues to request oral narcotics while in between requesting the narcotics the patient is pleasant does not have any pain and is communicating walking in the room. Patient can to distal mentions that he has large loose stool every time he goes to the bathroom per documentation is "per patient". Nutrition: Tolerating soft diet  Brief hospital course: Patient was admitted on 06/14/2015, with complaint of abdominal pain nausea and vomiting, was found to have mild Crohn's flareup. GI was consulted. Patient initiated on steroid. Scheduled Imodium was also added by GI. GI recommended to avoid escalating narcotics. The patient is suicidal and psychiatric recommends inpatient hospitalization. Awaiting placement. Currently further plan is to await recommendation from psychiatry.   Assessment and Plan: 1. Crohn's disease ([place])  Prior history of subtotal colectomy with chronic loose stool Patient complains of multiple diarrheas. No vomiting. Minimally elevated CRP. Normal ESR. Patient also clinically does not appear dehydrated. Lab work also does not suggest any evidence of GI loss or dehydration. With this patient complains of significant pain and requires pain medication frequently. GI has been consulted and recommended to avoid escalating further narcotics. Continue current prednisone continue imuran. Continue been to continue naproxen continue Pepcid continue OxyIR every 4 hours. We will not escalate narcotics since there is no indication for higher dose. Dicyclomine discontinue has diarrhea can possibly be a side effect of it. Pepto-Bismol added for diarrhea  2. Anxiety. Bipolar affective disorder Suicidal ideation. After patient  learned that he will not get a new prescription for oxycodone he informed that he will throw himself in front of a bus and would kill him. Patient was recently hospitalized at [facility] for suicidal ideation Patient already has a prescription available from his recent ER visit which he should be able to refill. With patient's suicidal ideation as well as trying to leave the hospital after making the threat, IVC papers were signed.  Psychiatry was consulted who recommends inpatient psychiatry. Awaiting placement. Continue IVC.Continuing Ativan. Continuing Lamictal. Continue Zoloft.  3. Chronic pain management. Continuing Neurontin.  Activity: Currently independent Bowel regimen: last BM 06/23/2015 Diet: Soft diet DVT Prophylaxis: subcutaneous Heparin  Advance goals of care discussion: Full code  Family Communication: no family was present at bedside, at the time of interview.   Disposition:  Expected discharge date: 06/24/2015 Barriers to safe discharge: Psychiatric placement  Consultants: Gastroenterology, psychiatry Procedures: None  Antibiotics: Anti-infectives    None        Intake/Output Summary (Last 24 hours) at 06/23/15 1904 Last data filed at 06/23/15 1857  Gross per 24 hour  Intake   1800 ml  Output     26 ml  Net   1774 ml   Filed Weights   06/14/15 0438 06/14/15 0904  Weight: 68.04 kg (150 lb) 68.04 kg (150 lb)    Objective: Physical Exam: Filed Vitals:   06/22/15 1438 06/22/15 2132 06/23/15 0551 06/23/15 1302  BP: 126/79 127/70 123/79 123/77  Pulse: 108 54 56 72  Temp: 99.1 F (37.3 C) 98.7 F (37.1 C) 97.7 F (36.5 C) 99.1 F (37.3 C)  TempSrc: Oral Oral Oral Oral  Resp: 20 20 18 20   Height:      Weight:      SpO2: 97% 99% 97%  98%    General: Appear in mild distress, no Rash;  Cardiovascular: S1 and S2 Present, no Murmur, no JVD Respiratory: Bilateral Air entry present and Clear to Auscultation, no Crackles, no wheezes Abdomen: Bowel Sound  [present, Extremities: no Pedal edema, no calf tenderness  Data Reviewed: CBC:  Recent Labs Lab 06/18/15 0838 06/20/15 1055  WBC 5.1 5.7  NEUTROABS  --  5.0  HGB 9.9* 11.7*  HCT 30.9* 37.3*  MCV 85.1 86.5  PLT 149* 343   Basic Metabolic Panel:  Recent Labs Lab 06/18/15 0838 06/20/15 1055  NA 145 143  K 3.4* 3.9  CL 113* 108  CO2 24 27  GLUCOSE 90 100*  BUN 7 <5*  CREATININE 0.79 0.86  CALCIUM 8.6* 8.6*    Liver Function Tests:  Recent Labs Lab 06/20/15 1055  AST 19  ALT 12*  ALKPHOS 66  BILITOT 1.0  PROT 5.9*  ALBUMIN 3.3*   No results for input(s): LIPASE, AMYLASE in the last 168 hours. No results for input(s): AMMONIA in the last 168 hours. Coagulation Profile: No results for input(s): INR, PROTIME in the last 168 hours. Cardiac Enzymes: No results for input(s): CKTOTAL, CKMB, CKMBINDEX, TROPONINI in the last 168 hours. BNP (last 3 results) No results for input(s): PROBNP in the last 8760 hours.  CBG: No results for input(s): GLUCAP in the last 168 hours.  Studies: No results found.   Scheduled Meds: . azaTHIOprine  175 mg Oral Daily  . dicyclomine  10 mg Oral TID AC  . famotidine  20 mg Oral BID  . gabapentin  800 mg Oral TID  . heparin subcutaneous  5,000 Units Subcutaneous Q8H  . hydrocortisone  25 mg Rectal BID  . lamoTRIgine  25 mg Oral BID  . loperamide  2 mg Oral QID  . naproxen  500 mg Oral TID WC  . nicotine  21 mg Transdermal Daily  . ondansetron  4 mg Oral Q6H  . prazosin  1 mg Oral QHS  . predniSONE  40 mg Oral Q breakfast  . sertraline  50 mg Oral Daily   Continuous Infusions:   PRN Meds: acetaminophen **OR** acetaminophen, bismuth subsalicylate, LORazepam, oxyCODONE-acetaminophen  Time spent: 30 minutes  Author: Berle Mull, MD [facility] Pager: [phone] 06/23/2015 7:04 PM  If 7PM-7AM, please contact night-coverage at [email or web address], password [facility]

## 2015-06-23 NOTE — Consult Note (Addendum)
[facility] Face-to-Face Psychiatry Consult   Reason for Consult:  Depression and suicide ideation Referring Physician:  Dr. Allena Katz Patient Identification: Andre [name] MRN:  [id number] Principal Diagnosis: Bipolar affective disorder [facility]) Diagnosis:   Patient Active Problem List   Diagnosis Date Noted  . Diarrhea [R19.7]   . Crohn's disease (HCC) [K50.90] 06/14/2015  . Depression [F32.9] 06/14/2015  . Anxiety [F41.9] 06/14/2015  . Suicidal ideations [R45.851] 05/31/2015  . Exacerbation of Crohn's disease (HCC) [K50.90] 05/27/2015  . Hypokalemia [E87.6] 05/27/2015  . Crohn's disease of both small and large intestine without complication (HCC) [K50.80]   . LLQ abdominal pain [R10.32]   . 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 Chronic back pain greater than 3 months duration [M54.9, G89.29] 11/17/2014  . Tobacco dependence [F17.200] 11/17/2014  . Abdominal pain [R10.9] 11/05/2014  . Metabolic acidosis, increased anion gap (IAG) [E87.2] 11/05/2014  . Crohn's colitis (HCC) [K50.10] 10/26/2014  . Acute hypokalemia [E87.6] 10/26/2014  . Acute hyperglycemia [R73.09] 10/26/2014  . Refractory nausea and vomiting [R11.2] 10/26/2014  . Dehydration, moderate [E86.0] 10/26/2014  . Microcytic anemia [D50.9] 10/26/2014  . Bipolar affective disorder (HCC) [F31.9] 10/26/2014  . Anxiety disorder [F41.9] 10/26/2014  . Acute Crohn's disease (HCC) [K50.90] 10/26/2014    Total Time spent with patient: 1 hour  Subjective:   Andre [name] is a 30 y.o. male patient admitted with depression, suicide ideation and on IVC for threatening to leave with suicide ideation.  HPI:  Andre [name] is a 30 y.o. male, seen, chart reviewed and case discussed with the Dr. Allena Katz for the face-to-face psychiatric consultation and evaluation of increased symptoms of depression and suicidal ideation with the plan of running into traffic to kill himself. Patient reportedly suffering with bipolar affective disorder and recently increased  symptoms of depression. Patient also reported he has been compliant with his medication and has outpatient psychiatric appointment but could not make it secondary to being physically sick. Patient stated when he made a suicide statement yesterday he really mean it and he worried he is going to kill himself and cannot contract outside the hospital. Patient reportedly came to [place] one half month ago from [place] because he could not get along with his mother and aunt's. Patient was recently admitted to behavioral [facility] and reportedly did well over. Patient is willing to be admitted to psychiatric floor for crisis stabilization, safety monitoring on medication management for bipolar depression. Patient is also suffering with a longstanding history of ileocolonic Crohn's disease, s/p multiple bowel resections. 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   Medical history Patient presented to [facility] in Sept 2016 with Crohn's flare symptoms (he was living in [place] at the time), CT scan in ED just prior to admission revealed inflammatory changes near ileocolonic anastomosis. Patient seen by [place] [facility] who treated him with Solumedrol and flagyl and continuation of home Azathiopurine. Flex sigmoidoscopy revealed stricture and ulceration of distal rectum. Biopsies of ileocolonic anastomosis were negative. 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 CMV negative. Patient's abdominal pain felt to be multifactorial (neuropathic, narcotic bowel and Crohn's).   Past Psychiatric History: Bipolar affective disorder and recent admission to behavioral health Hospital.  Risk to Self: Is patient at risk for suicide?: No Risk to Others:   Prior Inpatient Therapy:   Prior Outpatient Therapy:    Past Medical History:  Past Medical History  Diagnosis Date  . Anxiety   . Bipolar depression (HCC)   . Crohn disease (HCC) 1997  .  Anemia     Around 2013 required PRBC transfusions. Has  received parenteral iron in past. Has taken oral iron in past.  . Crohn's disease [facility])     Past Surgical History  Procedure Laterality Date  . Subtotal colectomy      Has undergone a total of 3 separate bowel resections including terminal ileal resection and the equivalent of subtotal colectomy. Last surgery was in 2010.  Marland Kitchen Flexible sigmoidoscopy N/A 10/29/2014    Procedure: FLEXIBLE SIGMOIDOSCOPY;  Surgeon: Ruffin Frederick, MD;  Location: [facility] ENDOSCOPY;  Service: Gastroenterology;  Laterality: N/A;   Family History:  Family History  Problem Relation Age of Onset  . Hypertension Father    Family Psychiatric  History: Unknown Social History:  History  Alcohol Use No     History  Drug Use No    Social History   Social History  . Marital Status: Single    Spouse Name: N/A  . Number of Children: N/A  . Years of Education: N/A   Social History Main Topics  . Smoking status: Current Every Day Smoker -- 0.50 packs/day for 5 years  . Smokeless tobacco: Never Used  . Alcohol Use: No  . Drug Use: No  . Sexual Activity: Not Currently   Other Topics Concern  . None   Social History Narrative   Disabled   Single   Says he has moved to [place]   06/14/2015      Additional Social History:Reportedly patient lives with a friend in [place].    Allergies:   Allergies  Allergen Reactions  . Lithium Other (See Comments)    Toxicity   . Remicade [Infliximab] Anaphylaxis  . Compazine [Prochlorperazine] Other (See Comments)    agitation  . Ketorolac Other (See Comments)     GI Distress  . Morphine Other (See Comments)     GI Distress  . Nsaids Other (See Comments)    Gi distress  . Ciprofloxacin Rash  . Haldol [Haloperidol Lactate] Anxiety  . Metronidazole Rash    Labs: No results found for this or any previous visit (from the past 48 hour(s)).  Current Facility-Administered Medications  Medication Dose Route Frequency Provider Last Rate Last Dose  .  acetaminophen (TYLENOL) tablet 650 mg  650 mg Oral Q6H PRN Meredith Pel, NP   650 mg at 06/14/15 1019   Or  . acetaminophen (TYLENOL) suppository 650 mg  650 mg Rectal Q6H PRN Meredith Pel, NP      . azaTHIOprine Tommas Olp) tablet 175 mg  175 mg Oral Daily Meredith Pel, NP   175 mg at 06/23/15 1015  . bismuth subsalicylate (PEPTO BISMOL) 262 MG/15ML suspension 30 mL  30 mL Oral Q4H PRN Rolly Salter, MD      . dicyclomine (BENTYL) capsule 10 mg  10 mg Oral TID [facility] Rolly Salter, MD   10 mg at 06/23/15 1131  . famotidine (PEPCID) tablet 20 mg  20 mg Oral BID Rolly Salter, MD   20 mg at 06/23/15 1016  . gabapentin (NEURONTIN) capsule 800 mg  800 mg Oral TID Meredith Pel, NP   800 mg at 06/23/15 1015  . heparin injection 5,000 Units  5,000 Units Subcutaneous Q8H Standley Brooking, MD   5,000 Units at 06/20/15 2103  . hydrocortisone (ANUSOL-HC) suppository 25 mg  25 mg Rectal BID Princella Pellegrini Zehr, PA-C   25 mg at 06/23/15 1016  . lamoTRIgine (LAMICTAL) tablet 25 mg  25 mg Oral BID Meredith Pel, NP   25 mg at 06/23/15 1015  . loperamide (IMODIUM) capsule 2 mg  2 mg Oral QID Standley Brooking, MD   2 mg at 06/23/15 1015  . LORazepam (ATIVAN) tablet 0.5 mg  0.5 mg Oral Q4H PRN Rolly Salter, MD   0.5 mg at 06/22/15 2206  . naproxen (NAPROSYN) tablet 500 mg  500 mg Oral TID WC Rolly Salter, MD   500 mg at 06/23/15 1131  . nicotine (NICODERM CQ - dosed in mg/24 hours) patch 21 mg  21 mg Transdermal Daily Meredith Pel, NP   21 mg at 06/23/15 1016  . ondansetron (ZOFRAN-ODT) disintegrating tablet 4 mg  4 mg Oral Q6H Standley Brooking, MD   4 mg at 06/23/15 0737  . oxyCODONE-acetaminophen (PERCOCET/ROXICET) 5-325 MG per tablet 1 tablet  1 tablet Oral Q4H PRN Rolly Salter, MD   1 tablet at 06/23/15 1131  . prazosin (MINIPRESS) capsule 1 mg  1 mg Oral QHS Meredith Pel, NP   1 mg at 06/22/15 2123  . predniSONE (DELTASONE) tablet 40 mg  40 mg Oral Q breakfast Standley Brooking, MD    40 mg at 06/23/15 0737  . sertraline (ZOLOFT) tablet 50 mg  50 mg Oral Daily Meredith Pel, NP   50 mg at 06/23/15 1015    Musculoskeletal: Strength & Muscle Tone: within normal limits Gait & Station: normal Patient leans: N/A  Psychiatric Specialty Exam: ROS  Blood pressure 123/79, pulse 56, temperature 97.7 F (36.5 C), temperature source Oral, resp. rate 18, height 5\' 2"  (1.575 m), weight 68.04 kg (150 lb), SpO2 97 %.Body mass index is 27.43 kg/(m^2).  General Appearance: Casual  Eye Contact::  Good  Speech:  Clear and Coherent  Volume:  Decreased  Mood:  Depressed and Worthless  Affect:  Constricted and Depressed  Thought Process:  Coherent and Goal Directed  Orientation:  Full (Time, Place, and Person)  Thought Content:  Rumination  Suicidal Thoughts:  Yes.  with intent/plan  Homicidal Thoughts:  No  Memory:  Immediate;   Good Recent;   Fair Remote;   Fair  Judgement:  Impaired  Insight:  Fair  Psychomotor Activity:  Decreased  Concentration:  Fair  Recall:  Good  Fund of Knowledge:Good  Language: Good  Akathisia:  Negative  Handed:  Right  AIMS (if indicated):     Assets:  Communication Skills Desire for Improvement Financial Resources/Insurance Housing Leisure Time Resilience Social Support Talents/Skills  ADL's:  Impaired  Cognition: WNL  Sleep:      Treatment Plan Summary: Daily contact with patient to assess and evaluate symptoms and progress in treatment and Medication management  Patent attorney as patient cannot contract for safety Continue lamotrigine 25 mmol twice daily for mood swings and Zoloft 50 mg daily for depression and anxiety Referred to the unit social service regarding inpatient psychiatric placement.  Disposition: Recommend psychiatric Inpatient admission when medically cleared. Supportive therapy provided about ongoing stressors.  Nehemiah Settle., MD 06/23/2015 11:58 AM

## 2015-06-23 NOTE — Care Management Important Message (Signed)
Important Message  Patient Details  Name: Trygve Thal MRN: [id number] Date of Birth: [birth year]   Medicare Important Message Given:  Yes    Eyad Rochford P Alverna Fawley 06/23/2015, 1:34 PM

## 2015-06-23 NOTE — Progress Notes (Signed)
Patient refused to ambulate in the hall

## 2015-06-24 ENCOUNTER — Encounter: Payer: Self-pay | Admitting: Gastroenterology

## 2015-06-24 DIAGNOSIS — K50019 Crohn's disease of small intestine with unspecified complications: Secondary | ICD-10-CM

## 2015-06-24 DIAGNOSIS — F17200 Nicotine dependence, unspecified, uncomplicated: Secondary | ICD-10-CM | POA: Diagnosis not present

## 2015-06-24 DIAGNOSIS — R45851 Suicidal ideations: Secondary | ICD-10-CM | POA: Diagnosis not present

## 2015-06-24 MED ORDER — OXYCODONE-ACETAMINOPHEN 5-325 MG PO TABS
1.0000 | ORAL_TABLET | Freq: Once | ORAL | Status: AC
  Administered 2015-06-24: 1 via ORAL
  Filled 2015-06-24: qty 1

## 2015-06-24 NOTE — Clinical Social Work Psych Note (Signed)
Psych CSW now following.  Disposition: Inpatient psychiatric hospitalization  Referrals sent to:  [facility]  [place], [place] [id number]  2H 1-14; Hospital Psychiatric Service Line Licensed Clinical Social Worker

## 2015-06-24 NOTE — Progress Notes (Signed)
PROGRESS NOTE    Andre [name]  NAT:[id number] DOB: [birth year] DOA: 06/14/2015 PCP: No PCP Per Patient   Outpatient Specialists:    Brief Narrative:  Patient was admitted on 06/14/2015, with complaint of abdominal pain nausea and vomiting, was found to have mild Crohn's flareup. GI was consulted. Patient initiated on steroid. Scheduled Imodium was also added by GI. GI recommended to avoid escalating narcotics. The patient is suicidal and psychiatric recommends inpatient hospitalization.  Awaiting placement.   Assessment & Plan:   Principal Problem:   Crohn's disease (HCC) Active Problems:   Bipolar affective disorder (HCC)   Tobacco dependence   Hypokalemia   LLQ abdominal pain   Suicidal ideations   Depression   Anxiety   1. Crohn's disease (HCC) GI has been consulted and recommended to avoid escalating further narcotics. Continue current prednisone continue imuran. Continue been to continue naproxen continue Pepcid continue OxyIR every 4 hours. We will not escalate narcotics since there is no indication for higher dose.  2. Anxiety. Continuing Ativan. Continuing Lamictal. Continue Zoloft. When necessary IV lorazepam  3. Chronic pain management. Continuing Neurontin.  4. Suicidal ideation. After patient learned that he will not get a new prescription for oxycodone he informed that he will throw himself in front of a bus and would like to kill him. Patient already has a prescription available from his recent ER visit which he should be able to refill. With patient's suicidal ideation as well as trying to leave the hospital after making the threat, IVC papers were signed.  Psych recommending inpt treatment -has sitter   DVT prophylaxis:  lovenox  Code Status: Full Code   Family Communication: No family  Disposition Plan:  inpt psych   Consultants:   Psych  GI  Procedures:        Subjective: C/o pain but appears in no  distress  Objective: Filed Vitals:   06/23/15 0551 06/23/15 1302 06/23/15 2231 06/24/15 0600  BP: 123/79 123/77 135/7 117/61  Pulse: 56 72 54 45  Temp: 97.7 F (36.5 C) 99.1 F (37.3 C) 98.7 F (37.1 C) 97.9 F (36.6 C)  TempSrc: Oral Oral Oral Oral  Resp: 18 20 20 18   Height:      Weight:      SpO2: 97% 98% 99% 99%    Intake/Output Summary (Last 24 hours) at 06/24/15 1108 Last data filed at 06/24/15 0453  Gross per 24 hour  Intake   1770 ml  Output     18 ml  Net   1752 ml   Filed Weights   06/14/15 0438 06/14/15 0904  Weight: 68.04 kg (150 lb) 68.04 kg (150 lb)    Examination:  General exam: Appears calm and comfortable  Respiratory system: Clear to auscultation. Respiratory effort normal. Cardiovascular system: S1 & S2 heard, RRR. No JVD, murmurs, rubs, gallops or clicks. No pedal edema. Gastrointestinal system: Abdomen is nondistended, soft and  Minimally tender. No organomegaly or masses felt. Normal bowel sounds heard. Central nervous system: Alert and oriented. No focal neurological deficits. Extremities: Symmetric 5 x 5 power. Skin: No rashes, lesions or ulcers Psychiatry: Judgement and insight appear normal. Mood & affect appropriate.     Data Reviewed: I have personally reviewed following labs and imaging studies  CBC:  Recent Labs Lab 06/18/15 0838 06/20/15 1055  WBC 5.1 5.7  NEUTROABS  --  5.0  HGB 9.9* 11.7*  HCT 30.9* 37.3*  MCV 85.1 86.5  PLT 149* 214   Basic Metabolic Panel:  Recent Labs Lab 06/18/15 0838 06/20/15 1055  NA 145 143  K 3.4* 3.9  CL 113* 108  CO2 24 27  GLUCOSE 90 100*  BUN 7 <5*  CREATININE 0.79 0.86  CALCIUM 8.6* 8.6*   GFR: Estimated Creatinine Clearance: 108.5 mL/min (by C-G formula based on Cr of 0.86). Liver Function Tests:  Recent Labs Lab 06/20/15 1055  AST 19  ALT 12*  ALKPHOS 66  BILITOT 1.0  PROT 5.9*  ALBUMIN 3.3*   No results for input(s): LIPASE, AMYLASE in the last 168 hours. No  results for input(s): AMMONIA in the last 168 hours. Coagulation Profile: No results for input(s): INR, PROTIME in the last 168 hours. Cardiac Enzymes: No results for input(s): CKTOTAL, CKMB, CKMBINDEX, TROPONINI in the last 168 hours. BNP (last 3 results) No results for input(s): PROBNP in the last 8760 hours. HbA1C: No results for input(s): HGBA1C in the last 72 hours. CBG: No results for input(s): GLUCAP in the last 168 hours. Lipid Profile: No results for input(s): CHOL, HDL, LDLCALC, TRIG, CHOLHDL, LDLDIRECT in the last 72 hours. Thyroid Function Tests: No results for input(s): TSH, T4TOTAL, FREET4, T3FREE, THYROIDAB in the last 72 hours. Anemia Panel: No results for input(s): VITAMINB12, FOLATE, FERRITIN, TIBC, IRON, RETICCTPCT in the last 72 hours. Urine analysis:    Component Value Date/Time   COLORURINE YELLOW 05/26/2015 2130   APPEARANCEUR CLEAR 05/26/2015 2130   LABSPEC 1.025 05/26/2015 2130   PHURINE 6.5 05/26/2015 2130   GLUCOSEU NEGATIVE 05/26/2015 2130   HGBUR NEGATIVE 05/26/2015 2130   BILIRUBINUR NEGATIVE 05/26/2015 2130   KETONESUR NEGATIVE 05/26/2015 2130   PROTEINUR NEGATIVE 05/26/2015 2130   UROBILINOGEN 0.2 11/15/2014 0905   NITRITE NEGATIVE 05/26/2015 2130   LEUKOCYTESUR NEGATIVE 05/26/2015 2130    Recent Results (from the past 240 hour(s))  C difficile quick scan w PCR reflex     Status: None   Collection Time: 06/14/15 12:18 PM  Result Value Ref Range Status   C Diff antigen NEGATIVE NEGATIVE Final   C Diff toxin NEGATIVE NEGATIVE Final   C Diff interpretation Negative for toxigenic C. difficile  Final      Anti-infectives    None       Radiology Studies: No results found.      Scheduled Meds: . azaTHIOprine  175 mg Oral Daily  . famotidine  20 mg Oral BID  . gabapentin  800 mg Oral TID  . heparin subcutaneous  5,000 Units Subcutaneous Q8H  . hydrocortisone  25 mg Rectal BID  . lamoTRIgine  25 mg Oral BID  . loperamide  2 mg  Oral QID  . naproxen  500 mg Oral TID WC  . nicotine  21 mg Transdermal Daily  . ondansetron  4 mg Oral Q6H  . prazosin  1 mg Oral QHS  . predniSONE  40 mg Oral Q breakfast  . sertraline  50 mg Oral Daily   Continuous Infusions:    LOS: 9 days    Time spent: 25 min    Andre Langille [name], DO [facility] Pager [phone]  If 7PM-7AM, please contact night-coverage [email or web address] Password [email or web address] 06/24/2015, 11:08 AM

## 2015-06-25 DIAGNOSIS — F329 Major depressive disorder, single episode, unspecified: Secondary | ICD-10-CM

## 2015-06-25 DIAGNOSIS — F419 Anxiety disorder, unspecified: Secondary | ICD-10-CM | POA: Diagnosis not present

## 2015-06-25 DIAGNOSIS — E876 Hypokalemia: Secondary | ICD-10-CM | POA: Diagnosis not present

## 2015-06-25 DIAGNOSIS — K5080 Crohn's disease of both small and large intestine without complications: Secondary | ICD-10-CM | POA: Diagnosis not present

## 2015-06-25 DIAGNOSIS — K50019 Crohn's disease of small intestine with unspecified complications: Secondary | ICD-10-CM | POA: Diagnosis not present

## 2015-06-25 DIAGNOSIS — K5090 Crohn's disease, unspecified, without complications: Secondary | ICD-10-CM | POA: Diagnosis not present

## 2015-06-25 LAB — OCCULT BLOOD X 1 CARD TO LAB, STOOL: FECAL OCCULT BLD: POSITIVE — AB

## 2015-06-25 MED ORDER — OXYCODONE-ACETAMINOPHEN 5-325 MG PO TABS
1.0000 | ORAL_TABLET | Freq: Once | ORAL | Status: AC
  Administered 2015-06-25: 1 via ORAL
  Filled 2015-06-25: qty 1

## 2015-06-25 NOTE — Progress Notes (Addendum)
Pt complaining of abdominal cramping like pain 10/10 as well as blood in stool. Pt's already given PRN Perco with no relief. Requesting for "one time order of percocet". Andre Poag NP made aware. New orders received. X1 perco was given and hemoccult was collected and sent to lab. Will continue to monitor pt.

## 2015-06-25 NOTE — Progress Notes (Signed)
PROGRESS NOTE    Andre [name]  ZOX:[id number] DOB: [birth year] DOA: 06/14/2015 PCP: No PCP Per Patient   Outpatient Specialists:    Brief Narrative:  Patient was admitted on 06/14/2015, with complaint of abdominal pain nausea and vomiting, was found to have mild Crohn's flareup. GI was consulted. Patient initiated on steroid. Scheduled Imodium was also added by GI. GI recommended to avoid escalating narcotics. The patient is suicidal and psychiatric recommends inpatient hospitalization.  Awaiting placement.   Assessment & Plan:   Principal Problem:   Crohn's disease (HCC) Active Problems:   Bipolar affective disorder (HCC)   Tobacco dependence   Hypokalemia   LLQ abdominal pain   Suicidal ideations   Depression   Anxiety   1. Crohn's disease (HCC) GI has been consulted and recommended to avoid escalating further narcotics. Continue current prednisone continue imuran. Continue been to continue naproxen continue Pepcid continue OxyIR every 4 hours. We will not escalate narcotics since there is no indication for higher dose.  2. Anxiety. Continuing Ativan. Continuing Lamictal. Continue Zoloft. When necessary IV lorazepam  3. Chronic pain management. Continuing Neurontin.  4. Suicidal ideation. After patient learned that he will not get a new prescription for oxycodone he informed that he will throw himself in front of a bus and would like to kill him. Patient already has a prescription available from his recent ER visit which he should be able to refill. With patient's suicidal ideation as well as trying to leave the hospital after making the threat, IVC papers were signed.  Psych recommending inpt treatment -has sitter   DVT prophylaxis:  lovenox  Code Status: Full Code   Family Communication: No family  Disposition Plan:  inpt psych   Consultants:   Psych  GI  Procedures:        Subjective: Bowels are lessening and more solid per  pateint  Objective: Filed Vitals:   06/24/15 0600 06/24/15 1450 06/24/15 2202 06/25/15 0518  BP: 117/61 129/80 122/74 104/60  Pulse: 45 91 65 63  Temp: 97.9 F (36.6 C) 98.6 F (37 C) 98.7 F (37.1 C) 97.8 F (36.6 C)  TempSrc: Oral Oral Oral Oral  Resp: 18 20 20 18   Height:      Weight:      SpO2: 99% 96% 98% 100%    Intake/Output Summary (Last 24 hours) at 06/25/15 1125 Last data filed at 06/25/15 0700  Gross per 24 hour  Intake   1200 ml  Output      9 ml  Net   1191 ml   Filed Weights   06/14/15 0438 06/14/15 0904  Weight: 68.04 kg (150 lb) 68.04 kg (150 lb)    Examination:  General exam: Appears calm and comfortable  Respiratory system: Clear to auscultation. Respiratory effort normal. Cardiovascular system: S1 & S2 heard, RRR. No JVD, murmurs, rubs, gallops or clicks. No pedal edema. Gastrointestinal system: Abdomen is nondistended, soft and  Minimally tender. No organomegaly or masses felt. Normal bowel sounds heard.    Data Reviewed: I have personally reviewed following labs and imaging studies  CBC:  Recent Labs Lab 06/20/15 1055  WBC 5.7  NEUTROABS 5.0  HGB 11.7*  HCT 37.3*  MCV 86.5  PLT 214   Basic Metabolic Panel:  Recent Labs Lab 06/20/15 1055  NA 143  K 3.9  CL 108  CO2 27  GLUCOSE 100*  BUN <5*  CREATININE 0.86  CALCIUM 8.6*   GFR: Estimated Creatinine Clearance: 108.5 mL/min (by C-G formula  based on Cr of 0.86). Liver Function Tests:  Recent Labs Lab 06/20/15 1055  AST 19  ALT 12*  ALKPHOS 66  BILITOT 1.0  PROT 5.9*  ALBUMIN 3.3*   No results for input(s): LIPASE, AMYLASE in the last 168 hours. No results for input(s): AMMONIA in the last 168 hours. Coagulation Profile: No results for input(s): INR, PROTIME in the last 168 hours. Cardiac Enzymes: No results for input(s): CKTOTAL, CKMB, CKMBINDEX, TROPONINI in the last 168 hours. BNP (last 3 results) No results for input(s): PROBNP in the last 8760  hours. HbA1C: No results for input(s): HGBA1C in the last 72 hours. CBG: No results for input(s): GLUCAP in the last 168 hours. Lipid Profile: No results for input(s): CHOL, HDL, LDLCALC, TRIG, CHOLHDL, LDLDIRECT in the last 72 hours. Thyroid Function Tests: No results for input(s): TSH, T4TOTAL, FREET4, T3FREE, THYROIDAB in the last 72 hours. Anemia Panel: No results for input(s): VITAMINB12, FOLATE, FERRITIN, TIBC, IRON, RETICCTPCT in the last 72 hours. Urine analysis:    Component Value Date/Time   COLORURINE YELLOW 05/26/2015 2130   APPEARANCEUR CLEAR 05/26/2015 2130   LABSPEC 1.025 05/26/2015 2130   PHURINE 6.5 05/26/2015 2130   GLUCOSEU NEGATIVE 05/26/2015 2130   HGBUR NEGATIVE 05/26/2015 2130   BILIRUBINUR NEGATIVE 05/26/2015 2130   KETONESUR NEGATIVE 05/26/2015 2130   PROTEINUR NEGATIVE 05/26/2015 2130   UROBILINOGEN 0.2 11/15/2014 0905   NITRITE NEGATIVE 05/26/2015 2130   LEUKOCYTESUR NEGATIVE 05/26/2015 2130    No results found for this or any previous visit (from the past 240 hour(s)).    Anti-infectives    None       Radiology Studies: No results found.      Scheduled Meds: . azaTHIOprine  175 mg Oral Daily  . famotidine  20 mg Oral BID  . gabapentin  800 mg Oral TID  . heparin subcutaneous  5,000 Units Subcutaneous Q8H  . hydrocortisone  25 mg Rectal BID  . lamoTRIgine  25 mg Oral BID  . loperamide  2 mg Oral QID  . naproxen  500 mg Oral TID WC  . nicotine  21 mg Transdermal Daily  . ondansetron  4 mg Oral Q6H  . prazosin  1 mg Oral QHS  . predniSONE  40 mg Oral Q breakfast  . sertraline  50 mg Oral Daily   Continuous Infusions:    LOS: 10 days    Time spent: 25 min    Andre [name] Andre Gosling, DO [facility] Pager [phone]  If 7PM-7AM, please contact night-coverage [email or web address] Password [email or web address] 06/25/2015, 11:25 AM

## 2015-06-26 DIAGNOSIS — K50019 Crohn's disease of small intestine with unspecified complications: Secondary | ICD-10-CM | POA: Diagnosis not present

## 2015-06-26 DIAGNOSIS — F17200 Nicotine dependence, unspecified, uncomplicated: Secondary | ICD-10-CM | POA: Diagnosis not present

## 2015-06-26 DIAGNOSIS — R45851 Suicidal ideations: Secondary | ICD-10-CM | POA: Diagnosis not present

## 2015-06-26 DIAGNOSIS — F314 Bipolar disorder, current episode depressed, severe, without psychotic features: Secondary | ICD-10-CM | POA: Diagnosis not present

## 2015-06-26 DIAGNOSIS — K5080 Crohn's disease of both small and large intestine without complications: Secondary | ICD-10-CM | POA: Diagnosis not present

## 2015-06-26 DIAGNOSIS — K5090 Crohn's disease, unspecified, without complications: Secondary | ICD-10-CM | POA: Diagnosis not present

## 2015-06-26 LAB — CBC
HEMATOCRIT: 36.4 % — AB (ref 39.0–52.0)
HEMOGLOBIN: 11.2 g/dL — AB (ref 13.0–17.0)
MCH: 26.5 pg (ref 26.0–34.0)
MCHC: 30.8 g/dL (ref 30.0–36.0)
MCV: 86.3 fL (ref 78.0–100.0)
Platelets: 213 10*3/uL (ref 150–400)
RBC: 4.22 MIL/uL (ref 4.22–5.81)
RDW: 15.4 % (ref 11.5–15.5)
WBC: 8.8 10*3/uL (ref 4.0–10.5)

## 2015-06-26 LAB — BASIC METABOLIC PANEL
ANION GAP: 14 (ref 5–15)
BUN: 8 mg/dL (ref 6–20)
CALCIUM: 8.8 mg/dL — AB (ref 8.9–10.3)
CHLORIDE: 107 mmol/L (ref 101–111)
CO2: 24 mmol/L (ref 22–32)
CREATININE: 0.7 mg/dL (ref 0.61–1.24)
GFR calc non Af Amer: >60 mL/min (ref >60)
Glucose, Bld: 103 mg/dL — ABNORMAL HIGH (ref 65–99)
Potassium: 3.2 mmol/L — ABNORMAL LOW (ref 3.5–5.1)
SODIUM: 145 mmol/L (ref 135–145)

## 2015-06-26 MED ORDER — POTASSIUM CHLORIDE CRYS ER 20 MEQ PO TBCR
40.0000 meq | EXTENDED_RELEASE_TABLET | Freq: Once | ORAL | Status: AC
  Administered 2015-06-26: 40 meq via ORAL
  Filled 2015-06-26: qty 2

## 2015-06-26 MED ORDER — ACETAMINOPHEN 325 MG PO TABS
650.0000 mg | ORAL_TABLET | Freq: Four times a day (QID) | ORAL | Status: DC
  Administered 2015-06-26 – 2015-06-30 (×17): 650 mg via ORAL
  Filled 2015-06-26 (×17): qty 2

## 2015-06-26 MED ORDER — OXYCODONE HCL 5 MG PO TABS
5.0000 mg | ORAL_TABLET | ORAL | Status: DC | PRN
  Administered 2015-06-26 – 2015-06-30 (×22): 5 mg via ORAL
  Filled 2015-06-26 (×23): qty 1

## 2015-06-26 NOTE — Progress Notes (Signed)
PROGRESS NOTE    Andre [name]  ZOX:[id number] DOB: [birth year] DOA: 06/14/2015 PCP: No PCP Per Patient   Outpatient Specialists:    Brief Narrative:  Patient was admitted on 06/14/2015, with complaint of abdominal pain nausea and vomiting, was found to have mild Crohn's flareup. GI was consulted. Patient initiated on steroid. Scheduled Imodium was also added by GI. GI recommended to avoid escalating narcotics. The patient is suicidal and psychiatric recommends inpatient hospitalization.  Awaiting placement.   Assessment & Plan:   Principal Problem:   Crohn's disease (HCC) Active Problems:   Bipolar affective disorder (HCC)   Tobacco dependence   Hypokalemia   LLQ abdominal pain   Suicidal ideations   Depression   Anxiety   1. Crohn's disease (HCC) GI has been consulted and recommended to avoid escalating further narcotics. Continue current prednisone continue imuran. continue Pepcid continue OxyIR every 4 hours. We will not escalate narcotics since there is no indication for higher dose. -schedule tylenol -heme +: spoke with GI-- nothing else to change or add-- CBC stable  2. Anxiety. Continuing Ativan. Continuing Lamictal. Continue Zoloft. When necessary IV lorazepam  3. Chronic pain management. Continuing Neurontin.  4. Suicidal ideation. After patient learned that he will not get a new prescription for oxycodone he informed that he will throw himself in front of a bus and would like to kill him. Patient already has a prescription available from his recent ER visit which he should be able to refill. With patient's suicidal ideation as well as trying to leave the hospital after making the threat, IVC papers were signed.  Psych recommending inpt treatment -has sitter   5. Hypokalemia -replete  DVT prophylaxis:  lovenox  Code Status: Full Code   Family Communication: No family  Disposition Plan:  inpt psych- AWAIT PLACEMENT   Consultants:    Psych  GI  Procedures:        Subjective: Asking for more pain meds-- appears comfortable in bed  Objective: Filed Vitals:   06/25/15 0518 06/25/15 1353 06/25/15 2108 06/26/15 0627  BP: 104/60 127/67 132/80 100/57  Pulse: 63 80 72 64  Temp: 97.8 F (36.6 C) 98.7 F (37.1 C) 98.4 F (36.9 C) 97.8 F (36.6 C)  TempSrc: Oral Oral Oral Oral  Resp: Height:      Weight:      SpO2: 100% 100% 96% 99%    Intake/Output Summary (Last 24 hours) at 06/26/15 1054 Last data filed at 06/26/15 0909  Gross per 24 hour  Intake    480 ml  Output    302 ml  Net    178 ml   Filed Weights   06/14/15 0438 06/14/15 0904  Weight: 68.04 kg (150 lb) 68.04 kg (150 lb)    Examination:  General exam: Appears calm and comfortable  Respiratory system: Clear to auscultation. Respiratory effort normal. Cardiovascular system: S1 & S2 heard, RRR. No JVD, murmurs, rubs, gallops or clicks. No pedal edema. Gastrointestinal system: Abdomen is nondistended, soft and  Minimally tender. No organomegaly or masses felt. Normal bowel sounds heard.    Data Reviewed: I have personally reviewed following labs and imaging studies  CBC:  Recent Labs Lab 06/20/15 1055 06/26/15 0622  WBC 5.7 8.8  NEUTROABS 5.0  --   HGB 11.7* 11.2*  HCT 37.3* 36.4*  MCV 86.5 86.3  PLT 214 213   Basic Metabolic Panel:  Recent Labs Lab 06/20/15 1055 06/26/15 0622  NA 143 145  K  3.9 3.2*  CL 108 107  CO2 27 24  GLUCOSE 100* 103*  BUN <5* 8  CREATININE 0.86 0.70  CALCIUM 8.6* 8.8*   GFR: Estimated Creatinine Clearance: 116.7 mL/min (by C-G formula based on Cr of 0.7). Liver Function Tests:  Recent Labs Lab 06/20/15 1055  AST 19  ALT 12*  ALKPHOS 66  BILITOT 1.0  PROT 5.9*  ALBUMIN 3.3*   No results for input(s): LIPASE, AMYLASE in the last 168 hours. No results for input(s): AMMONIA in the last 168 hours. Coagulation Profile: No results for input(s): INR, PROTIME in the last  168 hours. Cardiac Enzymes: No results for input(s): CKTOTAL, CKMB, CKMBINDEX, TROPONINI in the last 168 hours. BNP (last 3 results) No results for input(s): PROBNP in the last 8760 hours. HbA1C: No results for input(s): HGBA1C in the last 72 hours. CBG: No results for input(s): GLUCAP in the last 168 hours. Lipid Profile: No results for input(s): CHOL, HDL, LDLCALC, TRIG, CHOLHDL, LDLDIRECT in the last 72 hours. Thyroid Function Tests: No results for input(s): TSH, T4TOTAL, FREET4, T3FREE, THYROIDAB in the last 72 hours. Anemia Panel: No results for input(s): VITAMINB12, FOLATE, FERRITIN, TIBC, IRON, RETICCTPCT in the last 72 hours. Urine analysis:    Component Value Date/Time   COLORURINE YELLOW 05/26/2015 2130   APPEARANCEUR CLEAR 05/26/2015 2130   LABSPEC 1.025 05/26/2015 2130   PHURINE 6.5 05/26/2015 2130   GLUCOSEU NEGATIVE 05/26/2015 2130   HGBUR NEGATIVE 05/26/2015 2130   BILIRUBINUR NEGATIVE 05/26/2015 2130   KETONESUR NEGATIVE 05/26/2015 2130   PROTEINUR NEGATIVE 05/26/2015 2130   UROBILINOGEN 0.2 11/15/2014 0905   NITRITE NEGATIVE 05/26/2015 2130   LEUKOCYTESUR NEGATIVE 05/26/2015 2130    No results found for this or any previous visit (from the past 240 hour(s)).    Anti-infectives    None       Radiology Studies: No results found.      Scheduled Meds: . acetaminophen  650 mg Oral Q6H  . azaTHIOprine  175 mg Oral Daily  . famotidine  20 mg Oral BID  . gabapentin  800 mg Oral TID  . heparin subcutaneous  5,000 Units Subcutaneous Q8H  . hydrocortisone  25 mg Rectal BID  . lamoTRIgine  25 mg Oral BID  . nicotine  21 mg Transdermal Daily  . ondansetron  4 mg Oral Q6H  . prazosin  1 mg Oral QHS  . predniSONE  40 mg Oral Q breakfast  . sertraline  50 mg Oral Daily   Continuous Infusions:    LOS: 11 days    Time spent: 25 min    Klyn Kroening [name], DO [facility] Pager [phone]  If 7PM-7AM, please contact  night-coverage [email or web address] Password [email or web address] 06/26/2015, 10:54 AM

## 2015-06-26 NOTE — Care Management Note (Signed)
Case Management Note  Patient Details  Name: Jebb Aukamp MRN: [id number] Date of Birth: [birth year]  Subjective/Objective:                    Action/Plan:   Expected Discharge Date:  06/19/15               [facility]  In-House Referral:  Clinical Social Work  Discharge planning Services     Post Acute Care Choice:    Choice offered to:     DME Arranged:    DME Agency:     HH Arranged:    HH Agency:     Status of Service:  In process, will continue to follow  Medicare Important Message Given:  Yes Date Medicare IM Given:    Medicare IM give by:    Date Additional Medicare IM Given:    Additional Medicare Important Message give by:     If discussed at Long Length of Stay Meetings, dates discussed:    Additional Comments:  Kingsley Plan, RN 06/26/2015, 11:20 AM

## 2015-06-26 NOTE — Progress Notes (Signed)
Patient complaining of pain 10/10. PRN Oxy IR given with no relief per patient, requesting a one time dose of additional pain medication. Claiborne Billings, NP paged and returned call. The decision was made not to give anymore narcotics at this time. Will continue to monitor. Cindee Salt, RN

## 2015-06-26 NOTE — Progress Notes (Signed)
After explaining to patient that no more pain medication could be given at this time, he became very upset and stated "this is out of control and I will be leaving either later tonight or in the morning. Go ahead and bring my papers to sign". Patient did agree to take 0.5mg  of Ativan. Will continue to monitor. Cindee Salt, RN

## 2015-06-26 NOTE — Progress Notes (Signed)
Pt's hemoccult came back positive. Donnamarie Poag NP notified. New orders received. Will continue to monitor

## 2015-06-27 DIAGNOSIS — F314 Bipolar disorder, current episode depressed, severe, without psychotic features: Secondary | ICD-10-CM | POA: Diagnosis not present

## 2015-06-27 DIAGNOSIS — K50019 Crohn's disease of small intestine with unspecified complications: Secondary | ICD-10-CM | POA: Diagnosis not present

## 2015-06-27 NOTE — Progress Notes (Signed)
Pt requested pain medicine. Pulled a 5mg  oxy ir as was covering for pt's primary nurse. Primary nurse returned to unit and given the pain med to administer

## 2015-06-27 NOTE — Progress Notes (Signed)
PROGRESS NOTE    Andre [name]  SFK:[id number] DOB: [birth year] DOA: 06/14/2015 PCP: No PCP Per Patient   Outpatient Specialists:    Brief Narrative:  Patient was admitted on 06/14/2015, with complaint of abdominal pain nausea and vomiting, was found to have mild Crohn's flareup. GI was consulted. Patient initiated on steroid. Scheduled Imodium was also added by GI. GI recommended to avoid escalating narcotics. The patient is suicidal and psychiatric recommends inpatient hospitalization.  Awaiting placement.   Assessment & Plan:   Principal Problem:   Crohn's disease (HCC) Active Problems:   Bipolar affective disorder (HCC)   Tobacco dependence   Hypokalemia   LLQ abdominal pain   Suicidal ideations   Depression   Anxiety   1. Crohn's disease (HCC) GI was consulted and recommended to avoid escalating further narcotics. Continue current prednisone; continue imuran. continue Pepcid  continue OxyIR every 4 hours. We will not escalate narcotics since there is no indication for higher dose. -schedule tylenol -heme +: spoke with GI-- nothing else to change or add-- CBC stable  2. Anxiety. Continuing Ativan. Continuing Lamictal. Continue Zoloft. When necessary IV lorazepam  3. Chronic pain management. Continuing Neurontin.  4. Suicidal ideation. After patient learned that he will not get a new prescription for oxycodone he informed that he will throw himself in front of a bus and would like to kill him. Patient already has a prescription available from his recent ER visit which he should be able to refill. With patient's suicidal ideation as well as trying to leave the hospital after making the threat, IVC papers were signed.  Psych recommending inpt treatment -has sitter   5. Hypokalemia -repleted  DVT prophylaxis:  lovenox  Code Status: Full Code   Family Communication: No family  Disposition Plan:  inpt psych- AWAIT PLACEMENT   Consultants:    Psych  GI  Procedures:        Subjective: Feels the same as yesterday  Objective: Filed Vitals:   06/26/15 1333 06/26/15 2146 06/27/15 0500 06/27/15 0538  BP: 137/85 134/68  115/70  Pulse: 96 82  65  Temp: 98.9 F (37.2 C) 98.8 F (37.1 C)  97.7 F (36.5 C)  TempSrc: Oral Oral Oral   Resp: 18 17  18   Height:      Weight:      SpO2: 98% 98%  100%    Intake/Output Summary (Last 24 hours) at 06/27/15 1205 Last data filed at 06/27/15 0900  Gross per 24 hour  Intake    960 ml  Output    602 ml  Net    358 ml   Filed Weights   06/14/15 0438 06/14/15 0904  Weight: 68.04 kg (150 lb) 68.04 kg (150 lb)    Examination:  General exam: Appears calm and comfortable  Respiratory system: Clear to auscultation. Respiratory effort normal. Cardiovascular system: S1 & S2 heard, RRR. No JVD, murmurs, rubs, gallops or clicks. No pedal edema. Gastrointestinal system: Abdomen is nondistended, soft and  Minimally tender. No organomegaly or masses felt. Normal bowel sounds heard.    Data Reviewed: I have personally reviewed following labs and imaging studies  CBC:  Recent Labs Lab 06/26/15 0622  WBC 8.8  HGB 11.2*  HCT 36.4*  MCV 86.3  PLT 213   Basic Metabolic Panel:  Recent Labs Lab 06/26/15 0622  NA 145  K 3.2*  CL 107  CO2 24  GLUCOSE 103*  BUN 8  CREATININE 0.70  CALCIUM 8.8*   GFR: Estimated  Creatinine Clearance: 116.7 mL/min (by C-G formula based on Cr of 0.7). Liver Function Tests: No results for input(s): AST, ALT, ALKPHOS, BILITOT, PROT, ALBUMIN in the last 168 hours. No results for input(s): LIPASE, AMYLASE in the last 168 hours. No results for input(s): AMMONIA in the last 168 hours. Coagulation Profile: No results for input(s): INR, PROTIME in the last 168 hours. Cardiac Enzymes: No results for input(s): CKTOTAL, CKMB, CKMBINDEX, TROPONINI in the last 168 hours. BNP (last 3 results) No results for input(s): PROBNP in the last 8760  hours. HbA1C: No results for input(s): HGBA1C in the last 72 hours. CBG: No results for input(s): GLUCAP in the last 168 hours. Lipid Profile: No results for input(s): CHOL, HDL, LDLCALC, TRIG, CHOLHDL, LDLDIRECT in the last 72 hours. Thyroid Function Tests: No results for input(s): TSH, T4TOTAL, FREET4, T3FREE, THYROIDAB in the last 72 hours. Anemia Panel: No results for input(s): VITAMINB12, FOLATE, FERRITIN, TIBC, IRON, RETICCTPCT in the last 72 hours. Urine analysis:    Component Value Date/Time   COLORURINE YELLOW 05/26/2015 2130   APPEARANCEUR CLEAR 05/26/2015 2130   LABSPEC 1.025 05/26/2015 2130   PHURINE 6.5 05/26/2015 2130   GLUCOSEU NEGATIVE 05/26/2015 2130   HGBUR NEGATIVE 05/26/2015 2130   BILIRUBINUR NEGATIVE 05/26/2015 2130   KETONESUR NEGATIVE 05/26/2015 2130   PROTEINUR NEGATIVE 05/26/2015 2130   UROBILINOGEN 0.2 11/15/2014 0905   NITRITE NEGATIVE 05/26/2015 2130   LEUKOCYTESUR NEGATIVE 05/26/2015 2130    No results found for this or any previous visit (from the past 240 hour(s)).    Anti-infectives    None       Radiology Studies: No results found.      Scheduled Meds: . acetaminophen  650 mg Oral Q6H  . azaTHIOprine  175 mg Oral Daily  . famotidine  20 mg Oral BID  . gabapentin  800 mg Oral TID  . heparin subcutaneous  5,000 Units Subcutaneous Q8H  . hydrocortisone  25 mg Rectal BID  . lamoTRIgine  25 mg Oral BID  . nicotine  21 mg Transdermal Daily  . ondansetron  4 mg Oral Q6H  . prazosin  1 mg Oral QHS  . predniSONE  40 mg Oral Q breakfast  . sertraline  50 mg Oral Daily   Continuous Infusions:    LOS: 12 days    Time spent: 15 min    Makiah Clauson [name], DO [facility] Pager [phone]  If 7PM-7AM, please contact night-coverage [email or web address] Password [email or web address] 06/27/2015, 12:05 PM

## 2015-06-27 NOTE — Progress Notes (Signed)
CSW spoke with [facility] re: current referral for patient.  He indicated that patient is a former patient of [facility] and has had chronic pain issues in the past as well. [facility] will not be able to offer a bed for patient.  He stated he would be monitoring for any possible bed offers for patient. Discussed above with Dr. Benjamine Mola who indicated that patient could d/c for further psychiatric care once bed is located.Lupita Leash T. Jaci Lazier, LCSW [phone]  (weekend coverage)

## 2015-06-28 DIAGNOSIS — F314 Bipolar disorder, current episode depressed, severe, without psychotic features: Secondary | ICD-10-CM | POA: Diagnosis not present

## 2015-06-28 DIAGNOSIS — E876 Hypokalemia: Secondary | ICD-10-CM | POA: Diagnosis not present

## 2015-06-28 DIAGNOSIS — K5080 Crohn's disease of both small and large intestine without complications: Secondary | ICD-10-CM | POA: Diagnosis not present

## 2015-06-28 DIAGNOSIS — K5090 Crohn's disease, unspecified, without complications: Secondary | ICD-10-CM | POA: Diagnosis not present

## 2015-06-28 DIAGNOSIS — R45851 Suicidal ideations: Secondary | ICD-10-CM | POA: Diagnosis not present

## 2015-06-28 DIAGNOSIS — K50019 Crohn's disease of small intestine with unspecified complications: Secondary | ICD-10-CM | POA: Diagnosis not present

## 2015-06-28 LAB — BASIC METABOLIC PANEL
ANION GAP: 10 (ref 5–15)
BUN: 8 mg/dL (ref 6–20)
CO2: 26 mmol/L (ref 22–32)
Calcium: 8.9 mg/dL (ref 8.9–10.3)
Chloride: 106 mmol/L (ref 101–111)
Creatinine, Ser: 0.77 mg/dL (ref 0.61–1.24)
GFR calc Af Amer: >60 mL/min (ref >60)
Glucose, Bld: 118 mg/dL — ABNORMAL HIGH (ref 65–99)
POTASSIUM: 3.1 mmol/L — AB (ref 3.5–5.1)
SODIUM: 142 mmol/L (ref 135–145)

## 2015-06-28 LAB — CBC
HCT: 36.8 % — ABNORMAL LOW (ref 39.0–52.0)
Hemoglobin: 11.3 g/dL — ABNORMAL LOW (ref 13.0–17.0)
MCH: 26.8 pg (ref 26.0–34.0)
MCHC: 30.7 g/dL (ref 30.0–36.0)
MCV: 87.4 fL (ref 78.0–100.0)
PLATELETS: 235 10*3/uL (ref 150–400)
RBC: 4.21 MIL/uL — ABNORMAL LOW (ref 4.22–5.81)
RDW: 15.5 % (ref 11.5–15.5)
WBC: 9.6 10*3/uL (ref 4.0–10.5)

## 2015-06-28 MED ORDER — BENZONATATE 100 MG PO CAPS
100.0000 mg | ORAL_CAPSULE | Freq: Two times a day (BID) | ORAL | Status: DC | PRN
  Administered 2015-06-28 – 2015-06-29 (×4): 100 mg via ORAL
  Filled 2015-06-28 (×4): qty 1

## 2015-06-28 MED ORDER — POTASSIUM CHLORIDE CRYS ER 20 MEQ PO TBCR
40.0000 meq | EXTENDED_RELEASE_TABLET | ORAL | Status: AC
  Administered 2015-06-28 (×2): 40 meq via ORAL
  Filled 2015-06-28 (×2): qty 2

## 2015-06-28 NOTE — Progress Notes (Signed)
PROGRESS NOTE    Andre [name]  BSJ:[id number] DOB: Jul 15, 1985 DOA: 06/14/2015 PCP: No PCP Per Patient   Outpatient Specialists:    Brief Narrative:  Patient was admitted on 06/14/2015, with complaint of abdominal pain nausea and vomiting, was found to have mild Crohn's flareup. GI was consulted. Patient initiated on steroid. Scheduled Imodium was also added by GI. GI recommended to avoid escalating narcotics. The patient is suicidal and psychiatric recommends inpatient hospitalization.  Awaiting placement.   Assessment & Plan:   Principal Problem:   Crohn's disease (HCC) Active Problems:   Bipolar affective disorder (HCC)   Tobacco dependence   Hypokalemia   LLQ abdominal pain   Suicidal ideations   Depression   Anxiety   1. Crohn's disease (HCC) GI was consulted and recommended to avoid escalating further narcotics. Continue current prednisone; continue imuran. continue Pepcid  continue OxyIR every 4 hours. We will not escalate narcotics since there is no indication for higher dose. -schedule tylenol -heme +: spoke with GI-- nothing else to change or add-- CBC stable  2. Anxiety. Continuing Ativan. Continuing Lamictal. Continue Zoloft. When necessary IV lorazepam  3. Chronic pain management. Continuing Neurontin.  4. Suicidal ideation. After patient learned that he will not get a new prescription for oxycodone he informed that he will throw himself in front of a bus and would like to kill him. Patient already has a prescription available from his recent ER visit which he should be able to refill. With patient's suicidal ideation as well as trying to leave the hospital after making the threat, IVC papers were signed.  Psych recommending inpt treatment -has sitter   5. Hypokalemia -repleted  DVT prophylaxis:  lovenox  Code Status: Full Code   Family Communication: No family  Disposition Plan:  inpt psych- AWAIT PLACEMENT   Consultants:    Psych  GI  Procedures:        Subjective: Eating well Watching Dr. Who  Objective: Filed Vitals:   06/27/15 0538 06/27/15 1400 06/27/15 2234 06/28/15 0450  BP: 115/70 119/69 118/65 100/65  Pulse: 65 101 75 87  Temp: 97.7 F (36.5 C) 97.7 F (36.5 C) 99 F (37.2 C) 98 F (36.7 C)  TempSrc:  Oral Oral Oral  Resp: 18 18 17 18   Height:      Weight:      SpO2: 100% 100% 100% 99%    Intake/Output Summary (Last 24 hours) at 06/28/15 1240 Last data filed at 06/28/15 1208  Gross per 24 hour  Intake   1440 ml  Output    200 ml  Net   1240 ml   Filed Weights   06/14/15 0438 06/14/15 0904  Weight: 68.04 kg (150 lb) 68.04 kg (150 lb)    Examination:  General exam: Appears calm and comfortable  Respiratory system: Clear to auscultation. Respiratory effort normal. Cardiovascular system: S1 & S2 heard, RRR. No JVD, murmurs, rubs, gallops or clicks. No pedal edema. Gastrointestinal system: Abdomen is nondistended, soft and  Minimally tender. No organomegaly or masses felt. Normal bowel sounds heard.    Data Reviewed: I have personally reviewed following labs and imaging studies  CBC:  Recent Labs Lab 06/26/15 0622 06/28/15 0439  WBC 8.8 9.6  HGB 11.2* 11.3*  HCT 36.4* 36.8*  MCV 86.3 87.4  PLT 213 235   Basic Metabolic Panel:  Recent Labs Lab 06/26/15 0622 06/28/15 0439  NA 145 142  K 3.2* 3.1*  CL 107 106  CO2 24 26  GLUCOSE  103* 118*  BUN 8 8  CREATININE 0.70 0.77  CALCIUM 8.8* 8.9   GFR: Estimated Creatinine Clearance: 116.7 mL/min (by C-G formula based on Cr of 0.77). Liver Function Tests: No results for input(s): AST, ALT, ALKPHOS, BILITOT, PROT, ALBUMIN in the last 168 hours. No results for input(s): LIPASE, AMYLASE in the last 168 hours. No results for input(s): AMMONIA in the last 168 hours. Coagulation Profile: No results for input(s): INR, PROTIME in the last 168 hours. Cardiac Enzymes: No results for input(s): CKTOTAL, CKMB,  CKMBINDEX, TROPONINI in the last 168 hours. BNP (last 3 results) No results for input(s): PROBNP in the last 8760 hours. HbA1C: No results for input(s): HGBA1C in the last 72 hours. CBG: No results for input(s): GLUCAP in the last 168 hours. Lipid Profile: No results for input(s): CHOL, HDL, LDLCALC, TRIG, CHOLHDL, LDLDIRECT in the last 72 hours. Thyroid Function Tests: No results for input(s): TSH, T4TOTAL, FREET4, T3FREE, THYROIDAB in the last 72 hours. Anemia Panel: No results for input(s): VITAMINB12, FOLATE, FERRITIN, TIBC, IRON, RETICCTPCT in the last 72 hours. Urine analysis:    Component Value Date/Time   COLORURINE YELLOW 05/26/2015 2130   APPEARANCEUR CLEAR 05/26/2015 2130   LABSPEC 1.025 05/26/2015 2130   PHURINE 6.5 05/26/2015 2130   GLUCOSEU NEGATIVE 05/26/2015 2130   HGBUR NEGATIVE 05/26/2015 2130   BILIRUBINUR NEGATIVE 05/26/2015 2130   KETONESUR NEGATIVE 05/26/2015 2130   PROTEINUR NEGATIVE 05/26/2015 2130   UROBILINOGEN 0.2 11/15/2014 0905   NITRITE NEGATIVE 05/26/2015 2130   LEUKOCYTESUR NEGATIVE 05/26/2015 2130    No results found for this or any previous visit (from the past 240 hour(s)).    Anti-infectives    None       Radiology Studies: No results found.      Scheduled Meds: . acetaminophen  650 mg Oral Q6H  . azaTHIOprine  175 mg Oral Daily  . famotidine  20 mg Oral BID  . gabapentin  800 mg Oral TID  . heparin subcutaneous  5,000 Units Subcutaneous Q8H  . hydrocortisone  25 mg Rectal BID  . lamoTRIgine  25 mg Oral BID  . nicotine  21 mg Transdermal Daily  . ondansetron  4 mg Oral Q6H  . potassium chloride  40 mEq Oral Q4H  . prazosin  1 mg Oral QHS  . predniSONE  40 mg Oral Q breakfast  . sertraline  50 mg Oral Daily   Continuous Infusions:    LOS: 13 days    Time spent: 15 min    Elowyn Raupp [name], DO [facility] Pager [phone]  If 7PM-7AM, please contact night-coverage [email or web address] Password [email or web address] 06/28/2015,  12:40 PM

## 2015-06-28 NOTE — Progress Notes (Signed)
Clinical Social Work  CSW received a call from RN reporting that IVC would expire today. CSW spoke with MD who reports she would like pt to be re-evaluated by psych MD to determine if he still needs placement and/or IVC at this time. MD to call [facility] for psych evaluation. CSW updated RN that IVC will not be renewed at this time.  Unk Lightning, LCSW Weekend Coverage

## 2015-06-29 DIAGNOSIS — R45851 Suicidal ideations: Secondary | ICD-10-CM | POA: Diagnosis not present

## 2015-06-29 DIAGNOSIS — K50019 Crohn's disease of small intestine with unspecified complications: Secondary | ICD-10-CM | POA: Diagnosis not present

## 2015-06-29 DIAGNOSIS — E876 Hypokalemia: Secondary | ICD-10-CM | POA: Diagnosis not present

## 2015-06-29 DIAGNOSIS — F314 Bipolar disorder, current episode depressed, severe, without psychotic features: Secondary | ICD-10-CM | POA: Diagnosis not present

## 2015-06-29 MED ORDER — POTASSIUM CHLORIDE CRYS ER 20 MEQ PO TBCR
40.0000 meq | EXTENDED_RELEASE_TABLET | Freq: Once | ORAL | Status: AC
  Administered 2015-06-29: 40 meq via ORAL
  Filled 2015-06-29: qty 2

## 2015-06-29 MED ORDER — LOPERAMIDE HCL 2 MG PO CAPS
2.0000 mg | ORAL_CAPSULE | ORAL | Status: DC | PRN
  Administered 2015-06-29: 2 mg via ORAL
  Filled 2015-06-29: qty 1

## 2015-06-29 NOTE — Clinical Social Work Note (Signed)
CSW received referral for inpatient psychiatric hospitalization however psychiatry has seen patient and is not recommending psychiatric hospitalization anymore.  Case discussed with case manager, and plan is to discharge home.  CSW to sign off please re-consult if social work needs arise.  Ervin Knack. Merla Sawka, MSW, [employer] [phone]

## 2015-06-29 NOTE — Progress Notes (Signed)
PROGRESS NOTE    Andre [name]  WUJ:[id number] DOB: 1986/01/27 DOA: 06/14/2015 PCP: No PCP Per Patient   Outpatient Specialists:    Brief Narrative:  Patient was admitted on 06/14/2015, with complaint of abdominal pain nausea and vomiting, was found to have mild Crohn's flareup. GI was consulted. Patient initiated on steroid. Scheduled Imodium was also added by GI. GI recommended to avoid escalating narcotics. The patient threatened suicide and psychiatric recommends inpatient hospitalization.  Awaiting placement.   Assessment & Plan:   Principal Problem:   Crohn's disease (HCC) Active Problems:   Bipolar affective disorder (HCC)   Tobacco dependence   Hypokalemia   LLQ abdominal pain   Suicidal ideations   Depression   Anxiety   1. Crohn's disease (HCC) GI was consulted and recommended to avoid escalating further narcotics. Continue current prednisone; continue imuran. continue Pepcid  continue OxyIR every 4 hours. We will not escalate narcotics since there is no indication for higher dose. -schedule tylenol -heme +: spoke with GI-- nothing else to change or add-- CBC stable  2. Anxiety. Continuing Ativan. Continuing Lamictal. Continue Zoloft. When necessary IV lorazepam  3. Chronic pain management. Continuing Neurontin.  4. Suicidal ideation. After patient learned that he will not get a new prescription for oxycodone he informed that he will throw himself in front of a bus and would like to kill him. Patient already has a prescription available from his recent ER visit which he should be able to refill. With patient's suicidal ideation as well as trying to leave the hospital after making the threat, IVC papers were signed.  Psych recommending inpt treatment- asked psych to re-eval before IVC papers done again -has sitter   5. Hypokalemia -repleated Bmp in AM  DVT prophylaxis:  lovenox  Code Status: Full Code   Family Communication: No  family  Disposition Plan:  inpt psych- AWAIT PLACEMENT   Consultants:   Psych  GI  Procedures:        Subjective: No complaints Watching TV No further blood in stool   Objective: Filed Vitals:   06/28/15 0450 06/28/15 1300 06/28/15 2048 06/29/15 0453  BP: 100/65 122/77 121/79 103/62  Pulse: 87 107 89 78  Temp: 98 F (36.7 C) 98.8 F (37.1 C) 98.8 F (37.1 C) 97.9 F (36.6 C)  TempSrc: Oral Oral Oral Oral  Resp: Height:      Weight:      SpO2: 99% 96% 96% 94%    Intake/Output Summary (Last 24 hours) at 06/29/15 0929 Last data filed at 06/29/15 0846  Gross per 24 hour  Intake   1440 ml  Output      0 ml  Net   1440 ml   Filed Weights   06/14/15 0438 06/14/15 0904  Weight: 68.04 kg (150 lb) 68.04 kg (150 lb)    Examination:  General exam: Appears calm and comfortable  Respiratory system: Clear to auscultation. Respiratory effort normal. Cardiovascular system: S1 & S2 heard, RRR. No JVD, murmurs, rubs, gallops or clicks. No pedal edema. Gastrointestinal system: Abdomen is nondistended, soft and  Minimally tender. No organomegaly or masses felt. Normal bowel sounds heard.    Data Reviewed: I have personally reviewed following labs and imaging studies  CBC:  Recent Labs Lab 06/26/15 0622 06/28/15 0439  WBC 8.8 9.6  HGB 11.2* 11.3*  HCT 36.4* 36.8*  MCV 86.3 87.4  PLT 213 235   Basic Metabolic Panel:  Recent Labs Lab 06/26/15 0622 06/28/15  0439  NA 145 142  K 3.2* 3.1*  CL 107 106  CO2 24 26  GLUCOSE 103* 118*  BUN 8 8  CREATININE 0.70 0.77  CALCIUM 8.8* 8.9   GFR: Estimated Creatinine Clearance: 116.7 mL/min (by C-G formula based on Cr of 0.77). Liver Function Tests: No results for input(s): AST, ALT, ALKPHOS, BILITOT, PROT, ALBUMIN in the last 168 hours. No results for input(s): LIPASE, AMYLASE in the last 168 hours. No results for input(s): AMMONIA in the last 168 hours. Coagulation Profile: No results for  input(s): INR, PROTIME in the last 168 hours. Cardiac Enzymes: No results for input(s): CKTOTAL, CKMB, CKMBINDEX, TROPONINI in the last 168 hours. BNP (last 3 results) No results for input(s): PROBNP in the last 8760 hours. HbA1C: No results for input(s): HGBA1C in the last 72 hours. CBG: No results for input(s): GLUCAP in the last 168 hours. Lipid Profile: No results for input(s): CHOL, HDL, LDLCALC, TRIG, CHOLHDL, LDLDIRECT in the last 72 hours. Thyroid Function Tests: No results for input(s): TSH, T4TOTAL, FREET4, T3FREE, THYROIDAB in the last 72 hours. Anemia Panel: No results for input(s): VITAMINB12, FOLATE, FERRITIN, TIBC, IRON, RETICCTPCT in the last 72 hours. Urine analysis:    Component Value Date/Time   COLORURINE YELLOW 05/26/2015 2130   APPEARANCEUR CLEAR 05/26/2015 2130   LABSPEC 1.025 05/26/2015 2130   PHURINE 6.5 05/26/2015 2130   GLUCOSEU NEGATIVE 05/26/2015 2130   HGBUR NEGATIVE 05/26/2015 2130   BILIRUBINUR NEGATIVE 05/26/2015 2130   KETONESUR NEGATIVE 05/26/2015 2130   PROTEINUR NEGATIVE 05/26/2015 2130   UROBILINOGEN 0.2 11/15/2014 0905   NITRITE NEGATIVE 05/26/2015 2130   LEUKOCYTESUR NEGATIVE 05/26/2015 2130    No results found for this or any previous visit (from the past 240 hour(s)).    Anti-infectives    None       Radiology Studies: No results found.      Scheduled Meds: . acetaminophen  650 mg Oral Q6H  . azaTHIOprine  175 mg Oral Daily  . famotidine  20 mg Oral BID  . gabapentin  800 mg Oral TID  . heparin subcutaneous  5,000 Units Subcutaneous Q8H  . hydrocortisone  25 mg Rectal BID  . lamoTRIgine  25 mg Oral BID  . nicotine  21 mg Transdermal Daily  . ondansetron  4 mg Oral Q6H  . prazosin  1 mg Oral QHS  . predniSONE  40 mg Oral Q breakfast  . sertraline  50 mg Oral Daily   Continuous Infusions:    LOS: 14 days    Time spent: 15 min    Kyian Obst [name], DO [facility] Pager [phone]  If 7PM-7AM,  please contact night-coverage [email or web address] Password [facility] 06/29/2015, 9:29 AM

## 2015-06-29 NOTE — Consult Note (Signed)
[facility] Face-to-Face Psychiatry Consult follow-up  Reason for Consult:  Depression and suicide ideation Referring Physician:  Dr. Posey Pronto Patient Identification: Andre [name] MRN:  [id number] Principal Diagnosis: Crohn's disease [facility]) Diagnosis:   Patient Active Problem List   Diagnosis Date Noted  . Diarrhea [R19.7]   . Crohn's disease ([place]) [K50.90] 06/14/2015  . Depression [F32.9] 06/14/2015  . Anxiety [F41.9] 06/14/2015  . Suicidal ideations [R45.851] 05/31/2015  . Exacerbation of Crohn's disease ([place]) [K50.90] 05/27/2015  . Hypokalemia [E87.6] 05/27/2015  . Crohn's disease of both small and large intestine without complication ([place]) [R00.76]   . LLQ abdominal pain [R10.32]   . 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 Chronic pain disorder [G89.4] 05/26/2015  . Chronic back pain greater than 3 months duration [M54.9, G89.29] 11/17/2014  . Tobacco dependence [F17.200] 11/17/2014  . Abdominal pain [R10.9] 11/05/2014  . Metabolic acidosis, increased anion gap (IAG) [E87.2] 11/05/2014  . Crohn's colitis ([place]) [K50.10] 10/26/2014  . Acute hypokalemia [E87.6] 10/26/2014  . Acute hyperglycemia [R73.09] 10/26/2014  . Refractory nausea and vomiting [R11.2] 10/26/2014  . Dehydration, moderate [E86.0] 10/26/2014  . Microcytic anemia [D50.9] 10/26/2014  . Bipolar affective disorder ([place]) [F31.9] 10/26/2014  . Anxiety disorder [F41.9] 10/26/2014  . Acute Crohn's disease ([place]) [K50.90] 10/26/2014    Total Time spent with patient: 30 minutes  Subjective:   Andre [name] is a 30 y.o. male patient admitted with depression, suicide ideation and on IVC for threatening to leave with suicide ideation.  HPI:  Andre [name] is a 30 y.o. male, seen, chart reviewed and case discussed with the Dr. Posey Pronto for the face-to-face psychiatric consultation and evaluation of increased symptoms of depression and suicidal ideation with the plan of running into traffic to kill himself. Patient reportedly suffering with  bipolar affective disorder and recently increased symptoms of depression. Patient also reported he has been compliant with his medication and has outpatient psychiatric appointment but could not make it secondary to being physically sick. Patient stated when he made a suicide statement yesterday he really mean it and he worried he is going to kill himself and cannot contract outside the hospital. Patient reportedly came to [place] one half month ago from [place] because he could not get along with his mother and aunt's. Patient was recently admitted to [facility] and reportedly did well over. Patient is willing to be admitted to psychiatric floor for crisis stabilization, safety monitoring on medication management for bipolar depression. Patient is also suffering with a longstanding history of ileocolonic Crohn's disease, s/p multiple bowel resections. 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 Past Psychiatric History: Bipolar affective disorder and recent admission to behavioral health Hospital.  Interval history: Patient seen today for psychiatric consultation follow-up as requested by Dr. Eliseo Squires and psychiatric LCSW. Patient has no complaints today and stated he has been feeling much better since he has no more stomach discomfort or pain and his Crohn's symptoms are much more controlled and is able to eat value his meals. Patient also reportedly sleeping well. Patient reportedly less depressed which seems to be normal for him and does not have any intention to harm himself or other. Patient has no evidence of auditory/visual hallucinations, delusions or paranoia. Patient has been compliant with his medication and has plans of following with outpatient medication management at [facility]. Patient contract for safety. Patient does not meet criteria for acute psychiatric hospitalization any longer and will be referred to the outpatient medication management and medically  stable.  Risk to Self: Is patient at risk for suicide?: No Risk to Others:   Prior Inpatient Therapy:   Prior Outpatient Therapy:    Past Medical History:  Past Medical History  Diagnosis Date  . Anxiety   . Bipolar depression ([place])   . Crohn disease ([place]) 1997  . Anemia     Around 2013 required PRBC transfusions. Has received parenteral iron in past. Has taken oral iron in past.  . Crohn's disease [facility])     Past Surgical History  Procedure Laterality Date  . Subtotal colectomy      Has undergone a total of 3 separate bowel resections including terminal ileal resection and the equivalent of subtotal colectomy. Last surgery was in 2010.  Marland Kitchen Flexible sigmoidoscopy N/A 10/29/2014    Procedure: FLEXIBLE SIGMOIDOSCOPY;  Surgeon: Manus Gunning, MD;  Location: [facility];  Service: Gastroenterology;  Laterality: N/A;   Family History:  Family History  Problem Relation Age of Onset  . Hypertension Father    Family Psychiatric  History: Unknown Social History:  History  Alcohol Use No     History  Drug Use No    Social History   Social History  . Marital Status: Single    Spouse Name: N/A  . Number of Children: N/A  . Years of Education: N/A   Social History Main Topics  . Smoking status: Current Every Day Smoker -- 0.50 packs/day for 5 years  . Smokeless tobacco: Never Used  . Alcohol Use: No  . Drug Use: No  . Sexual Activity: Not Currently   Other Topics Concern  . None   Social History Narrative   Disabled   Single   Says he has moved to [place]   06/14/2015      Additional Social History:Reportedly patient lives with a friend in [place].    Allergies:   Allergies  Allergen Reactions  . Lithium Other (See Comments)    Toxicity   . Remicade [Infliximab] Anaphylaxis  . Compazine [Prochlorperazine] Other (See Comments)    agitation  . Ketorolac Other (See Comments)     GI Distress  . Morphine Other (See Comments)     GI Distress  .  Nsaids Other (See Comments)    Gi distress  . Ciprofloxacin Rash  . Haldol [Haloperidol Lactate] Anxiety  . Metronidazole Rash    Labs:  Results for orders placed or performed during the hospital encounter of 06/14/15 (from the past 48 hour(s))  CBC     Status: Abnormal   Collection Time: 06/28/15  4:39 AM  Result Value Ref Range   WBC 9.6 4.0 - 10.5 K/uL   RBC 4.21 (L) 4.22 - 5.81 MIL/uL   Hemoglobin 11.3 (L) 13.0 - 17.0 g/dL   HCT 36.8 (L) 39.0 - 52.0 %   MCV 87.4 78.0 - 100.0 fL   MCH 26.8 26.0 - 34.0 pg   MCHC 30.7 30.0 - 36.0 g/dL   RDW 15.5 11.5 - 15.5 %   Platelets 235 150 - 400 K/uL  Basic metabolic panel     Status: Abnormal   Collection Time: 06/28/15  4:39 AM  Result Value Ref Range   Sodium 142 135 - 145 mmol/L   Potassium 3.1 (L) 3.5 - 5.1 mmol/L   Chloride 106 101 - 111 mmol/L   CO2 26 22 - 32 mmol/L   Glucose, Bld 118 (H) 65 - 99 mg/dL   BUN 8 6 - 20 mg/dL   Creatinine, Ser 0.77  0.61 - 1.24 mg/dL   Calcium 8.9 8.9 - 10.3 mg/dL   GFR calc non Af Amer >60 >60 mL/min   GFR calc Af Amer >60 >60 mL/min    Comment: (NOTE) The eGFR has been calculated using the CKD EPI equation. This calculation has not been validated in all clinical situations. eGFR's persistently <60 mL/min signify possible Chronic Kidney Disease.    Anion gap 10 5 - 15    Current Facility-Administered Medications  Medication Dose Route Frequency Provider Last Rate Last Dose  . acetaminophen (TYLENOL) tablet 650 mg  650 mg Oral Q6H Jessica U Vann, DO   650 mg at 06/29/15 0813  . azaTHIOprine (IMURAN) tablet 175 mg  175 mg Oral Daily Willia Craze, NP   175 mg at 06/29/15 1000  . benzonatate (TESSALON) capsule 100 mg  100 mg Oral BID PRN Geradine Girt, DO   100 mg at 06/29/15 4259  . bismuth subsalicylate (PEPTO BISMOL) 262 MG/15ML suspension 30 mL  30 mL Oral Q4H PRN Lavina Hamman, MD      . famotidine (PEPCID) tablet 20 mg  20 mg Oral BID Lavina Hamman, MD   20 mg at 06/29/15 0813  .  gabapentin (NEURONTIN) capsule 800 mg  800 mg Oral TID Willia Craze, NP   800 mg at 06/29/15 0814  . heparin injection 5,000 Units  5,000 Units Subcutaneous Q8H Samuella Cota, MD   5,000 Units at 06/20/15 2103  . hydrocortisone (ANUSOL-HC) suppository 25 mg  25 mg Rectal BID Laban Emperor Zehr, PA-C   25 mg at 06/29/15 0813  . lamoTRIgine (LAMICTAL) tablet 25 mg  25 mg Oral BID Willia Craze, NP   25 mg at 06/29/15 0813  . loperamide (IMODIUM) capsule 2 mg  2 mg Oral PRN Geradine Girt, DO      . LORazepam (ATIVAN) tablet 0.5 mg  0.5 mg Oral Q4H PRN Lavina Hamman, MD   0.5 mg at 06/29/15 0033  . nicotine (NICODERM CQ - dosed in mg/24 hours) patch 21 mg  21 mg Transdermal Daily Willia Craze, NP   21 mg at 06/29/15 0814  . ondansetron (ZOFRAN-ODT) disintegrating tablet 4 mg  4 mg Oral Q6H Samuella Cota, MD   4 mg at 06/29/15 0813  . oxyCODONE (Oxy IR/ROXICODONE) immediate release tablet 5 mg  5 mg Oral Q4H PRN Geradine Girt, DO   5 mg at 06/29/15 0430  . prazosin (MINIPRESS) capsule 1 mg  1 mg Oral QHS Willia Craze, NP   1 mg at 06/28/15 2032  . predniSONE (DELTASONE) tablet 40 mg  40 mg Oral Q breakfast Samuella Cota, MD   40 mg at 06/29/15 5638  . sertraline (ZOLOFT) tablet 50 mg  50 mg Oral Daily Willia Craze, NP   50 mg at 06/29/15 1000    Musculoskeletal: Strength & Muscle Tone: within normal limits Gait & Station: normal Patient leans: N/A  Psychiatric Specialty Exam: ROS  Blood pressure 103/62, pulse 78, temperature 97.9 F (36.6 C), temperature source Oral, resp. rate 18, height 5' 2"  (1.575 m), weight 68.04 kg (150 lb), SpO2 94 %.Body mass index is 27.43 kg/(m^2).  General Appearance: Casual  Eye Contact::  Good  Speech:  Clear and Coherent  Volume:  Decreased  Mood:  Depressed and Worthless  Affect:  Constricted and Depressed  Thought Process:  Coherent and Goal Directed  Orientation:  Full (Time, Place, and Person)  Thought Content:  Rumination   Suicidal Thoughts:  Yes.  with intent/plan  Homicidal Thoughts:  No  Memory:  Immediate;   Good Recent;   Fair Remote;   Fair  Judgement:  Impaired  Insight:  Fair  Psychomotor Activity:  Decreased  Concentration:  Fair  Recall:  Good  Fund of Knowledge:Good  Language: Good  Akathisia:  Negative  Handed:  Right  AIMS (if indicated):     Assets:  Communication Skills Desire for Improvement Financial Resources/Insurance Housing Leisure Time Resilience Social Support Talents/Skills  ADL's:  Impaired  Cognition: WNL  Sleep:      Treatment Plan Summary: Patient has been improved since last visit. Patient reportedly doing much better both with his medical condition Crohn's and also depression. Patient denies current symptoms of depression, suicidal/homicidal ideation and has no evidence of psychosis. 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 Patient does not meet criteria for psychiatric inpatient admission. Supportive therapy provided about ongoing stressors.  Durward Parcel., MD 06/29/2015 11:24 AM

## 2015-06-29 NOTE — Care Management Note (Signed)
Case Management Note  Patient Details  Name: Andre [name] MRN: [id number] Date of Birth: [birth year]  Subjective/Objective:                    Action/Plan:  Consult for pain management clinic , called [facility] , patient has to have a referral from a PCP to be accepted to clinic not a hospitalist . Explained same to patient , he voiced understanding.  Patient does not have PCP . Explained to patient how to establish PCP , and offered [employer] [facility] information . Patient wishes to go to [facility] until he has established another PCP . Appointment made at [facility] for Friday Jul 03, 2015 at 1100 am . Patient aware and [facility] information given to patient. He voiced understanding. Expected Discharge Date:  06/29/15               Expected Discharge Plan:  Home/Self Care  In-House Referral:  Clinical Social Work  Discharge planning Services  CM Consult, Indigent Health Clinic  Post Acute Care Choice:    Choice offered to:  Patient  DME Arranged:    DME Agency:     HH Arranged:    HH Agency:     Status of Service:  Completed, signed off  Medicare Important Message Given:  Yes Date Medicare IM Given:    Medicare IM give by:    Date Additional Medicare IM Given:    Additional Medicare Important Message give by:     If discussed at Long Length of Stay Meetings, dates discussed:    Additional Comments:  Kingsley Plan, RN 06/29/2015, 3:20 PM

## 2015-06-30 DIAGNOSIS — K5080 Crohn's disease of both small and large intestine without complications: Secondary | ICD-10-CM | POA: Diagnosis not present

## 2015-06-30 DIAGNOSIS — R45851 Suicidal ideations: Secondary | ICD-10-CM | POA: Diagnosis not present

## 2015-06-30 DIAGNOSIS — K5090 Crohn's disease, unspecified, without complications: Secondary | ICD-10-CM | POA: Diagnosis not present

## 2015-06-30 DIAGNOSIS — E876 Hypokalemia: Secondary | ICD-10-CM | POA: Diagnosis not present

## 2015-06-30 DIAGNOSIS — F314 Bipolar disorder, current episode depressed, severe, without psychotic features: Secondary | ICD-10-CM | POA: Diagnosis not present

## 2015-06-30 DIAGNOSIS — K50019 Crohn's disease of small intestine with unspecified complications: Secondary | ICD-10-CM | POA: Diagnosis not present

## 2015-06-30 LAB — BASIC METABOLIC PANEL
ANION GAP: 13 (ref 5–15)
BUN: 16 mg/dL (ref 6–20)
CALCIUM: 9 mg/dL (ref 8.9–10.3)
CO2: 23 mmol/L (ref 22–32)
CREATININE: 0.79 mg/dL (ref 0.61–1.24)
Chloride: 103 mmol/L (ref 101–111)
GLUCOSE: 91 mg/dL (ref 65–99)
Potassium: 3.8 mmol/L (ref 3.5–5.1)
Sodium: 139 mmol/L (ref 135–145)

## 2015-06-30 MED ORDER — OXYCODONE HCL 5 MG PO TABS: 5.0000 mg | ORAL_TABLET | ORAL | Status: DC | PRN

## 2015-06-30 MED ORDER — GABAPENTIN 400 MG PO CAPS: 800.0000 mg | ORAL_CAPSULE | Freq: Three times a day (TID) | ORAL | Status: DC

## 2015-06-30 MED ORDER — SERTRALINE HCL 50 MG PO TABS: 50.0000 mg | ORAL_TABLET | Freq: Every day | ORAL | Status: DC

## 2015-06-30 NOTE — Clinical Social Work Note (Signed)
CSW receive consult for patient needing an appointment scheduled for outpatient follow up at [facility].  CSW contacted [facility] [phone] and they said there is a 2 month wait list before available appointments.  [facility] requested clinical information to be faxed to set up referral.  Ervin Knack. Mariacristina Aday, MSW, LCSWA [phone] 06/30/2015 10:42 AM

## 2015-06-30 NOTE — Discharge Summary (Signed)
Physician Discharge Summary  Andre [name] NUU:[id number] DOB: [birth year] DOA: 06/14/2015  PCP: No PCP Per Patient  Admit date: 06/14/2015 Discharge date: 06/30/2015   Recommendations for Outpatient Follow-Up:   1. WOULD NOT GIVE ANY MORE PAIN MEDICATIONS-- patient has been apprised of our policy for pain medications and he is to establish at pain clinic as he was going to one in [place] before he moved 2. Psych referral to [place]   Discharge Diagnosis:   Principal Problem:   Crohn's disease (HCC) Active Problems:   Bipolar affective disorder (HCC)   Tobacco dependence   Hypokalemia   LLQ abdominal pain   Suicidal ideations   Depression   Anxiety   Discharge disposition:  Home  Discharge Condition: Improved.  Diet recommendation: regular.  Wound care: None.   History of Present Illness:   Andre [name] is a 30 y.o. male with a longstanding history of ileocolonic Crohn's disease, s/p multiple bowel resections. 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 Patient presented to [facility] in Sept 2016 with Crohn's flare symptoms (he was living in [place] at the time), CT scan in ED just prior to admission revealed inflammatory changes near ileocolonic anastomosis. Patient seen by [facility] who treated him with Solumedrol and flagyl and continuation of home Azathiopurine. Flex sigmoidoscopy revealed stricture and ulceration of distal rectum. Biopsies of ileocolonic anastomosis were negative. 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 CMV negative. He improved and was discharged home but returned three days later with recurrent abdominal pain. GI reevaluated. Patient's abdominal pain felt to be multifactorial (neuropathic, narcotic bowel and Crohn's).   Patient returned to [facility] earlier this month with LLQ pain and increased frequency of stools. Stool for C-diff was negative. He was seen again by [facility] and continued on home Prednisone and Imuran. Patient  discharged home where he spent 3 days then was hospitalized at [facility] for severe anxiety and depression with suicide ideation.   Patient tells me that his current left lower quadrant pain is the same as before being admitted to behavioral health. 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 Patient was started on Zoloft and switched from Xanax to Ativan and believes that his abdominal pain is also worse after medication changes. LLQ pain is constant and worse with defecation. Having approx 14 BMs a day unrelated to PO intake. Many stools are nocturnal. He is unable to tolerate PO secondary to nausea. No recent antibiotics. No sick contacts at home. Patient has no blood in his stool. He has no arthralgias. No skin rashes. His anxiety and depression are controlled   Hospital Course by Problem:   1.  Crohn's disease (HCC) GI was consulted and recommended to avoid escalating further narcotics. wean prednisone; continue imuran. continue Pepcid  continue OxyIR every 4 hours for current flare -heme +: spoke with GI-- nothing else to change or add-- CBC stable  2. Anxiety. Continuing Ativan. Continuing Lamictal. Continue Zoloft.  3. Chronic pain management. Continuing Neurontin. Patient to establish in pain clinic  4. Suicidal ideations -has been cleared by psych Outpatient follow up    Medical Consultants:    GI   psych   Discharge Exam:   Filed Vitals:   06/29/15 1258 06/30/15 0506  BP: 115/67 93/55  Pulse: 90 72  Temp: 98.4 F (36.9 C) 97.7 F (36.5 C)  Resp: 18 18   Filed Vitals:   06/28/15  2048 06/29/15 0453 06/29/15 1258 06/30/15 0506  BP: 121/79 103/62 115/67 93/55  Pulse: 89 78 90 72  Temp: 98.8 F (37.1 C) 97.9 F (36.6 C) 98.4 F (36.9 C) 97.7 F (36.5 C)  TempSrc: Oral Oral Oral Oral  Resp: 20 18 18 18   Height:      Weight:      SpO2: 96% 94% 98% 95%      Gen:  NAD    The results of significant diagnostics from this hospitalization (including imaging, microbiology, ancillary and laboratory) are listed below for reference.     Procedures and Diagnostic Studies:   No results found.   Labs:   Basic Metabolic Panel:  Recent Labs Lab 06/26/15 0622 06/28/15 0439 06/30/15 0517  NA 145 142 139  K 3.2* 3.1* 3.8  CL 107 106 103  CO2 24 26 23   GLUCOSE 103* 118* 91  BUN 8 8 16   CREATININE 0.70 0.77 0.79  CALCIUM 8.8* 8.9 9.0   GFR Estimated Creatinine Clearance: 116.7 mL/min (by C-G formula based on Cr of 0.79). Liver Function Tests: No results for input(s): AST, ALT, ALKPHOS, BILITOT, PROT, ALBUMIN in the last 168 hours. No results for input(s): LIPASE, AMYLASE in the last 168 hours. No results for input(s): AMMONIA in the last 168 hours. Coagulation profile No results for input(s): INR, PROTIME in the last 168 hours.  CBC:  Recent Labs Lab 06/26/15 0622 06/28/15 0439  WBC 8.8 9.6  HGB 11.2* 11.3*  HCT 36.4* 36.8*  MCV 86.3 87.4  PLT 213 235   Cardiac Enzymes: No results for input(s): CKTOTAL, CKMB, CKMBINDEX, TROPONINI in the last 168 hours. BNP: Invalid input(s): POCBNP CBG: No results for input(s): GLUCAP in the last 168 hours. D-Dimer No results for input(s): DDIMER in the last 72 hours. Hgb A1c No results for input(s): HGBA1C in the last 72 hours. Lipid Profile No results for input(s): CHOL, HDL, LDLCALC, TRIG, CHOLHDL, LDLDIRECT in the last 72 hours. Thyroid function studies No results for input(s): TSH, T4TOTAL, T3FREE, THYROIDAB in the last 72 hours.  Invalid input(s): FREET3 Anemia work up No results for input(s): VITAMINB12, FOLATE, FERRITIN, TIBC, IRON, RETICCTPCT in the last 72 hours. Microbiology No results found for this or any previous visit (from the past 240 hour(s)).   Discharge Instructions:   Discharge Instructions    Diet general    Complete by:  As directed      Discharge  instructions    Complete by:  As directed   It is important that you read following instructions as well as go over your medication list with RN to help you understand your care after this hospitalization.  Discharge Instructions: Please follow-up with PCP in one week also establish care with pain management clinic. Follow up with GI in 1 month.  Please request your primary care physician to go over all Hospital Tests and Procedure/Radiological results at the follow up,  Please get all Hospital records sent to your PCP by signing hospital release before you go home.   Do not drive, operating heavy machinery, perform activities at heights, swimming or participation in water activities or provide baby sitting services while your are on Pain, Sleep and Anxiety Medications; until you have been seen by Primary Care Physician or a Neurologist and advised to do so again. Do not take more than prescribed Pain, Sleep and Anxiety Medications. You were cared for by a hospitalist during your hospital stay. If you have any questions about your discharge medications  or the care you received while you were in the hospital after you are discharged, you can call the unit and ask to speak with the hospitalist on call if the hospitalist that took care of you is not available.  Once you are discharged, your primary care physician will handle any further medical issues. Please note that NO REFILLS for any discharge medications will be authorized once you are discharged, as it is imperative that you return to your primary care physician (or establish a relationship with a primary care physician if you do not have one) for your aftercare needs so that they can reassess your need for medications and monitor your lab values. You Must read complete instructions/literature along with all the possible adverse reactions/side effects for all the Medicines you take and that have been prescribed to you. Take any new Medicines after  you have completely understood and accept all the possible adverse reactions/side effects. Wear Seat belts while driving. If you have smoked or chewed Tobacco in the last 2 yrs please stop smoking and/or stop any Recreational drug use.     Increase activity slowly    Complete by:  As directed             Medication List    STOP taking these medications        oxyCODONE-acetaminophen 5-325 MG tablet  Commonly known as:  PERCOCET      TAKE these medications        acetaminophen 500 MG tablet  Commonly known as:  TYLENOL  Take 500 mg by mouth every 6 (six) hours as needed (pain).     azaTHIOprine 50 MG tablet  Commonly known as:  IMURAN  Take 3.5 tablets (175 mg total) by mouth daily. Crohn's disease     dicyclomine 10 MG capsule  Commonly known as:  BENTYL  Take 1 capsule (10 mg total) by mouth 3 (three) times daily before meals.     famotidine 20 MG tablet  Commonly known as:  PEPCID  Take 1 tablet (20 mg total) by mouth 2 (two) times daily.     gabapentin 400 MG capsule  Commonly known as:  NEURONTIN  Take 2 capsules (800 mg total) by mouth 3 (three) times daily. For agitation     hydrocortisone 25 MG suppository  Commonly known as:  ANUSOL-HC  Place 1 suppository (25 mg total) rectally 2 (two) times daily.     lamoTRIgine 25 MG tablet  Commonly known as:  LAMICTAL  Take 1 tablet (25 mg total) by mouth 2 (two) times daily. For mood stabilization     loperamide 2 MG capsule  Commonly known as:  IMODIUM  Take 1 capsule (2 mg total) by mouth as needed for diarrhea or loose stools.     LORazepam 0.5 MG tablet  Commonly known as:  ATIVAN  Take 1 tablet (0.5 mg total) by mouth every 12 (twelve) hours as needed for anxiety.     nicotine 21 mg/24hr patch  Commonly known as:  NICODERM CQ - dosed in mg/24 hours  Place 1 patch (21 mg total) onto the skin daily. For nicotine     ondansetron 4 MG disintegrating tablet  Commonly known as:  ZOFRAN ODT  Take 1 tablet (4  mg total) by mouth every 8 (eight) hours as needed for nausea or vomiting.     oxyCODONE 5 MG immediate release tablet  Commonly known as:  Oxy IR/ROXICODONE  Take 1 tablet (5 mg total) by mouth  every 4 (four) hours as needed for severe pain.     prazosin 1 MG capsule  Commonly known as:  MINIPRESS  Take 1 capsule (1 mg total) by mouth at bedtime. For nightmares     predniSONE 10 MG tablet  Commonly known as:  DELTASONE  Take 40mg  daily for 3days,Take 30mg  daily for 3days,Take 20mg  daily until seen by GI.     promethazine 12.5 MG suppository  Commonly known as:  PHENERGAN  Place 1 suppository (12.5 mg total) rectally every 6 (six) hours as needed for nausea or vomiting.     sertraline 50 MG tablet  Commonly known as:  ZOLOFT  Take 1 tablet (50 mg total) by mouth daily. For depression           Follow-up Information    Follow up with Ruffin Frederick, MD On 08/12/2015.   Specialty:  Gastroenterology   Why:  3:30 PM.     Contact information:   [address] [place] [address] [phone]       Schedule an appointment as soon as possible for a visit in 1 week to follow up.   Why:  establish care with pain management clinic.       Schedule an appointment as soon as possible for a visit with [facility].   Why:  Friday Jul 03, 2015 at 1100 follow up appointment    Contact information:   [address] [place] [id number] [phone]       Time coordinating discharge: 35 min  Signed:  Dakia Schifano [name]   [facility] 06/30/2015, 8:32 AM

## 2015-06-30 NOTE — Care Management (Signed)
Referral for :   Refer to psych for medication management at [facility]- per Dr. Shela Commons note  SW consult entered called Alm[facility] Coaster SW phone went to [place] left voice mail.

## 2015-06-30 NOTE — Progress Notes (Signed)
Discharge papers gone over with pt. Prescriptions given to pt. Pt. Had no questions/complaints/concerns. Pt. Had no IV access. Pt. D/c'd home with friend. Pt. Requested no w/c.

## 2015-07-03 ENCOUNTER — Inpatient Hospital Stay: Payer: Self-pay

## 2015-07-07 ENCOUNTER — Telehealth: Payer: Self-pay | Admitting: Gastroenterology

## 2015-07-07 NOTE — Telephone Encounter (Signed)
Caller name: josh  Relation to pt: resident at [facility]     Reason for call:   Pt was seen for Crohn Flare at [facility] hospital - pt is scd for 6/21 with Dr. Adela Lank, but Sharia Reeve would like pt seen sooner since he is on a steroid.  Please contact pt to schedule.

## 2015-07-07 NOTE — Telephone Encounter (Signed)
This patient has not been established with anyone as an outpatient yet. I am happy to see him but no availability, he has been admitted multiple times at [facility] and [facility]. He had been receiving his case in [place] previously. He needs to choose where he wishes to receive long term care. If he wishes to see [place] that is fine, we will figure out a way to get him in the clinic. He should keep the appointment with me in June and maybe see if an APP can see him in the interim? I will look at my schedule to see if there are any other openings.

## 2015-07-07 NOTE — Telephone Encounter (Signed)
Not sure where to schedule this patient. No APP appointments or appointment with Dr. Adela Lank. Please, advise.

## 2015-07-08 NOTE — Telephone Encounter (Signed)
See MD recommendation

## 2015-07-21 ENCOUNTER — Encounter (HOSPITAL_COMMUNITY): Payer: Self-pay | Admitting: Emergency Medicine

## 2015-07-21 ENCOUNTER — Emergency Department (HOSPITAL_COMMUNITY)
Admission: EM | Admit: 2015-07-21 | Discharge: 2015-07-21 | Disposition: A | Discharge status: Home / Self Care | Payer: Medicare Other | Attending: Emergency Medicine | Admitting: Emergency Medicine

## 2015-07-21 ENCOUNTER — Emergency Department (HOSPITAL_COMMUNITY): Payer: Medicare Other

## 2015-07-21 DIAGNOSIS — F17210 Nicotine dependence, cigarettes, uncomplicated: Secondary | ICD-10-CM | POA: Insufficient documentation

## 2015-07-21 DIAGNOSIS — F3130 Bipolar disorder, current episode depressed, mild or moderate severity, unspecified: Secondary | ICD-10-CM | POA: Diagnosis not present

## 2015-07-21 DIAGNOSIS — R109 Unspecified abdominal pain: Secondary | ICD-10-CM

## 2015-07-21 DIAGNOSIS — K50919 Crohn's disease, unspecified, with unspecified complications: Secondary | ICD-10-CM

## 2015-07-21 DIAGNOSIS — Z79899 Other long term (current) drug therapy: Secondary | ICD-10-CM | POA: Diagnosis not present

## 2015-07-21 DIAGNOSIS — R1032 Left lower quadrant pain: Secondary | ICD-10-CM | POA: Diagnosis present

## 2015-07-21 LAB — CBC
HEMATOCRIT: 36.9 % — AB (ref 39.0–52.0)
Hemoglobin: 11.9 g/dL — ABNORMAL LOW (ref 13.0–17.0)
MCH: 27.7 pg (ref 26.0–34.0)
MCHC: 32.2 g/dL (ref 30.0–36.0)
MCV: 86 fL (ref 78.0–100.0)
Platelets: 254 10*3/uL (ref 150–400)
RBC: 4.29 MIL/uL (ref 4.22–5.81)
RDW: 15.1 % (ref 11.5–15.5)
WBC: 9.8 10*3/uL (ref 4.0–10.5)

## 2015-07-21 LAB — URINALYSIS, ROUTINE W REFLEX MICROSCOPIC
Bilirubin Urine: NEGATIVE
GLUCOSE, UA: NEGATIVE mg/dL
HGB URINE DIPSTICK: NEGATIVE
Ketones, ur: NEGATIVE mg/dL
LEUKOCYTES UA: NEGATIVE
Nitrite: NEGATIVE
PH: 6 (ref 5.0–8.0)
Protein, ur: NEGATIVE mg/dL
Specific Gravity, Urine: <1.005 — ABNORMAL LOW (ref 1.005–1.030)

## 2015-07-21 LAB — COMPREHENSIVE METABOLIC PANEL
ALT: 13 U/L — ABNORMAL LOW (ref 17–63)
AST: 15 U/L (ref 15–41)
Albumin: 3.9 g/dL (ref 3.5–5.0)
Alkaline Phosphatase: 62 U/L (ref 38–126)
Anion gap: 12 (ref 5–15)
BUN: 10 mg/dL (ref 6–20)
CHLORIDE: 109 mmol/L (ref 101–111)
CO2: 23 mmol/L (ref 22–32)
Calcium: 9 mg/dL (ref 8.9–10.3)
Creatinine, Ser: 0.79 mg/dL (ref 0.61–1.24)
Glucose, Bld: 99 mg/dL (ref 65–99)
POTASSIUM: 3.2 mmol/L — AB (ref 3.5–5.1)
SODIUM: 144 mmol/L (ref 135–145)
Total Bilirubin: 0.4 mg/dL (ref 0.3–1.2)
Total Protein: 6.9 g/dL (ref 6.5–8.1)

## 2015-07-21 LAB — LIPASE, BLOOD: LIPASE: 28 U/L (ref 11–51)

## 2015-07-21 LAB — C-REACTIVE PROTEIN: CRP: 1 mg/dL — ABNORMAL HIGH (ref <1.0)

## 2015-07-21 LAB — SEDIMENTATION RATE: Sed Rate: 16 mm/hr (ref 0–16)

## 2015-07-21 MED ORDER — HYDROMORPHONE HCL 1 MG/ML IJ SOLN
1.0000 mg | Freq: Once | INTRAMUSCULAR | Status: AC
  Administered 2015-07-21: 1 mg via INTRAVENOUS
  Filled 2015-07-21: qty 1

## 2015-07-21 MED ORDER — PREDNISONE 10 MG (21) PO TBPK: 10.0000 mg | ORAL_TABLET | Freq: Every day | ORAL | Status: DC

## 2015-07-21 MED ORDER — OXYCODONE HCL 5 MG PO TABS
10.0000 mg | ORAL_TABLET | Freq: Once | ORAL | Status: AC
  Administered 2015-07-21: 10 mg via ORAL
  Filled 2015-07-21: qty 2

## 2015-07-21 MED ORDER — ONDANSETRON HCL 4 MG PO TABS: 4.0000 mg | ORAL_TABLET | Freq: Four times a day (QID) | ORAL | Status: DC

## 2015-07-21 MED ORDER — POTASSIUM CHLORIDE CRYS ER 20 MEQ PO TBCR
40.0000 meq | EXTENDED_RELEASE_TABLET | Freq: Two times a day (BID) | ORAL | Status: DC
  Administered 2015-07-21: 40 meq via ORAL
  Filled 2015-07-21: qty 2

## 2015-07-21 MED ORDER — METOCLOPRAMIDE HCL 5 MG/ML IJ SOLN
10.0000 mg | Freq: Once | INTRAMUSCULAR | Status: DC
  Filled 2015-07-21: qty 2

## 2015-07-21 MED ORDER — ONDANSETRON HCL 4 MG/2ML IJ SOLN
INTRAMUSCULAR | Status: AC
  Filled 2015-07-21: qty 2

## 2015-07-21 MED ORDER — ONDANSETRON HCL 4 MG/2ML IJ SOLN
4.0000 mg | Freq: Once | INTRAMUSCULAR | Status: AC
Start: 2015-07-21 — End: 2015-07-21
  Administered 2015-07-21: 4 mg via INTRAVENOUS

## 2015-07-21 MED ORDER — SODIUM CHLORIDE 0.9 % IV BOLUS (SEPSIS)
1000.0000 mL | Freq: Once | INTRAVENOUS | Status: AC
Start: 2015-07-21 — End: 2015-07-21
  Administered 2015-07-21: 1000 mL via INTRAVENOUS

## 2015-07-21 MED ORDER — OXYCODONE-ACETAMINOPHEN 5-325 MG PO TABS: 1.0000 | ORAL_TABLET | Freq: Three times a day (TID) | ORAL | Status: DC | PRN

## 2015-07-21 MED ORDER — LOPERAMIDE HCL 2 MG PO CAPS: 2.0000 mg | ORAL_CAPSULE | Freq: Four times a day (QID) | ORAL | Status: DC | PRN

## 2015-07-21 NOTE — ED Notes (Signed)
Attempted x 2 for iv access without success.  

## 2015-07-21 NOTE — ED Notes (Signed)
Pt verbalized understanding of no driving and to use caution within 4 hours of taking pain meds due to meds cause drowsiness 

## 2015-07-21 NOTE — Discharge Instructions (Signed)
Your GI doctor recommended starting steroid burst. Please take as prescribed until follow-up with him in 2 weeks. Return without fail for worsening symptoms, including vomiting unable to keep down food or fluids, fevers, worsening pain, or any other symptoms concerning to you.  Crohn Disease Crohn disease is a long-lasting (chronic) disease that affects your gastrointestinal (GI) tract. It often causes irritation and swelling (inflammation) in your small intestine and the beginning of your large intestine. However, it can affect any part of your GI tract. Crohn disease is part of a group of illnesses that are known as inflammatory bowel disease (IBD). Crohn disease may start slowly and get worse over time. Symptoms may come and go. They may also disappear for months or even years at a time (remission). CAUSES The exact cause of Crohn disease is not known. It may be a response that causes your body's defense system (immune system) to mistakenly attack healthy cells and tissues (autoimmune response). Your genes and your environment may also play a role. RISK FACTORS You may be at greater risk for Crohn disease if you:  Have other family members with Crohn disease or another IBD.  Use any tobacco products, including cigarettes, chewing tobacco, or electronic cigarettes.  Are in your 71s.  Have [place] European ancestry. SIGNS AND SYMPTOMS The main signs and symptoms of Crohn disease involve your GI tract. These include:  Diarrhea.  Rectal bleeding.  An urgent need to move your bowels.  The feeling that you are not finished having a bowel movement.  Abdominal pain or cramping.  Constipation. General signs and symptoms of Crohn disease may also include:  Unexplained weight loss.  Fatigue.  Fever.  Nausea.  Loss of appetite.  Joint pain  Changes in vision.  Red bumps on your skin. DIAGNOSIS Your health care provider may suspect Crohn disease based on your symptoms and  your medical history. Your health care provider will do a physical exam. You may need to see a health care provider who specializes in diseases of the digestive tract (gastroenterologist). You may also have tests to help your health care providers make a diagnosis. These may include:  Blood tests.  Stool sample tests.  Imaging tests, such as X-rays and CT scans.  Tests to examine the inside of your intestines using a long, flexible tube that has a light and a camera on the end (endoscopy or colonoscopy).  A procedure to take tissue samples from inside your bowel (biopsy) to be examined under a microscope. TREATMENT  There is no cure for Crohn disease. Treatment will focus on managing your symptoms. Crohn disease affects each person differently. Your treatment may include:  Resting your bowels. Drinking only clear liquids or getting nutrition through an IV for a period of time gives your bowels a chance to heal because they are not passing stools.  Medicines. These may be used alone or in combination (combination therapy). These may include antibiotic medicines. You may be given medicines that help to:  Reduce inflammation.  Control your immune system activity.  Fight infections.  Relieve cramps and prevent diarrhea.  Control your pain.  Surgery. You may need surgery if:  Medicines and other treatments are no longer working.  You develop complications from severe Crohn disease.  A section of your intestine becomes so damaged that it needs to be removed. HOME CARE INSTRUCTIONS  Take medicines only as directed by your health care provider.  If you were prescribed an antibiotic medicine, finish it all even if  you start to feel better.  Keep all follow-up visits as directed by your health care provider. This is important.  Talk with your health care provider about changing your diet. This may help your symptoms. Your health care provide may recommend changes, such  as:  Drinking more fluids.  Avoiding milk and other foods that contain lactose.  Eating a low-fat diet.  Avoiding high-fiber foods, such as popcorn and nuts.  Avoiding carbonated beverages, such as soda.  Eating smaller meals more often rather than eating large meals.  Keeping a food diary to identify foods that make your symptoms better or worse.  Do not use any tobacco products, including cigarettes, chewing tobacco, or electronic cigarettes. If you need help quitting, ask your health care provider.  Limit alcohol intake to no more than 1 drink per day for nonpregnant women and 2 drinks per day for men. One drink equals 12 ounces of beer, 5 ounces of wine, or 1 ounces of hard liquor.  Exercise daily or as directed by your health care provider. SEEK MEDICAL CARE IF:  You have diarrhea, abdominal cramps, and other gastrointestinal problems that are present almost all of the time.  Your symptoms do not improve with treatment.  You continue to lose weight.  You develop a rash or sores on your skin.  You develop eye problems.  You have a fever.   Your symptoms get worse.  You develop new symptoms. SEEK IMMEDIATE MEDICAL CARE IF:  You have bloody diarrhea.  You develop severe abdominal pain.  You cannot pass stools.   This information is not intended to replace advice given to you by your health care provider. Make sure you discuss any questions you have with your health care provider.   Document Released: 11/17/2004 Document Revised: 02/28/2014 Document Reviewed: 09/25/2013 Elsevier Interactive Patient Education [employer]

## 2015-07-21 NOTE — ED Provider Notes (Signed)
CSN: [id number]     Arrival date & time 07/21/15  1106 History   First MD Initiated Contact with Patient 07/21/15 1316     Chief Complaint  Patient presents with  . Abdominal Pain     (Consider location/radiation/quality/duration/timing/severity/associated sxs/prior Treatment) HPI 30 year old male who presents with abdominal pain. History of ileocolic crohn's disease s/p subtotal colectomy on chronic steroids and imuran. Follows with Dr. Adela Lank from [facility]. With history of chronic LLQ abdominal pain and frequent admissions in the past for pain control. States 2 days of nonbilious, nonbloody N/V with 12 episodes/day of non-bloody diarrhea. Change from baseline of 5-6 episodes/day of diarrhea. No fever, chills, dysuria, urinary frequency. With worsening LLQ pain, consistent with patient's report of Crohn's flare.  Past Medical History  Diagnosis Date  . Anxiety   . Bipolar depression (HCC)   . Crohn disease (HCC) 1997  . Anemia     Around 2013 required PRBC transfusions. Has received parenteral iron in past. Has taken oral iron in past.  . Crohn's disease [facility])    Past Surgical History  Procedure Laterality Date  . Subtotal colectomy      Has undergone a total of 3 separate bowel resections including terminal ileal resection and the equivalent of subtotal colectomy. Last surgery was in 2010.  Marland Kitchen Flexible sigmoidoscopy N/A 10/29/2014    Procedure: FLEXIBLE SIGMOIDOSCOPY;  Surgeon: Ruffin Frederick, MD;  Location: [facility] ENDOSCOPY;  Service: Gastroenterology;  Laterality: N/A;   Family History  Problem Relation Age of Onset  . Hypertension Father    Social History  Substance Use Topics  . Smoking status: Current Every Day Smoker -- 1.00 packs/day for 5 years    Types: Cigarettes  . Smokeless tobacco: Never Used  . Alcohol Use: No    Review of Systems 10/14 systems reviewed and are negative other than those stated in the HPI    Allergies  Lithium; Remicade; Compazine;  Ketorolac; Morphine; Nsaids; Reglan; Ciprofloxacin; Haldol; and Metronidazole  Home Medications   Prior to Admission medications   Medication Sig Start Date End Date Taking? Authorizing Provider  acetaminophen (TYLENOL) 500 MG tablet Take 500 mg by mouth every 6 (six) hours as needed (pain).   Yes Historical Provider, MD  azaTHIOprine (IMURAN) 50 MG tablet Take 3.5 tablets (175 mg total) by mouth daily. Crohn's disease 06/09/15  Yes Sanjuana Kava, NP  gabapentin (NEURONTIN) 800 MG tablet Take 800 mg by mouth 3 (three) times daily. 07/10/15  Yes Historical Provider, MD  lamoTRIgine (LAMICTAL) 25 MG tablet Take 1 tablet (25 mg total) by mouth 2 (two) times daily. For mood stabilization 06/09/15  Yes Sanjuana Kava, NP  predniSONE (DELTASONE) 10 MG tablet  07/10/15  Yes Historical Provider, MD  sertraline (ZOLOFT) 50 MG tablet Take 1 tablet (50 mg total) by mouth daily. For depression 06/30/15  Yes Joseph Art, DO  traMADol (ULTRAM) 50 MG tablet Take 50 mg by mouth every 6 (six) hours as needed for moderate pain or severe pain.  07/14/15  Yes Historical Provider, MD  dicyclomine (BENTYL) 10 MG capsule Take 1 capsule (10 mg total) by mouth 3 (three) times daily before meals. Patient not taking: Reported on 07/21/2015 06/21/15   Rolly Salter, MD  famotidine (PEPCID) 20 MG tablet Take 1 tablet (20 mg total) by mouth 2 (two) times daily. 06/21/15   Rolly Salter, MD  gabapentin (NEURONTIN) 400 MG capsule Take 2 capsules (800 mg total) by mouth 3 (three)  times daily. For agitation Patient not taking: Reported on 07/21/2015 06/30/15   Joseph Art, DO  hydrocortisone (ANUSOL-HC) 25 MG suppository Place 1 suppository (25 mg total) rectally 2 (two) times daily. 06/21/15   Rolly Salter, MD  loperamide (IMODIUM) 2 MG capsule Take 1 capsule (2 mg total) by mouth as needed for diarrhea or loose stools. 06/21/15   Rolly Salter, MD  loperamide (IMODIUM) 2 MG capsule Take 1 capsule (2 mg total) by mouth 4 (four) times  daily as needed for diarrhea or loose stools. 07/21/15   Lavera Guise, MD  LORazepam (ATIVAN) 0.5 MG tablet Take 1 tablet (0.5 mg total) by mouth every 12 (twelve) hours as needed for anxiety. 06/09/15   Sanjuana Kava, NP  nicotine (NICODERM CQ - DOSED IN MG/24 HOURS) 21 mg/24hr patch Place 1 patch (21 mg total) onto the skin daily. For nicotine 06/09/15   Sanjuana Kava, NP  ondansetron (ZOFRAN ODT) 4 MG disintegrating tablet Take 1 tablet (4 mg total) by mouth every 8 (eight) hours as needed for nausea or vomiting. 06/13/15   Eyvonne Mechanic, PA-C  ondansetron (ZOFRAN) 4 MG tablet Take 1 tablet (4 mg total) by mouth every 6 (six) hours. 07/21/15   Lavera Guise, MD  oxyCODONE (OXY IR/ROXICODONE) 5 MG immediate release tablet Take 1 tablet (5 mg total) by mouth every 4 (four) hours as needed for severe pain. Patient not taking: Reported on 07/21/2015 06/30/15   Joseph Art, DO  oxyCODONE-acetaminophen (PERCOCET/ROXICET) 5-325 MG tablet Take 1 tablet by mouth every 8 (eight) hours as needed for moderate pain or severe pain. 07/21/15   Lavera Guise, MD  prazosin (MINIPRESS) 1 MG capsule Take 1 capsule (1 mg total) by mouth at bedtime. For nightmares 06/09/15   Sanjuana Kava, NP  predniSONE (DELTASONE) 10 MG tablet Take 40mg  daily for 3days,Take 30mg  daily for 3days,Take 20mg  daily until seen by GI. 06/21/15   Rolly Salter, MD  predniSONE (STERAPRED UNI-PAK 21 TAB) 10 MG (21) TBPK tablet Take 1 tablet (10 mg total) by mouth daily. Take 4 tabs by mouth daily  for 2 weeks, days, then 3 tablets daily for 1 week, then 2 tablets for one week, then 1 tablet daily until being seen by your GI doctor 07/21/15   Lavera Guise, MD  promethazine (PHENERGAN) 12.5 MG suppository Place 1 suppository (12.5 mg total) rectally every 6 (six) hours as needed for nausea or vomiting. 06/13/15   Eyvonne Mechanic, PA-C   BP 147/79 mmHg  Pulse 49  Temp(Src) 98.5 F (36.9 C) (Oral)  Resp 18  Ht 5\' 2"  (1.575 m)  Wt 160 lb (72.576 kg)  BMI  29.26 kg/m2  SpO2 100% Physical Exam Physical Exam  Nursing note and vitals reviewed. Constitutional: Well developed, well nourished, non-toxic, and in no acute distress Head: Normocephalic and atraumatic.  Mouth/Throat: Oropharynx is clear and moist.  Neck: Normal range of motion. Neck supple.  Cardiovascular: Normal rate and regular rhythm.   Pulmonary/Chest: Effort normal and breath sounds normal.  Abdominal: Soft. There is LLQ tenderness. There is no rebound and no guarding. No tenderness at McBurney's point. Negative Murphy's sign. Musculoskeletal: Normal range of motion.  Neurological: Alert, no facial droop, fluent speech, moves all extremities symmetrically Skin: Skin is warm and dry.  Psychiatric: Cooperative  ED Course  Procedures (including critical care time) Labs Review Labs Reviewed  COMPREHENSIVE METABOLIC PANEL - Abnormal; Notable for the following:  Potassium 3.2 (*)    ALT 13 (*)    All other components within normal limits  CBC - Abnormal; Notable for the following:    Hemoglobin 11.9 (*)    HCT 36.9 (*)    All other components within normal limits  URINALYSIS, ROUTINE W REFLEX MICROSCOPIC (NOT AT [facility]) - Abnormal; Notable for the following:    Specific Gravity, Urine <1.005 (*)    All other components within normal limits  C-REACTIVE PROTEIN - Abnormal; Notable for the following:    CRP 1.0 (*)    All other components within normal limits  LIPASE, BLOOD  SEDIMENTATION RATE    Imaging Review Dg Abd 2 Views  07/21/2015  CLINICAL DATA:  Abdominal pain.  History of Crohn's disease EXAM: ABDOMEN - 2 VIEW COMPARISON:  June 20, 2015 FINDINGS: Supine and upright images obtained. There is localized mild bowel dilatation in the right mid to lower abdomen. There are no appreciable air-fluid levels. No free air. There are surgical clips in the left upper quadrant. Spleen appears borderline prominent. No demonstrable sacroiliitis. Lung bases are clear. IMPRESSION:  Mild bowel gas prominence focally in the right abdomen without air-fluid level or free air. This appearance is somewhat nonspecific but could be indicative of localized ileus secondary to the Crohn's disease. There is postoperative change in the left upper quadrant. Spleen appears mildly prominent. Electronically Signed   By: Bretta Bang III M.D.   On: 07/21/2015 14:42   I have personally reviewed and evaluated these images and lab results as part of my medical decision-making.   EKG Interpretation None      MDM   Final diagnoses:  Abdominal pain  Crohn's disease with complication, unspecified gastrointestinal tract location [facility])   In short, this is a 30 year old male with history of Crohn's disease who presents with left lower quadrant pain, nausea, vomiting, and diarrhea. Vital signs stable on presentation. He is nontoxic and in no acute distress. With focal left lower quadrant tenderness to palpation. Blood work without significant elevation of inflammatory markers and no leukocytosis. Mild hypokalemia of 3.2 and given repletion orally. No other major electrolyte or metabolic derangements. I discussed with Dr. Adela Lank who is patient's GI doctor. Acknowledged that patient has had some mild ileocolitis consistent with his Crohn's disease as of September 2016, but he certainly does have some pain seeking qualities as his pain is always out of proportion to the inflammation that is seen on his endoscopies. Dr. Adela Lank as recommended starting steroid burst, 40 mg of prednisone daily for 2 weeks, with taper down to 5 mg once weekly until he reaches 10 mg daily. He has follow-up with Dr. Adela Lank in 2 weeks. Recommending C. difficile testing,  the patient has not been able to produce any further diarrhea or stool in the ED. patient strongly encouraged to follow up closely with Dr. Adela Lank. He feels improved after a dose of Zofran, IV fluids, and 1 mg of Dilaudid. At this time, he is felt  stable for discharge home as his nausea significantly improved, and he is able to tolerate by mouth intake. Given near normal inflammatory markers, Dr. Adela Lank did not feel that repeat endoscopy, CT scan, or inpt treatment. Strict return and follow-up instructions reviewed. He expressed understanding of all discharge instructions and felt comfortable with the plan of care.   Lavera Guise, MD 07/21/15 [phone]

## 2015-07-21 NOTE — ED Notes (Signed)
Pt c/o n/v/d and abdominal pain x 2 days. Pt hx of Crohn's disease and states, "I think this is a flare up." Denies urinary symptoms.

## 2015-07-22 ENCOUNTER — Emergency Department (HOSPITAL_COMMUNITY): Payer: Medicare Other

## 2015-07-22 ENCOUNTER — Emergency Department (HOSPITAL_COMMUNITY)
Admission: EM | Admit: 2015-07-22 | Discharge: 2015-07-23 | Disposition: A | Discharge status: Other Acute Inpatient Hospital | Payer: Medicare Other | Attending: Emergency Medicine | Admitting: Emergency Medicine

## 2015-07-22 ENCOUNTER — Encounter (HOSPITAL_COMMUNITY): Payer: Self-pay | Admitting: *Deleted

## 2015-07-22 DIAGNOSIS — F17210 Nicotine dependence, cigarettes, uncomplicated: Secondary | ICD-10-CM | POA: Diagnosis not present

## 2015-07-22 DIAGNOSIS — F319 Bipolar disorder, unspecified: Secondary | ICD-10-CM | POA: Insufficient documentation

## 2015-07-22 DIAGNOSIS — K5090 Crohn's disease, unspecified, without complications: Secondary | ICD-10-CM | POA: Diagnosis not present

## 2015-07-22 DIAGNOSIS — Z79899 Other long term (current) drug therapy: Secondary | ICD-10-CM | POA: Diagnosis not present

## 2015-07-22 DIAGNOSIS — R109 Unspecified abdominal pain: Secondary | ICD-10-CM

## 2015-07-22 DIAGNOSIS — K50919 Crohn's disease, unspecified, with unspecified complications: Secondary | ICD-10-CM

## 2015-07-22 DIAGNOSIS — R112 Nausea with vomiting, unspecified: Secondary | ICD-10-CM | POA: Diagnosis present

## 2015-07-22 DIAGNOSIS — R1032 Left lower quadrant pain: Secondary | ICD-10-CM

## 2015-07-22 LAB — CBC WITH DIFFERENTIAL/PLATELET
BASOS ABS: 0 10*3/uL (ref 0.0–0.1)
Basophils Relative: 0 %
EOS PCT: 0 %
Eosinophils Absolute: 0 10*3/uL (ref 0.0–0.7)
HCT: 33.7 % — ABNORMAL LOW (ref 39.0–52.0)
Hemoglobin: 11 g/dL — ABNORMAL LOW (ref 13.0–17.0)
LYMPHS PCT: 13 %
Lymphs Abs: 1.1 10*3/uL (ref 0.7–4.0)
MCH: 27.6 pg (ref 26.0–34.0)
MCHC: 32.6 g/dL (ref 30.0–36.0)
MCV: 84.5 fL (ref 78.0–100.0)
MONO ABS: 0.4 10*3/uL (ref 0.1–1.0)
Monocytes Relative: 5 %
Neutro Abs: 6.7 10*3/uL (ref 1.7–7.7)
Neutrophils Relative %: 82 %
PLATELETS: 254 10*3/uL (ref 150–400)
RBC: 3.99 MIL/uL — ABNORMAL LOW (ref 4.22–5.81)
RDW: 14.7 % (ref 11.5–15.5)
WBC: 8.2 10*3/uL (ref 4.0–10.5)

## 2015-07-22 MED ORDER — DIATRIZOATE MEGLUMINE & SODIUM 66-10 % PO SOLN
ORAL | Status: AC
  Filled 2015-07-22: qty 60

## 2015-07-22 MED ORDER — ONDANSETRON HCL 4 MG/2ML IJ SOLN
4.0000 mg | Freq: Once | INTRAMUSCULAR | Status: AC
  Administered 2015-07-23: 4 mg via INTRAVENOUS
  Filled 2015-07-22: qty 2

## 2015-07-22 MED ORDER — HYDROMORPHONE HCL 1 MG/ML IJ SOLN
1.0000 mg | Freq: Once | INTRAMUSCULAR | Status: AC
  Administered 2015-07-23: 1 mg via INTRAVENOUS
  Filled 2015-07-22: qty 1

## 2015-07-22 MED ORDER — SODIUM CHLORIDE 0.9 % IV SOLN: INTRAVENOUS | Status: DC

## 2015-07-22 MED ORDER — SODIUM CHLORIDE 0.9 % IV BOLUS (SEPSIS)
1000.0000 mL | Freq: Once | INTRAVENOUS | Status: AC
  Administered 2015-07-22: 1000 mL via INTRAVENOUS

## 2015-07-22 NOTE — ED Provider Notes (Signed)
CSN: [id number]     Arrival date & time 07/22/15  1902 History  By signing my name below, I, Soijett Blue, attest that this documentation has been prepared under the direction and in the presence of Vanetta Mulders, MD. Electronically Signed: Soijett Blue, ED Scribe. 07/22/2015. 11:01 PM.  Chief Complaint  Patient presents with  . Emesis      The history is provided by the patient. No language interpreter was used.    HPI Comments: Andre [name] is a 30 y.o. male with a medical hx of Chrons dx who presents to the Emergency Department complaining of persistent, non-bloody, emesis onset 3 days. Pt reports that he is on steroids for his chron's and he had his Rx increased yesterday but he has been unable to pick up the prescription. Pt GI specialist is at [facility]. Pt has had 3 total abdominal surgeries with his colon fully removed and his ilium connected to the rectum. He states that he is having associated symptoms of 8-9/10 LLQ abdominal pain, nausea, diarrhea, and low back pain. He states that he has not tried any medications for the relief for his symptoms. He denies blood in stool, fever, CP, SOB, cough, rhinorrhea, sore throat, blood in stool, vision change, HA, bleeding easily, confusion, dysuria, rash, ankle swelling, and any other symptoms.  Per pt chart review: Pt was seen in the ED on 07/21/2015 for abdominal pain. Pt didn't have any imaging completed during his visit. Pt had labs completed during his visit.    Past Medical History  Diagnosis Date  . Anxiety   . Bipolar depression (HCC)   . Crohn disease (HCC) 1997  . Anemia     Around 2013 required PRBC transfusions. Has received parenteral iron in past. Has taken oral iron in past.  . Crohn's disease [facility])    Past Surgical History  Procedure Laterality Date  . Subtotal colectomy      Has undergone a total of 3 separate bowel resections including terminal ileal resection and the equivalent of subtotal colectomy. Last surgery  was in 2010.  Marland Kitchen Flexible sigmoidoscopy N/A 10/29/2014    Procedure: FLEXIBLE SIGMOIDOSCOPY;  Surgeon: Ruffin Frederick, MD;  Location: [facility] ENDOSCOPY;  Service: Gastroenterology;  Laterality: N/A;   Family History  Problem Relation Age of Onset  . Hypertension Father    Social History  Substance Use Topics  . Smoking status: Current Every Day Smoker -- 1.00 packs/day for 5 years    Types: Cigarettes  . Smokeless tobacco: Never Used  . Alcohol Use: No    Review of Systems  Constitutional: Negative for fever.  HENT: Negative for rhinorrhea and sore throat.   Eyes: Negative for visual disturbance.  Respiratory: Negative for cough and shortness of breath.   Cardiovascular: Negative for chest pain.  Gastrointestinal: Positive for nausea, vomiting, abdominal pain and diarrhea. Negative for blood in stool.  Genitourinary: Negative for dysuria.  Musculoskeletal: Positive for back pain (low). Negative for joint swelling.  Skin: Negative for rash.  Neurological: Negative for headaches.  Hematological: Does not bruise/bleed easily.  Psychiatric/Behavioral: Negative for confusion.      Allergies  Lithium; Remicade; Compazine; Ketorolac; Morphine; Nsaids; Reglan; Ciprofloxacin; Haldol; and Metronidazole  Home Medications   Prior to Admission medications   Medication Sig Start Date End Date Taking? Authorizing Provider  azaTHIOprine (IMURAN) 50 MG tablet Take 3.5 tablets (175 mg total) by mouth daily. Crohn's disease 06/09/15  Yes Sanjuana Kava, NP  famotidine (PEPCID) 20 MG tablet Take  1 tablet (20 mg total) by mouth 2 (two) times daily. 06/21/15  Yes Rolly Salter, MD  gabapentin (NEURONTIN) 800 MG tablet Take 800 mg by mouth 3 (three) times daily. 07/10/15  Yes Historical Provider, MD  hydrocortisone (ANUSOL-HC) 25 MG suppository Place 1 suppository (25 mg total) rectally 2 (two) times daily. 06/21/15  Yes Rolly Salter, MD  lamoTRIgine (LAMICTAL) 25 MG tablet Take 1 tablet (25 mg  total) by mouth 2 (two) times daily. For mood stabilization 06/09/15  Yes Sanjuana Kava, NP  LORazepam (ATIVAN) 0.5 MG tablet Take 1 tablet (0.5 mg total) by mouth every 12 (twelve) hours as needed for anxiety. 06/09/15  Yes Sanjuana Kava, NP  ondansetron (ZOFRAN) 4 MG tablet Take 1 tablet (4 mg total) by mouth every 6 (six) hours. 07/21/15  Yes Lavera Guise, MD  oxyCODONE-acetaminophen (PERCOCET/ROXICET) 5-325 MG tablet Take 1 tablet by mouth every 8 (eight) hours as needed for moderate pain or severe pain. 07/21/15  Yes Lavera Guise, MD  prazosin (MINIPRESS) 1 MG capsule Take 1 capsule (1 mg total) by mouth at bedtime. For nightmares 06/09/15  Yes Sanjuana Kava, NP  predniSONE (STERAPRED UNI-PAK 21 TAB) 10 MG (21) TBPK tablet Take 1 tablet (10 mg total) by mouth daily. Take 4 tabs by mouth daily  for 2 weeks, days, then 3 tablets daily for 1 week, then 2 tablets for one week, then 1 tablet daily until being seen by your GI doctor 07/21/15  Yes Lavera Guise, MD  sertraline (ZOLOFT) 50 MG tablet Take 1 tablet (50 mg total) by mouth daily. For depression 06/30/15  Yes Joseph Art, DO  acetaminophen (TYLENOL) 500 MG tablet Take 500 mg by mouth every 6 (six) hours as needed (pain).    Historical Provider, MD  dicyclomine (BENTYL) 10 MG capsule Take 1 capsule (10 mg total) by mouth 3 (three) times daily before meals. Patient not taking: Reported on 07/21/2015 06/21/15   Rolly Salter, MD  gabapentin (NEURONTIN) 400 MG capsule Take 2 capsules (800 mg total) by mouth 3 (three) times daily. For agitation Patient not taking: Reported on 07/21/2015 06/30/15   Joseph Art, DO  loperamide (IMODIUM) 2 MG capsule Take 1 capsule (2 mg total) by mouth as needed for diarrhea or loose stools. Patient not taking: Reported on 07/22/2015 06/21/15   Rolly Salter, MD  loperamide (IMODIUM) 2 MG capsule Take 1 capsule (2 mg total) by mouth 4 (four) times daily as needed for diarrhea or loose stools. 07/21/15   Lavera Guise, MD   nicotine (NICODERM CQ - DOSED IN MG/24 HOURS) 21 mg/24hr patch Place 1 patch (21 mg total) onto the skin daily. For nicotine 06/09/15   Sanjuana Kava, NP  ondansetron (ZOFRAN ODT) 4 MG disintegrating tablet Take 1 tablet (4 mg total) by mouth every 8 (eight) hours as needed for nausea or vomiting. 06/13/15   Eyvonne Mechanic, PA-C  oxyCODONE (OXY IR/ROXICODONE) 5 MG immediate release tablet Take 1 tablet (5 mg total) by mouth every 4 (four) hours as needed for severe pain. Patient not taking: Reported on 07/21/2015 06/30/15   Joseph Art, DO  predniSONE (DELTASONE) 10 MG tablet Take 40mg  daily for 3days,Take 30mg  daily for 3days,Take 20mg  daily until seen by GI. Patient not taking: Reported on 07/22/2015 06/21/15   Rolly Salter, MD  promethazine (PHENERGAN) 12.5 MG suppository Place 1 suppository (12.5 mg total) rectally every 6 (six) hours as needed for  nausea or vomiting. 06/13/15   Eyvonne Mechanic, PA-C  traMADol (ULTRAM) 50 MG tablet Take 50 mg by mouth every 6 (six) hours as needed for moderate pain or severe pain.  07/14/15   Historical Provider, MD   BP 122/81 mmHg  Pulse 60  Temp(Src) 98.9 F (37.2 C) (Oral)  Resp 17  Ht  (1.575 m)  Wt 72.576 kg  BMI 29.26 kg/m2  SpO2 99% Physical Exam  Constitutional: He is oriented to person, place, and time. He appears well-developed and well-nourished.  HENT:  Head: Normocephalic and atraumatic.  Mouth/Throat: Mucous membranes are normal.  Eyes: Conjunctivae and EOM are normal. Pupils are equal, round, and reactive to light. No scleral icterus.  Neck: Neck supple.  Cardiovascular: Normal rate, regular rhythm and normal heart sounds.  Exam reveals no gallop and no friction rub.   No murmur heard. Pulmonary/Chest: Effort normal and breath sounds normal. No respiratory distress. He has no wheezes. He has no rales.  Abdominal: Soft. Bowel sounds are decreased. There is no tenderness. There is no guarding.  Tender to left side with no guarding.    Musculoskeletal:  No swelling to ankles  Neurological: He is alert and oriented to person, place, and time. No cranial nerve deficit. He exhibits normal muscle tone. Coordination normal.  Skin: Skin is warm and dry.  Psychiatric: He has a normal mood and affect. His behavior is normal.  Nursing note and vitals reviewed.   ED Course  Procedures (including critical care time) DIAGNOSTIC STUDIES: Oxygen Saturation is 100% on RA, nl by my interpretation.    COORDINATION OF CARE: 10:59 PM Discussed treatment plan with pt at bedside which includes CT abdomen pelvis, labs, and pt agreed to plan.    Labs Review Labs Reviewed  CBC WITH DIFFERENTIAL/PLATELET - Abnormal; Notable for the following:    RBC 3.99 (*)    Hemoglobin 11.0 (*)    HCT 33.7 (*)    All other components within normal limits  BASIC METABOLIC PANEL - Abnormal; Notable for the following:    Potassium 3.1 (*)    Calcium 8.3 (*)    All other components within normal limits   Results for orders placed or performed during the hospital encounter of 07/22/15  CBC with Differential/Platelet  Result Value Ref Range   WBC 8.2 4.0 - 10.5 K/uL   RBC 3.99 (L) 4.22 - 5.81 MIL/uL   Hemoglobin 11.0 (L) 13.0 - 17.0 g/dL   HCT 01.0 (L) 93.2 - 35.5 %   MCV 84.5 78.0 - 100.0 fL   MCH 27.6 26.0 - 34.0 pg   MCHC 32.6 30.0 - 36.0 g/dL   RDW 73.2 20.2 - 54.2 %   Platelets 254 150 - 400 K/uL   Neutrophils Relative % 82 %   Neutro Abs 6.7 1.7 - 7.7 K/uL   Lymphocytes Relative 13 %   Lymphs Abs 1.1 0.7 - 4.0 K/uL   Monocytes Relative 5 %   Monocytes Absolute 0.4 0.1 - 1.0 K/uL   Eosinophils Relative 0 %   Eosinophils Absolute 0.0 0.0 - 0.7 K/uL   Basophils Relative 0 %   Basophils Absolute 0.0 0.0 - 0.1 K/uL  Basic metabolic panel  Result Value Ref Range   Sodium 142 135 - 145 mmol/L   Potassium 3.1 (L) 3.5 - 5.1 mmol/L   Chloride 106 101 - 111 mmol/L   CO2 28 22 - 32 mmol/L   Glucose, Bld 90 65 - 99 mg/dL   BUN  7 6 - 20  mg/dL   Creatinine, Ser 6.29 0.61 - 1.24 mg/dL   Calcium 8.3 (L) 8.9 - 10.3 mg/dL   GFR calc non Af Amer >60 >60 mL/min   GFR calc Af Amer >60 >60 mL/min   Anion gap 8 5 - 15     Imaging Review Dg Abd 2 Views  07/21/2015  CLINICAL DATA:  Abdominal pain.  History of Crohn's disease EXAM: ABDOMEN - 2 VIEW COMPARISON:  June 20, 2015 FINDINGS: Supine and upright images obtained. There is localized mild bowel dilatation in the right mid to lower abdomen. There are no appreciable air-fluid levels. No free air. There are surgical clips in the left upper quadrant. Spleen appears borderline prominent. No demonstrable sacroiliitis. Lung bases are clear. IMPRESSION: Mild bowel gas prominence focally in the right abdomen without air-fluid level or free air. This appearance is somewhat nonspecific but could be indicative of localized ileus secondary to the Crohn's disease. There is postoperative change in the left upper quadrant. Spleen appears mildly prominent. Electronically Signed   By: Bretta Bang III M.D.   On: 07/21/2015 14:42   I have personally reviewed and evaluated these images and lab results as part of my medical decision-making.   EKG Interpretation None      MDM   Final diagnoses:  Left lower quadrant pain    Patient will be turned over to night physician for disposition. Patient with a history of Crohn's. His had his entire colon already removed. Has ileum attached to rectum. Patient seen yesterday for same. Continue with left-sided abdominal pain continue with nausea and vomiting. Patient will undergo CT scan to rule out any significant complications. Patient's basic electrolytes without significant amount is other than mild hypokalemia with a potassium at 3.1. No leukocytosis. No significant anemia. Patient had full labs yesterday to include a liver function test. If CT scan is negative and patient the is able to control the nausea and vomiting patient can be discharged to  follow-up with [facility].  Complications of Crohn are seen then patient may require admission for bowel rest IV fluid hydration.  I personally performed the services described in this documentation, which was scribed in my presence. The recorded information has been reviewed and is accurate.      Vanetta Mulders, MD 07/23/15 [phone]

## 2015-07-22 NOTE — ED Notes (Signed)
Pt c/o abd pain, n/v, was seen in er yesterday for same, reports that he is not able to keep anything down,

## 2015-07-22 NOTE — ED Notes (Signed)
Pt up to the bathroom, ambulatory, without assistance.
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
## 2015-07-23 ENCOUNTER — Inpatient Hospital Stay (HOSPITAL_COMMUNITY)
Admission: AD | Admit: 2015-07-23 | Discharge: 2015-07-28 | DRG: 885 | Disposition: A | Discharge status: Home / Self Care | Payer: Medicaid Other | Attending: Psychiatry | Admitting: Psychiatry

## 2015-07-23 ENCOUNTER — Encounter (HOSPITAL_COMMUNITY): Payer: Self-pay | Admitting: Behavioral Health

## 2015-07-23 DIAGNOSIS — G4700 Insomnia, unspecified: Secondary | ICD-10-CM | POA: Insufficient documentation

## 2015-07-23 DIAGNOSIS — Z8249 Family history of ischemic heart disease and other diseases of the circulatory system: Secondary | ICD-10-CM

## 2015-07-23 DIAGNOSIS — F410 Panic disorder [episodic paroxysmal anxiety] without agoraphobia: Secondary | ICD-10-CM | POA: Diagnosis present

## 2015-07-23 DIAGNOSIS — F411 Generalized anxiety disorder: Secondary | ICD-10-CM | POA: Diagnosis not present

## 2015-07-23 DIAGNOSIS — F329 Major depressive disorder, single episode, unspecified: Secondary | ICD-10-CM | POA: Diagnosis present

## 2015-07-23 DIAGNOSIS — G8929 Other chronic pain: Secondary | ICD-10-CM | POA: Diagnosis present

## 2015-07-23 DIAGNOSIS — G894 Chronic pain syndrome: Secondary | ICD-10-CM | POA: Diagnosis not present

## 2015-07-23 DIAGNOSIS — R45851 Suicidal ideations: Secondary | ICD-10-CM | POA: Diagnosis present

## 2015-07-23 DIAGNOSIS — K5090 Crohn's disease, unspecified, without complications: Secondary | ICD-10-CM | POA: Diagnosis present

## 2015-07-23 DIAGNOSIS — F319 Bipolar disorder, unspecified: Secondary | ICD-10-CM | POA: Diagnosis present

## 2015-07-23 DIAGNOSIS — F332 Major depressive disorder, recurrent severe without psychotic features: Secondary | ICD-10-CM | POA: Diagnosis not present

## 2015-07-23 DIAGNOSIS — K219 Gastro-esophageal reflux disease without esophagitis: Secondary | ICD-10-CM | POA: Diagnosis present

## 2015-07-23 DIAGNOSIS — F17210 Nicotine dependence, cigarettes, uncomplicated: Secondary | ICD-10-CM | POA: Diagnosis present

## 2015-07-23 LAB — RAPID URINE DRUG SCREEN, HOSP PERFORMED
Amphetamines: NOT DETECTED
BARBITURATES: NOT DETECTED
BENZODIAZEPINES: POSITIVE — AB
Cocaine: NOT DETECTED
Opiates: POSITIVE — AB
Tetrahydrocannabinol: NOT DETECTED

## 2015-07-23 LAB — BASIC METABOLIC PANEL
ANION GAP: 8 (ref 5–15)
BUN: 7 mg/dL (ref 6–20)
CALCIUM: 8.3 mg/dL — AB (ref 8.9–10.3)
CO2: 28 mmol/L (ref 22–32)
CREATININE: 0.77 mg/dL (ref 0.61–1.24)
Chloride: 106 mmol/L (ref 101–111)
Glucose, Bld: 90 mg/dL (ref 65–99)
Potassium: 3.1 mmol/L — ABNORMAL LOW (ref 3.5–5.1)
SODIUM: 142 mmol/L (ref 135–145)

## 2015-07-23 MED ORDER — AZATHIOPRINE 50 MG PO TABS
175.0000 mg | ORAL_TABLET | Freq: Every day | ORAL | Status: DC
  Administered 2015-07-24 – 2015-07-28 (×5): 175 mg via ORAL
  Filled 2015-07-23 (×7): qty 4

## 2015-07-23 MED ORDER — ACETAMINOPHEN 325 MG PO TABS: 650.0000 mg | ORAL_TABLET | Freq: Four times a day (QID) | ORAL | Status: DC | PRN

## 2015-07-23 MED ORDER — PREDNISONE 50 MG PO TABS
50.0000 mg | ORAL_TABLET | Freq: Every day | ORAL | Status: AC
Start: 2015-07-25 — End: 2015-07-25
  Administered 2015-07-25: 50 mg via ORAL
  Filled 2015-07-23: qty 1

## 2015-07-23 MED ORDER — PREDNISONE 10 MG (21) PO TBPK: 10.0000 mg | ORAL_TABLET | Freq: Every day | ORAL | Status: DC

## 2015-07-23 MED ORDER — HYDROMORPHONE HCL 1 MG/ML IJ SOLN
1.0000 mg | Freq: Once | INTRAMUSCULAR | Status: AC
  Administered 2015-07-23: 1 mg via INTRAVENOUS
  Filled 2015-07-23: qty 1

## 2015-07-23 MED ORDER — AZATHIOPRINE 50 MG PO TABS
175.0000 mg | ORAL_TABLET | Freq: Every day | ORAL | Status: DC
  Administered 2015-07-23: 175 mg via ORAL
  Filled 2015-07-23 (×2): qty 4

## 2015-07-23 MED ORDER — PREDNISONE 20 MG PO TABS
20.0000 mg | ORAL_TABLET | Freq: Every day | ORAL | Status: AC
  Administered 2015-07-28: 20 mg via ORAL
  Filled 2015-07-23: qty 1

## 2015-07-23 MED ORDER — PREDNISONE 10 MG PO TABS
10.0000 mg | ORAL_TABLET | Freq: Every day | ORAL | Status: DC
  Filled 2015-07-23: qty 1

## 2015-07-23 MED ORDER — PREDNISONE 10 MG (21) PO TBPK
20.0000 mg | ORAL_TABLET | Freq: Every morning | ORAL | Status: DC
  Filled 2015-07-23: qty 21

## 2015-07-23 MED ORDER — PREDNISONE 10 MG (21) PO TBPK
10.0000 mg | ORAL_TABLET | ORAL | Status: AC
  Administered 2015-07-23: 10 mg via ORAL

## 2015-07-23 MED ORDER — NICOTINE 14 MG/24HR TD PT24
14.0000 mg | MEDICATED_PATCH | Freq: Every day | TRANSDERMAL | Status: DC
  Administered 2015-07-23: 14 mg via TRANSDERMAL
  Filled 2015-07-23: qty 1

## 2015-07-23 MED ORDER — ONDANSETRON HCL 4 MG/2ML IJ SOLN
4.0000 mg | Freq: Once | INTRAMUSCULAR | Status: AC
  Administered 2015-07-23: 4 mg via INTRAVENOUS
  Filled 2015-07-23: qty 2

## 2015-07-23 MED ORDER — FAMOTIDINE 20 MG PO TABS
20.0000 mg | ORAL_TABLET | Freq: Two times a day (BID) | ORAL | Status: DC
  Administered 2015-07-23: 20 mg via ORAL
  Filled 2015-07-23: qty 1

## 2015-07-23 MED ORDER — PREDNISONE 20 MG PO TABS
40.0000 mg | ORAL_TABLET | Freq: Every day | ORAL | Status: AC
  Administered 2015-07-26: 40 mg via ORAL
  Filled 2015-07-23: qty 2

## 2015-07-23 MED ORDER — ONDANSETRON HCL 4 MG PO TABS
4.0000 mg | ORAL_TABLET | Freq: Four times a day (QID) | ORAL | Status: DC
  Administered 2015-07-23 – 2015-07-28 (×15): 4 mg via ORAL
  Filled 2015-07-23 (×28): qty 1

## 2015-07-23 MED ORDER — PREDNISONE 20 MG PO TABS
30.0000 mg | ORAL_TABLET | Freq: Every day | ORAL | Status: AC
  Administered 2015-07-27: 30 mg via ORAL
  Filled 2015-07-23: qty 1

## 2015-07-23 MED ORDER — PREDNISONE 10 MG (21) PO TBPK: 20.0000 mg | ORAL_TABLET | Freq: Every evening | ORAL | Status: DC

## 2015-07-23 MED ORDER — TRAZODONE HCL 50 MG PO TABS
50.0000 mg | ORAL_TABLET | Freq: Every evening | ORAL | Status: DC | PRN
  Administered 2015-07-23 – 2015-07-27 (×3): 50 mg via ORAL
  Filled 2015-07-23 (×4): qty 1

## 2015-07-23 MED ORDER — PREDNISONE 10 MG (21) PO TBPK: 10.0000 mg | ORAL_TABLET | ORAL | Status: DC

## 2015-07-23 MED ORDER — ONDANSETRON HCL 4 MG PO TABS
4.0000 mg | ORAL_TABLET | Freq: Four times a day (QID) | ORAL | Status: DC
  Administered 2015-07-23 (×2): 4 mg via ORAL
  Filled 2015-07-23 (×2): qty 1

## 2015-07-23 MED ORDER — LAMOTRIGINE 25 MG PO TABS
25.0000 mg | ORAL_TABLET | Freq: Two times a day (BID) | ORAL | Status: DC
  Administered 2015-07-23 – 2015-07-28 (×11): 25 mg via ORAL
  Filled 2015-07-23 (×15): qty 1

## 2015-07-23 MED ORDER — ENSURE ENLIVE PO LIQD: 237.0000 mL | Freq: Two times a day (BID) | ORAL | Status: DC

## 2015-07-23 MED ORDER — PREDNISONE 10 MG (21) PO TBPK
20.0000 mg | ORAL_TABLET | Freq: Every evening | ORAL | Status: DC
Start: 2015-07-24 — End: 2015-07-23

## 2015-07-23 MED ORDER — LORAZEPAM 0.5 MG PO TABS
0.5000 mg | ORAL_TABLET | Freq: Two times a day (BID) | ORAL | Status: DC | PRN
  Administered 2015-07-23: 0.5 mg via ORAL
  Filled 2015-07-23: qty 1

## 2015-07-23 MED ORDER — IOPAMIDOL (ISOVUE-300) INJECTION 61%
100.0000 mL | Freq: Once | INTRAVENOUS | Status: AC | PRN
  Administered 2015-07-23: 100 mL via INTRAVENOUS

## 2015-07-23 MED ORDER — SERTRALINE HCL 50 MG PO TABS
50.0000 mg | ORAL_TABLET | Freq: Every day | ORAL | Status: DC
  Administered 2015-07-23: 50 mg via ORAL
  Filled 2015-07-23: qty 1

## 2015-07-23 MED ORDER — MAGNESIUM HYDROXIDE 400 MG/5ML PO SUSP: 30.0000 mL | Freq: Every day | ORAL | Status: DC | PRN

## 2015-07-23 MED ORDER — HYDROXYZINE HCL 25 MG PO TABS
25.0000 mg | ORAL_TABLET | Freq: Four times a day (QID) | ORAL | Status: DC | PRN
  Administered 2015-07-23 – 2015-07-27 (×2): 25 mg via ORAL
  Filled 2015-07-23 (×2): qty 1

## 2015-07-23 MED ORDER — PREDNISONE 20 MG PO TABS
20.0000 mg | ORAL_TABLET | Freq: Once | ORAL | Status: AC
  Administered 2015-07-23: 20 mg via ORAL
  Filled 2015-07-23: qty 1

## 2015-07-23 MED ORDER — GABAPENTIN 400 MG PO CAPS
800.0000 mg | ORAL_CAPSULE | Freq: Three times a day (TID) | ORAL | Status: DC
  Administered 2015-07-23 (×2): 800 mg via ORAL
  Filled 2015-07-23 (×2): qty 2

## 2015-07-23 MED ORDER — PREDNISONE 10 MG (21) PO TBPK: 10.0000 mg | ORAL_TABLET | Freq: Three times a day (TID) | ORAL | Status: DC

## 2015-07-23 MED ORDER — PREDNISONE 20 MG PO TABS
30.0000 mg | ORAL_TABLET | Freq: Once | ORAL | Status: AC
  Administered 2015-07-23: 30 mg via ORAL
  Filled 2015-07-23: qty 2
  Filled 2015-07-23: qty 1

## 2015-07-23 MED ORDER — GABAPENTIN 400 MG PO CAPS
800.0000 mg | ORAL_CAPSULE | Freq: Three times a day (TID) | ORAL | Status: DC
  Administered 2015-07-24 – 2015-07-28 (×15): 800 mg via ORAL
  Filled 2015-07-23 (×19): qty 2

## 2015-07-23 MED ORDER — TRAMADOL HCL 50 MG PO TABS
50.0000 mg | ORAL_TABLET | Freq: Once | ORAL | Status: AC | PRN
  Administered 2015-07-23: 50 mg via ORAL
  Filled 2015-07-23: qty 1

## 2015-07-23 MED ORDER — PREDNISONE 10 MG (21) PO TBPK: 10.0000 mg | ORAL_TABLET | Freq: Four times a day (QID) | ORAL | Status: DC

## 2015-07-23 MED ORDER — NICOTINE 21 MG/24HR TD PT24
21.0000 mg | MEDICATED_PATCH | Freq: Every day | TRANSDERMAL | Status: DC
  Administered 2015-07-24 – 2015-07-28 (×5): 21 mg via TRANSDERMAL
  Filled 2015-07-23 (×7): qty 1

## 2015-07-23 MED ORDER — ACETAMINOPHEN 500 MG PO TABS
500.0000 mg | ORAL_TABLET | Freq: Four times a day (QID) | ORAL | Status: DC | PRN
  Administered 2015-07-23: 500 mg via ORAL
  Filled 2015-07-23: qty 1

## 2015-07-23 MED ORDER — FAMOTIDINE 20 MG PO TABS
20.0000 mg | ORAL_TABLET | Freq: Two times a day (BID) | ORAL | Status: DC
  Administered 2015-07-23 – 2015-07-28 (×11): 20 mg via ORAL
  Filled 2015-07-23 (×16): qty 1

## 2015-07-23 MED ORDER — LAMOTRIGINE 25 MG PO TABS
25.0000 mg | ORAL_TABLET | Freq: Two times a day (BID) | ORAL | Status: DC
  Administered 2015-07-23: 25 mg via ORAL
  Filled 2015-07-23: qty 1

## 2015-07-23 MED ORDER — SERTRALINE HCL 50 MG PO TABS
50.0000 mg | ORAL_TABLET | Freq: Every day | ORAL | Status: DC
  Administered 2015-07-24: 50 mg via ORAL
  Filled 2015-07-23 (×2): qty 1

## 2015-07-23 MED ORDER — PROMETHAZINE HCL 25 MG PO TABS: 25.0000 mg | ORAL_TABLET | Freq: Four times a day (QID) | ORAL | Status: DC | PRN

## 2015-07-23 NOTE — ED Notes (Signed)
Patient requesting pain medication that he takes at home. Informed that RN would advise. MD. Patient stated that he would "walk out if I don't get pain medication". MD notified of patient request and statement. IVC paperwork initiated at MD request and signed.

## 2015-07-23 NOTE — Progress Notes (Signed)
Patient ID: Andre [name], male   DOB: 1985/07/14, 30 y.o.   MRN: [id number] Per State regulations 482.30 this chart was reviewed for medical necessity with respect to the patient's admission/duration of stay.    Next review date: 07/27/15  Thurman Coyer, BSN, RN-BC  Case Manager

## 2015-07-23 NOTE — ED Notes (Signed)
Pt expresses that he is upset b/c he is not being admitted to "hospital and doesn't know what to do" Explained to pt that the phenergan Rx is very effective for nausea and vomiting and that we have treated his "acute pain/nausea r/t crohn's disease" and that he needed to f/u with his gastroenterologist in the morning. Pt argues he doesn't have a ride to get there. Pt expresses anger and says, "I'm just leaving and going to run out in front of a bus!"  Pt walks out of the room and starts down the hall towards the lobby doors with his IV still in place.  I inform pt to stop and come back to room so I can take his IV out or I will Have to call the police.  Pt agrees hesitantly and walks back to room #16 where I take his IV out.  Pt then expresses SI multiple times by saying he "is going to walk out in front of a bus."  He then walks out, down the hall toward lobby for the second time. This nurse immediately notifies Dr Bebe Shaggy and then notify security of situation and inform them to call [employer] and monitor where pt goes. Security informs me that pt is in the bathroom in the ED lobby.  This nurse and security present when pt comes out of bathroom.  Pt never leaves the ED lobby or goes outside.  Pt is escorted to ED room 16 where he is then wanded by security.  All belongings and clothes removed by Jonny Ruiz, NT and placed in locker. [employer] arrive to  [facility] at approx 0330.

## 2015-07-23 NOTE — ED Notes (Signed)
Pt called this nurse to room, inquiring about his "pain medication" and nausea medicine?"  and "when he was going to have his psych consult?"  Informed pt I was not sure when exactly, but we have notified psych and they will perform consult. Informed pt that he has had two doses of dilaudid and zofran, therefore, he has had his nausea and pain medication. Lauren, NT,Sitter just informed this nurse after I left room pt states, " I am about to become very uncooperative". John, NT is now sitter for this pt.

## 2015-07-23 NOTE — ED Provider Notes (Signed)
CT scan shows mild exacerbation of crohns No other acute findings Pt stable for d/c home Advised to call his GI doc today He requests Rx for promethazine He reports he is already on prednisone   Zadie Rhine, MD 07/23/15 [phone]

## 2015-07-23 NOTE — ED Provider Notes (Signed)
At time of discharge, pt was awake/alert, walking around room, talking on phone, no active vomiting, no distress noted Due to multiple ED visits and multiple narcotic prescriptions given previously, I did not feel comfortable prescribing pain meds He then began to ask for ultram, which I declined He then requested phenergan for home use, this was prescribed  After I left the room, he told nurse he was going to jump in front of car due to his pain He left the ED but was escorted back due to concern for harming himself Will consult psych   Zadie Rhine, MD 07/23/15 [phone]

## 2015-07-23 NOTE — Tx Team (Signed)
Initial Interdisciplinary Treatment Plan   PATIENT STRESSORS: Health problems   PATIENT STRENGTHS: Ability for insight Capable of independent living   PROBLEM LIST: Problem List/Patient Goals Date to be addressed Date deferred Reason deferred Estimated date of resolution  "depression" 07/23/15     "pain management" 07/23/15                                                DISCHARGE CRITERIA:  Ability to meet basic life and health needs Adequate post-discharge living arrangements Improved stabilization in mood, thinking, and/or behavior  PRELIMINARY DISCHARGE PLAN: Attend aftercare/continuing care group Attend PHP/IOP  PATIENT/FAMIILY INVOLVEMENT: This treatment plan has been presented to and reviewed with the patient, Andre [name], and/or family member.  The patient and family have been given the opportunity to ask questions and make suggestions.  Andre [name] L 07/23/2015, 6:31 PM

## 2015-07-23 NOTE — Discharge Instructions (Signed)
Crohn Disease Crohn disease is a long-lasting (chronic) disease that affects your gastrointestinal (GI) tract. It often causes irritation and swelling (inflammation) in your small intestine and the beginning of your large intestine. However, it can affect any part of your GI tract. Crohn disease is part of a group of illnesses that are known as inflammatory bowel disease (IBD). Crohn disease may start slowly and get worse over time. Symptoms may come and go. They may also disappear for months or even years at a time (remission). CAUSES The exact cause of Crohn disease is not known. It may be a response that causes your body's defense system (immune system) to mistakenly attack healthy cells and tissues (autoimmune response). Your genes and your environment may also play a role. RISK FACTORS You may be at greater risk for Crohn disease if you:  Have other family members with Crohn disease or another IBD.  Use any tobacco products, including cigarettes, chewing tobacco, or electronic cigarettes.  Are in your 20s.  Have Eastern European ancestry. SIGNS AND SYMPTOMS The main signs and symptoms of Crohn disease involve your GI tract. These include:  Diarrhea.  Rectal bleeding.  An urgent need to move your bowels.  The feeling that you are not finished having a bowel movement.  Abdominal pain or cramping.  Constipation. General signs and symptoms of Crohn disease may also include:  Unexplained weight loss.  Fatigue.  Fever.  Nausea.  Loss of appetite.  Joint pain  Changes in vision.  Red bumps on your skin. DIAGNOSIS Your health care provider may suspect Crohn disease based on your symptoms and your medical history. Your health care provider will do a physical exam. You may need to see a health care provider who specializes in diseases of the digestive tract (gastroenterologist). You may also have tests to help your health care providers make a diagnosis. These may  include:  Blood tests.  Stool sample tests.  Imaging tests, such as X-rays and CT scans.  Tests to examine the inside of your intestines using a long, flexible tube that has a light and a camera on the end (endoscopy or colonoscopy).  A procedure to take tissue samples from inside your bowel (biopsy) to be examined under a microscope. TREATMENT  There is no cure for Crohn disease. Treatment will focus on managing your symptoms. Crohn disease affects each person differently. Your treatment may include:  Resting your bowels. Drinking only clear liquids or getting nutrition through an IV for a period of time gives your bowels a chance to heal because they are not passing stools.  Medicines. These may be used alone or in combination (combination therapy). These may include antibiotic medicines. You may be given medicines that help to:  Reduce inflammation.  Control your immune system activity.  Fight infections.  Relieve cramps and prevent diarrhea.  Control your pain.  Surgery. You may need surgery if:  Medicines and other treatments are no longer working.  You develop complications from severe Crohn disease.  A section of your intestine becomes so damaged that it needs to be removed. HOME CARE INSTRUCTIONS  Take medicines only as directed by your health care provider.  If you were prescribed an antibiotic medicine, finish it all even if you start to feel better.  Keep all follow-up visits as directed by your health care provider. This is important.  Talk with your health care provider about changing your diet. This may help your symptoms. Your health care provide may recommend changes, such   as:  Drinking more fluids.  Avoiding milk and other foods that contain lactose.  Eating a low-fat diet.  Avoiding high-fiber foods, such as popcorn and nuts.  Avoiding carbonated beverages, such as soda.  Eating smaller meals more often rather than eating large  meals.  Keeping a food diary to identify foods that make your symptoms better or worse.  Do not use any tobacco products, including cigarettes, chewing tobacco, or electronic cigarettes. If you need help quitting, ask your health care provider.  Limit alcohol intake to no more than 1 drink per day for nonpregnant women and 2 drinks per day for men. One drink equals 12 ounces of beer, 5 ounces of wine, or 1 ounces of hard liquor.  Exercise daily or as directed by your health care provider. SEEK MEDICAL CARE IF:  You have diarrhea, abdominal cramps, and other gastrointestinal problems that are present almost all of the time.  Your symptoms do not improve with treatment.  You continue to lose weight.  You develop a rash or sores on your skin.  You develop eye problems.  You have a fever.   Your symptoms get worse.  You develop new symptoms. SEEK IMMEDIATE MEDICAL CARE IF:  You have bloody diarrhea.  You develop severe abdominal pain.  You cannot pass stools.   This information is not intended to replace advice given to you by your health care provider. Make sure you discuss any questions you have with your health care provider.   Document Released: 11/17/2004 Document Revised: 02/28/2014 Document Reviewed: 09/25/2013 Elsevier Interactive Patient Education 2016 [employer] Inc[employer]  

## 2015-07-23 NOTE — ED Provider Notes (Signed)
Pt will be Tx to [facility], accepted by [facility].  Eber Hong, MD 07/23/15 1438

## 2015-07-23 NOTE — ED Provider Notes (Signed)
I updated patient on plan to admit after discussion with behavioral health He requested pain meds I advised we would be managing his pain with non-narcotic medications He stood up, tried ripping off wrist band and has attempted to leave Security has been called IVC paperwork has already been completed for patient   Zadie Rhine, MD 07/23/15 [phone]

## 2015-07-23 NOTE — ED Notes (Signed)
Pt moved to room #15 which has been stripped from medical equipment according to hospital policy and procedure. Pt then asks this nurse, " so... I wasn't planning on this happening, so am I going to get my scheduled medications, like my pain and psych med's, because I am bipolar."  Informed pt that after he has consult with TTS and they consult with ED Physician on pt disposition, meaning whether he is a candidate for inpatient treatment or not will depend if the EDP places orders for his scheduled medications.

## 2015-07-23 NOTE — ED Notes (Signed)
MD in to speak with patient regarding pain medication. Patient became irate and walked out of department. Security and [employer] called to secure patient back to room.

## 2015-07-23 NOTE — Progress Notes (Signed)
D    Pt had some complaints of pain due to his chrones disease and requested to take the one time dose of tramadol before going to karaoke group   Pt verbalized knowledge that he knows he will not get another dose until the doctor comes in tomorrow sees him and decides to put him back on his narcotic pain medications    Pt is pleasant mostly but a little irritable   He attended karaoke group and said he sany twice and really enjoyed himself A    Verbal support given   Medications administered and effectiveness monitored    Q 15 min checks    R   Pt safe at present

## 2015-07-23 NOTE — BH Assessment (Signed)
Patient accepted to [facility] 407-2, [facility]. Please call report to [phone].
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
## 2015-07-23 NOTE — ED Notes (Signed)
Patient left with [employer] at 1533 for transport to [facility]. No distress upon departure.

## 2015-07-23 NOTE — ED Notes (Signed)
[employer] at bedside speaking with pt.

## 2015-07-23 NOTE — Progress Notes (Signed)
Verbal orders received from [place], NP., for Tramadol 50 mg, once prn for pain and Vistaril 25 mg q 6 hrs prn for anxiety. System alerted that tramadol is contraindicated with Morphine (pt list allergy)  Writer notified NP. Writer verified allergy with pt and pt stated that he is not allergic to Tramadol or Morphine. Pt also stated that he have made providers aware in the past but it continues to be listed on his chart. Renata Caprice, NP made aware. Orders placed.

## 2015-07-23 NOTE — ED Notes (Signed)
Pt up to bathroom escorted by sitter.

## 2015-07-23 NOTE — ED Notes (Signed)
CT called to inform the IV access to right upper arm infiltrated, therefore CT scan was performed w/o contrast. IV fluids were stopped prior to scan and not started back d/t IV infiltration. IV will be removed.

## 2015-07-23 NOTE — ED Notes (Signed)
MD at bedside. Dr Wickline 
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
## 2015-07-23 NOTE — ED Notes (Signed)
Patient returned to room by security.  

## 2015-07-24 DIAGNOSIS — G894 Chronic pain syndrome: Secondary | ICD-10-CM

## 2015-07-24 DIAGNOSIS — F332 Major depressive disorder, recurrent severe without psychotic features: Secondary | ICD-10-CM

## 2015-07-24 DIAGNOSIS — R45851 Suicidal ideations: Secondary | ICD-10-CM

## 2015-07-24 MED ORDER — OXYCODONE-ACETAMINOPHEN 5-325 MG PO TABS
1.0000 | ORAL_TABLET | Freq: Four times a day (QID) | ORAL | Status: DC | PRN
  Administered 2015-07-24 – 2015-07-28 (×15): 1 via ORAL
  Filled 2015-07-24 (×15): qty 1

## 2015-07-24 MED ORDER — LOPERAMIDE HCL 2 MG PO CAPS
2.0000 mg | ORAL_CAPSULE | Freq: Four times a day (QID) | ORAL | Status: DC
  Administered 2015-07-24 – 2015-07-28 (×18): 2 mg via ORAL
  Filled 2015-07-24 (×25): qty 1

## 2015-07-24 MED ORDER — SERTRALINE HCL 25 MG PO TABS
75.0000 mg | ORAL_TABLET | Freq: Every day | ORAL | Status: DC
  Administered 2015-07-25 – 2015-07-28 (×4): 75 mg via ORAL
  Filled 2015-07-24 (×6): qty 3

## 2015-07-24 MED ORDER — TRAMADOL HCL 50 MG PO TABS
50.0000 mg | ORAL_TABLET | Freq: Two times a day (BID) | ORAL | Status: AC | PRN
  Administered 2015-07-24: 50 mg via ORAL
  Filled 2015-07-24: qty 1

## 2015-07-24 NOTE — H&P (Signed)
Psychiatric Admission Assessment Adult  Patient Identification: Andre [name] MRN:  [id number] Date of Evaluation:  07/24/2015 Chief Complaint:  BIPOLAR DISORDER Principal Diagnosis: MDD (major depressive disorder), recurrent severe, without psychosis ([place]) Diagnosis:   Patient Active Problem List   Diagnosis Date Noted  . MDD (major depressive disorder), recurrent severe, without psychosis ([place]) [F33.2] 07/24/2015  . Diarrhea [R19.7]   . Crohn's disease ([place]) [K50.90] 06/14/2015  . Depression [F32.9] 06/14/2015  . Anxiety [F41.9] 06/14/2015  . Suicidal ideations [R45.851] 05/31/2015  . Exacerbation of Crohn's disease ([place]) [K50.90] 05/27/2015  . Hypokalemia [E87.6] 05/27/2015  . Crohn's disease of both small and large intestine without complication ([place]) [P80.99]   . LLQ abdominal pain [R10.32]   . 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 Chronic pain disorder [G89.4] 05/26/2015  . Chronic back pain greater than 3 months duration [M54.9, G89.29] 11/17/2014  . Tobacco dependence [F17.200] 11/17/2014  . Abdominal pain [R10.9] 11/05/2014  . Metabolic acidosis, increased anion gap (IAG) [E87.2] 11/05/2014  . Crohn's colitis ([place]) [K50.10] 10/26/2014  . Acute hypokalemia [E87.6] 10/26/2014  . Acute hyperglycemia [R73.09] 10/26/2014  . Refractory nausea and vomiting [R11.2] 10/26/2014  . Dehydration, moderate [E86.0] 10/26/2014  . Microcytic anemia [D50.9] 10/26/2014  . Bipolar affective disorder ([place]) [F31.9] 10/26/2014  . Anxiety disorder [F41.9] 10/26/2014  . Acute Crohn's disease ([place]) [K50.90] 10/26/2014   History of Present Illness:  Andre [name], 30 yo came initially to [facility] with abdominal pain.  When he found out that he was being discharged in [place] and without pain meds, he became suicidal.  He verbalized that he felt he would harm himself.  Patient has long hx of Crohns, diagnosed at 30 years of age.  He is quite detailed in his description of current state of his illness.  He's had a 3  colectomies and now only has an ileum attached to his rectum.  He has been tried on several immunosuppressants Methotrexate, Asacol.  He is pending a trial of Entivio once he returns to [place] practice.  [facility] [place] [facility] manages his chronic pain related to Crohns.  When he was seen for aseesment, patient is anxious about his pain regimens.  He feels that life is out of control as his Crohns flare ups almost every monthe per his report.  He states that he recently moved to [place] and has stable place to stay.  Patient recently moved in with a roommate into an apartment.  He was seen today and he was anxious about his prognosis with Crohn's.     Associated Signs/Symptoms: Depression Symptoms:  depressed mood, anxiety, panic attacks, (Hypo) Manic Symptoms:  Irritable Mood, Labiality of Mood, Anxiety Symptoms:  Excessive Worry, Psychotic Symptoms:  NA PTSD Symptoms: NA Total Time spent with patient: 45 minutes  Past Psychiatric History: see HPI  Is the patient at risk to self? Yes.    Has the patient been a risk to self in the past 6 months? Yes.    Has the patient been a risk to self within the distant past? Yes.    Is the patient a risk to others? No.  Has the patient been a risk to others in the past 6 months? No.  Has the patient been a risk to others within the distant past? No.   Prior Inpatient Therapy:   Prior Outpatient Therapy:    Alcohol Screening: 1. How often do you have a drink containing alcohol?: Never 9. Have you or someone else been injured as a result  of your drinking?: No 10. Has a relative or friend or a doctor or another health worker been concerned about your drinking or suggested you cut down?: No Alcohol Use Disorder Identification Test Final Score (AUDIT): 0 Brief Intervention: AUDIT score less than 7 or less-screening does not suggest unhealthy drinking-brief intervention not indicated Substance Abuse History in the last 12 months:  Yes.    Consequences of Substance Abuse: crisis inpatient management Previous Psychotropic Medications: Yes  Psychological Evaluations: Yes  Past Medical History:  Past Medical History  Diagnosis Date  . Anxiety   . Bipolar depression ([place])   . Crohn disease ([place]) 1997  . Anemia     Around 2013 required PRBC transfusions. Has received parenteral iron in past. Has taken oral iron in past.  . Crohn's disease [facility])     Past Surgical History  Procedure Laterality Date  . Subtotal colectomy      Has undergone a total of 3 separate bowel resections including terminal ileal resection and the equivalent of subtotal colectomy. Last surgery was in 2010.  Marland Kitchen Flexible sigmoidoscopy N/A 10/29/2014    Procedure: FLEXIBLE SIGMOIDOSCOPY;  Surgeon: Manus Gunning, MD;  Location: [facility];  Service: Gastroenterology;  Laterality: N/A;   Family History:  Family History  Problem Relation Age of Onset  . Hypertension Father    Family Psychiatric  History:  See HPI Tobacco Screening: _0 [phone])::1)@ Social History:  History  Alcohol Use No     History  Drug Use No    Additional Social History:      Allergies:   Allergies  Allergen Reactions  . Lithium Other (See Comments)    Toxicity   . Remicade [Infliximab] Anaphylaxis  . Compazine [Prochlorperazine] Other (See Comments)    agitation  . Ketorolac Other (See Comments)     GI Distress  . Morphine Other (See Comments)     GI Distress  . Nsaids Other (See Comments)    Gi distress  . Reglan [Metoclopramide] Other (See Comments)    aggrivation  . Ciprofloxacin Rash  . Haldol [Haloperidol Lactate] Anxiety  . Metronidazole Rash   Lab Results:  Results for orders placed or performed during the hospital encounter of 07/22/15 (from the past 48 hour(s))  CBC with Differential/Platelet     Status: Abnormal   Collection Time: 07/22/15 11:45 PM  Result Value Ref Range   WBC 8.2 4.0 - 10.5 K/uL   RBC 3.99 (L) 4.22 - 5.81  MIL/uL   Hemoglobin 11.0 (L) 13.0 - 17.0 g/dL   HCT 33.7 (L) 39.0 - 52.0 %   MCV 84.5 78.0 - 100.0 fL   MCH 27.6 26.0 - 34.0 pg   MCHC 32.6 30.0 - 36.0 g/dL   RDW 14.7 11.5 - 15.5 %   Platelets 254 150 - 400 K/uL   Neutrophils Relative % 82 %   Neutro Abs 6.7 1.7 - 7.7 K/uL   Lymphocytes Relative 13 %   Lymphs Abs 1.1 0.7 - 4.0 K/uL   Monocytes Relative 5 %   Monocytes Absolute 0.4 0.1 - 1.0 K/uL   Eosinophils Relative 0 %   Eosinophils Absolute 0.0 0.0 - 0.7 K/uL   Basophils Relative 0 %   Basophils Absolute 0.0 0.0 - 0.1 K/uL  Basic metabolic panel     Status: Abnormal   Collection Time: 07/22/15 11:45 PM  Result Value Ref Range   Sodium 142 135 - 145 mmol/L   Potassium 3.1 (L) 3.5 - 5.1 mmol/L   Chloride  106 101 - 111 mmol/L   CO2 28 22 - 32 mmol/L   Glucose, Bld 90 65 - 99 mg/dL   BUN 7 6 - 20 mg/dL   Creatinine, Ser 0.77 0.61 - 1.24 mg/dL   Calcium 8.3 (L) 8.9 - 10.3 mg/dL   GFR calc non Af Amer >60 >60 mL/min   GFR calc Af Amer >60 >60 mL/min    Comment: (NOTE) The eGFR has been calculated using the CKD EPI equation. This calculation has not been validated in all clinical situations. eGFR's persistently <60 mL/min signify possible Chronic Kidney Disease.    Anion gap 8 5 - 15  Urine rapid drug screen (hosp performed)     Status: Abnormal   Collection Time: 07/23/15  4:32 AM  Result Value Ref Range   Opiates POSITIVE (A) NONE DETECTED   Cocaine NONE DETECTED NONE DETECTED   Benzodiazepines POSITIVE (A) NONE DETECTED   Amphetamines NONE DETECTED NONE DETECTED   Tetrahydrocannabinol NONE DETECTED NONE DETECTED   Barbiturates NONE DETECTED NONE DETECTED    Comment:        DRUG SCREEN FOR MEDICAL PURPOSES ONLY.  IF CONFIRMATION IS NEEDED FOR ANY PURPOSE, NOTIFY LAB WITHIN 5 DAYS.        LOWEST DETECTABLE LIMITS FOR URINE DRUG SCREEN Drug Class       Cutoff (ng/mL) Amphetamine      1000 Barbiturate      200 Benzodiazepine   710 Tricyclics       626 Opiates           300 Cocaine          300 THC              50     Blood Alcohol level:  Lab Results  Component Value Date   ETH <5 94/85/4627    Metabolic Disorder Labs:  Lab Results  Component Value Date   HGBA1C 5.5 10/27/2014   MPG 111 10/27/2014   No results found for: PROLACTIN No results found for: CHOL, TRIG, HDL, CHOLHDL, VLDL, LDLCALC  Current Medications: Current Facility-Administered Medications  Medication Dose Route Frequency Provider Last Rate Last Dose  . acetaminophen (TYLENOL) tablet 650 mg  650 mg Oral Q6H PRN Kerrie Buffalo, NP      . azaTHIOprine [facility]) tablet 175 mg  175 mg Oral Daily Kerrie Buffalo, NP   175 mg at 07/24/15 0803  . famotidine (PEPCID) tablet 20 mg  20 mg Oral BID Kerrie Buffalo, NP   20 mg at 07/24/15 0804  . feeding supplement (ENSURE ENLIVE) (ENSURE ENLIVE) liquid 237 mL  237 mL Oral BID BM Myer Peer Cobos, MD   237 mL at 07/24/15 1000  . gabapentin (NEURONTIN) capsule 800 mg  800 mg Oral TID Kerrie Buffalo, NP   800 mg at 07/24/15 1208  . hydrOXYzine (ATARAX/VISTARIL) tablet 25 mg  25 mg Oral Q6H PRN Benjamine Mola, FNP   25 mg at 07/23/15 2020  . lamoTRIgine (LAMICTAL) tablet 25 mg  25 mg Oral BID Kerrie Buffalo, NP   25 mg at 07/24/15 0805  . loperamide (IMODIUM) capsule 2 mg  2 mg Oral QID Kerrie Buffalo, NP   2 mg at 07/24/15 1208  . magnesium hydroxide (MILK OF MAGNESIA) suspension 30 mL  30 mL Oral Daily PRN Kerrie Buffalo, NP      . nicotine (NICODERM CQ - dosed in mg/24 hours) patch 21 mg  21 mg Transdermal Daily Jenne Campus, MD  21 mg at 07/24/15 0805  . ondansetron (ZOFRAN) tablet 4 mg  4 mg Oral Q6H Kerrie Buffalo, NP   4 mg at 07/24/15 0804  . oxyCODONE-acetaminophen (PERCOCET/ROXICET) 5-325 MG per tablet 1 tablet  1 tablet Oral Q6H PRN Kerrie Buffalo, NP   1 tablet at 07/24/15 1208  . [START ON 07/25/2015] predniSONE (DELTASONE) tablet 50 mg  50 mg Oral Q breakfast Kerrie Buffalo, NP       Followed by  . [START ON 07/26/2015]  predniSONE (DELTASONE) tablet 40 mg  40 mg Oral Q breakfast Kerrie Buffalo, NP       Followed by  . [START ON 07/27/2015] predniSONE (DELTASONE) tablet 30 mg  30 mg Oral Q breakfast Kerrie Buffalo, NP       Followed by  . [START ON 07/28/2015] predniSONE (DELTASONE) tablet 20 mg  20 mg Oral Q breakfast Kerrie Buffalo, NP       Followed by  . [START ON 07/29/2015] predniSONE (DELTASONE) tablet 10 mg  10 mg Oral Q breakfast Kerrie Buffalo, NP      . Derrill Memo ON 07/25/2015] sertraline (ZOLOFT) tablet 75 mg  75 mg Oral Daily Saramma Eappen, MD      . traMADol (ULTRAM) tablet 50 mg  50 mg Oral Q12H PRN Kerrie Buffalo, NP      . traZODone (DESYREL) tablet 50 mg  50 mg Oral QHS PRN Kerrie Buffalo, NP   50 mg at 07/23/15 2239   PTA Medications: Prescriptions prior to admission  Medication Sig Dispense Refill Last Dose  . acetaminophen (TYLENOL) 500 MG tablet Take 500 mg by mouth every 6 (six) hours as needed (pain).   unknown  . azaTHIOprine (IMURAN) 50 MG tablet Take 3.5 tablets (175 mg total) by mouth daily. Crohn's disease   07/22/2015 at Unknown time  . famotidine (PEPCID) 20 MG tablet Take 1 tablet (20 mg total) by mouth 2 (two) times daily. 40 tablet 0 07/22/2015 at Unknown time  . gabapentin (NEURONTIN) 400 MG capsule Take 2 capsules (800 mg total) by mouth 3 (three) times daily. For agitation 40 capsule 0 07/22/2015  . hydrocortisone (ANUSOL-HC) 25 MG suppository Place 1 suppository (25 mg total) rectally 2 (two) times daily. 12 suppository 0 Past Week at Unknown time  . lamoTRIgine (LAMICTAL) 25 MG tablet Take 1 tablet (25 mg total) by mouth 2 (two) times daily. For mood stabilization 60 tablet 0 07/22/2015  . loperamide (IMODIUM) 2 MG capsule Take 1 capsule (2 mg total) by mouth 4 (four) times daily as needed for diarrhea or loose stools. 12 capsule 0 07/22/2015 at Unknown time  . LORazepam (ATIVAN) 0.5 MG tablet Take 1 tablet (0.5 mg total) by mouth every 12 (twelve) hours as needed for anxiety. 10 tablet 0  Past Week at Unknown time  . nicotine (NICODERM CQ - DOSED IN MG/24 HOURS) 21 mg/24hr patch Place 1 patch (21 mg total) onto the skin daily. For nicotine 28 patch 0 unknown  . ondansetron (ZOFRAN) 4 MG tablet Take 1 tablet (4 mg total) by mouth every 6 (six) hours. 12 tablet 0 07/21/2015  . oxyCODONE-acetaminophen (PERCOCET/ROXICET) 5-325 MG tablet Take 1 tablet by mouth every 8 (eight) hours as needed for moderate pain or severe pain. 12 tablet 0 07/22/2015 at Unknown time  . prazosin (MINIPRESS) 1 MG capsule Take 1 capsule (1 mg total) by mouth at bedtime. For nightmares 30 capsule 0 07/21/2015 at Unknown time  . predniSONE (STERAPRED UNI-PAK 21 TAB) 10 MG (21) TBPK tablet  Take 1 tablet (10 mg total) by mouth daily. Take 4 tabs by mouth daily  for 2 weeks, days, then 3 tablets daily for 1 week, then 2 tablets for one week, then 1 tablet daily until being seen by your GI doctor 100 tablet 0 07/22/2015 at Unknown time  . promethazine (PHENERGAN) 25 MG tablet Take 1 tablet (25 mg total) by mouth every 6 (six) hours as needed for nausea or vomiting. 5 tablet 0 unknown  . sertraline (ZOLOFT) 50 MG tablet Take 1 tablet (50 mg total) by mouth daily. For depression 30 tablet 0 07/22/2015 at Unknown time  . traMADol (ULTRAM) 50 MG tablet Take 50 mg by mouth every 6 (six) hours as needed for moderate pain or severe pain.    unknown  . dicyclomine (BENTYL) 10 MG capsule Take 1 capsule (10 mg total) by mouth 3 (three) times daily before meals. (Patient not taking: Reported on 07/21/2015) 15 capsule 0   . loperamide (IMODIUM) 2 MG capsule Take 1 capsule (2 mg total) by mouth as needed for diarrhea or loose stools. (Patient not taking: Reported on 07/22/2015) 30 capsule 0   . ondansetron (ZOFRAN ODT) 4 MG disintegrating tablet Take 1 tablet (4 mg total) by mouth every 8 (eight) hours as needed for nausea or vomiting. (Patient not taking: Reported on 07/24/2015) 20 tablet 0 Not Taking at Unknown time  . oxyCODONE (OXY  IR/ROXICODONE) 5 MG immediate release tablet Take 1 tablet (5 mg total) by mouth every 4 (four) hours as needed for severe pain. (Patient not taking: Reported on 07/21/2015) 15 tablet 0   . predniSONE (DELTASONE) 10 MG tablet Take 62m daily for 3days,Take 31mdaily for 3days,Take 2075maily until seen by GI. (Patient not taking: Reported on 07/22/2015) 60 tablet 0     Musculoskeletal: Strength & Muscle Tone: within normal limits Gait & Station: normal Patient leans: N/A  Psychiatric Specialty Exam: Physical Exam  Vitals reviewed. Psychiatric: His mood appears anxious. Thought content is not paranoid and not delusional. He exhibits a depressed mood. He expresses no homicidal and no suicidal ideation.    Review of Systems  Gastrointestinal: Positive for nausea and abdominal pain.  All other systems reviewed and are negative.   Blood pressure 124/76, pulse 80, temperature 97.8 F (36.6 C), temperature source Oral, resp. rate 20, height 5' 1" (1.549 m), weight 68.493 kg (151 lb).Body mass index is 28.55 kg/(m^2).  General Appearance: Casual  Eye Contact:  Absent  Speech:  Clear and Coherent  Volume:  Normal  Mood:  Anxious  Affect:  Appropriate  Thought Process:  Coherent and Disorganized  Orientation:  Full (Time, Place, and Person)  Thought Content:  Logical and Rumination  Suicidal Thoughts:  Yes passive, overwhelmed  Homicidal Thoughts:  No  Memory:  Immediate;   Fair Recent;   Fair Remote;   Fair  Judgement:  Fair  Insight:  Fair  Psychomotor Activity:  Normal  Concentration:  Concentration: Fair and Attention Span: Fair  Recall:  [employer] Knowledge:  Fair  Language:  Fair  Akathisia:  Yes  Handed:  Right  AIMS (if indicated):     Assets:  Communication Skills  ADL's:  Intact  Cognition:  WNL  Sleep:  Number of Hours: 6.5    Treatment Plan Summary: Admit for crisis management and mood stabilization. Medication management to re-stabilize current mood  symptoms Group counseling sessions for coping skills Medical consults as needed Review and reinstate any pertinent home medications  for other health problems   Observation Level/Precautions:  15 minute checks  Laboratory:  per ED  Psychotherapy:  group  Medications: Lamictal 25 mg BID mood stability, Percocer 5/325 mg Q6hr PRN pain, Zoloft 75 mg QD depression, Trazodone 50 mg PRN sleep  Consultations:  As needed  Discharge Concerns:  safety  Estimated LOS: 2- 7 days  Other:     I certify that inpatient services furnished can reasonably be expected to improve the patient's condition.    [facility], NP [facility] 6/2/201712:56 PM

## 2015-07-24 NOTE — [facility] Group Notes (Signed)
[facility] LCSW Group Therapy 07/24/2015 1:15pm  Type of Therapy: Group Therapy- Feelings Around Relapse and Recovery  Participation Level: Active   Participation Quality:  Appropriate  Affect:  Appropriate  Cognitive: Alert and Oriented   Insight:  Developing   Engagement in Therapy: Developing/Improving and Engaged   Modes of Intervention: Clarification, Confrontation, Discussion, Education, Exploration, Limit-setting, Orientation, Problem-solving, Rapport Building, Dance movement psychotherapist, Socialization and Support  Summary of Progress/Problems: The topic for today was feelings about relapse. The group discussed what relapse prevention is to them and identified triggers that they are on the path to relapse. Members also processed their feeling towards relapse and were able to relate to common experiences. Group also discussed coping skills that can be used for relapse prevention.  Pt participated actively in discussion and identified continued complications with his Crohn's disease as the trigger for this relapse. Pt discussed the importance of maintaining appropriate and healthy boundaries with family members who may trigger or negatively impact him.   Therapeutic Modalities:   Cognitive Behavioral Therapy Solution-Focused Therapy Assertiveness Training Relapse Prevention Therapy    Andre [name] [phone] 07/24/2015 4:14 PM

## 2015-07-24 NOTE — Progress Notes (Signed)
Adult Psychoeducational Group Note  Date:  07/24/2015 Time:  11:51 PM  Group Topic/Focus:  Wrap-Up Group:   The focus of this group is to help patients review their daily goal of treatment and discuss progress on daily workbooks.  Participation Level:  Active  Participation Quality:  Appropriate  Affect:  Appropriate  Cognitive:  Alert  Insight: Good  Engagement in Group:  Engaged  Modes of Intervention:  Activity  Additional Comments:  Patient rated his day an 8. Goal was to get medications straight. Stated he wanted to go home.  Natasha Mead 07/24/2015, 11:51 PM
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
## 2015-07-24 NOTE — Progress Notes (Signed)
NUTRITION ASSESSMENT  Pt identified as at risk on the Malnutrition Screen Tool  INTERVENTION: 1. Supplements: Continue Ensure Enlive po BID, each supplement provides 350 kcal and 20 grams of protein  NUTRITION DIAGNOSIS: Unintentional weight loss related to sub-optimal intake as evidenced by pt report.   Goal: Pt to meet >/= 90% of their estimated nutrition needs.  Monitor:  PO intake  Assessment:  Pt admitted with depression, and N/V. Pt with history of Crohn's disease. Per weight history, pt has lost 9 lb since 4/22 (6% wt loss x 1 month, significant for time frame). Pt would benefit from nutritional supplementation given weight loss. Ensure has been ordered.   Height: Ht Readings from Last 1 Encounters:  07/23/15 5\' 1"  (1.549 m)    Weight: Wt Readings from Last 1 Encounters:  07/23/15 151 lb (68.493 kg)    Weight Hx: Wt Readings from Last 10 Encounters:  07/23/15 151 lb (68.493 kg)  07/22/15 160 lb (72.576 kg)  07/21/15 160 lb (72.576 kg)  06/14/15 150 lb (68.04 kg)  06/13/15 160 lb (72.576 kg)  05/31/15 153 lb (69.4 kg)  05/31/15 164 lb (74.39 kg)  05/28/15 164 lb (74.39 kg)  05/25/15 250 lb (113.399 kg)  11/17/14 156 lb (70.761 kg)    BMI:  Body mass index is 28.55 kg/(m^2). Pt meets criteria for overweight based on current BMI.  Estimated Nutritional Needs: Kcal: 25-30 kcal/kg Protein: > 1 gram protein/kg Fluid: 1 ml/kcal  Diet Order: Diet regular Room service appropriate?: Yes; Fluid consistency:: Thin Pt is also offered choice of unit snacks mid-morning and mid-afternoon.  Pt is eating as desired.   Lab results and medications reviewed.   Tilda Franco, MS, RD, LDN Pager: [phone] After Hours Pager: [phone]
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
## 2015-07-24 NOTE — Tx Team (Signed)
Interdisciplinary Treatment Plan Update (Adult) Date: 07/24/2015   Date: 07/24/2015 3:54 PM  Progress in Treatment:  Attending groups: Yes  Participating in groups: Yes Taking medication as prescribed: Yes  Tolerating medication: Yes  Family/Significant othe contact made: No, CSW attempting to make contact with roommate Patient understands diagnosis: Yes AEB seeking help with depression Discussing patient identified problems/goals with staff: Yes  Medical problems stabilized or resolved: Yes  Denies suicidal/homicidal ideation: Yes Patient has not harmed self or Others: Yes   New problem(s) identified: None identified at this time.   Discharge Plan or Barriers: Pt will return home and follow-up with outpatient providers; requesting referral to IOP  Additional comments:  Patient and CSW reviewed pt's identified goals and treatment plan. Patient verbalized understanding and agreed to treatment plan. CSW reviewed [facility] "Discharge Process and Patient Involvement" Form. Pt verbalized understanding of information provided and signed form.   Reason for Continuation of Hospitalization:  Anxiety Depression Medication stabilization Suicidal ideation  Estimated length of stay: 3-5 days  Review of initial/current patient goals per problem list:   1.  Goal(s): Patient will participate in aftercare plan  Met:  Yes  Target date: 3-5 days from date of admission   As evidenced by: Patient will participate within aftercare plan AEB aftercare provider and housing plan at discharge being identified.   06/01/15: Pt will return home and follow-up with outpatient providers  2.  Goal (s): Patient will exhibit decreased depressive symptoms and suicidal ideations.  Met:  No  Target date: 3-5 days from date of admission   As evidenced by: Patient will utilize self rating of depression at 3 or below and demonstrate decreased signs of depression or be deemed stable for discharge by MD. 07/24/15: Pt rates  depression at 9/10; denies SI   3.  Goal(s): Patient will demonstrate decreased signs and symptoms of anxiety.  Met:  No  Target date: 3-5 days from date of admission   As evidenced by: Patient will utilize self rating of anxiety at 3 or below and demonstrated decreased signs of anxiety, or be deemed stable for discharge by MD 07/24/15: Pt rates anxiety at 9/10  Attendees:  Patient:    Family:    Physician: Dr. Shea Evans, MD 07/24/2015 3:54 PM  Nursing:  07/24/2015 3:54 PM  Clinical Social Worker Peri Maris, [place] 07/24/2015 3:54 PM  Other: Tilden Fossa, [place] 07/24/2015 3:54 PM  Clinical: Marilynne Halsted, RN; Sandre Kitty, RN 07/24/2015 3:54 PM  Other: , RN Charge Nurse 07/24/2015 3:54 PM  Other:     Peri Maris, [place] Work [phone]

## 2015-07-25 ENCOUNTER — Encounter (HOSPITAL_COMMUNITY): Payer: Self-pay | Admitting: Registered Nurse

## 2015-07-25 DIAGNOSIS — F411 Generalized anxiety disorder: Secondary | ICD-10-CM

## 2015-07-25 DIAGNOSIS — G4700 Insomnia, unspecified: Secondary | ICD-10-CM

## 2015-07-25 NOTE — Progress Notes (Signed)
Patient ID: Andre [name], male   DOB: Mar 21, 1985, 30 y.o.   MRN: [id number] D: Client in dayroom this evening, seen interacting with peers and watching TV. Client reports depression "4" of 10. "admitted to get my medications straightened out" Client reports discharge plans are to return home" A: Writer reviewed medications, administered as ordered. Staff will monitor q24min for safety. R: client is safe on the unit, attended group.

## 2015-07-25 NOTE — Progress Notes (Signed)
[facility] MD Progress Note  07/25/2015 4:29 PM Andre [name]  MRN:  [id number]   Subjective:  "I slept fine last night" Patient seen by this provider and chart reviewed 07/25/15.  On evaluation:  Andre [name] reports that he was just discharged for  [facility] a little over a month ago.  States that he has been attending group sessions but no benefit this time since was recently discharged.  Reports the reason for admission is related to his chron's action up and having to go to the ER for pain.  States when he told the doctor if he has to continue to feel "like this I might as well step in front of a bus."  States that he was suicidal at time related to pain but at this time denies suicidal/homicidal ideation, psychosis, and paranoia.  Reports that compliant with medications and no adverse reaction.  States that he is attending/participating in group sessions.   Principal Problem: MDD (major depressive disorder), recurrent severe, without psychosis (HCC) Diagnosis:   Patient Active Problem List   Diagnosis Date Noted  . MDD (major depressive disorder), recurrent severe, without psychosis (HCC) [F33.2] 07/24/2015  . Diarrhea [R19.7]   . Crohn's disease (HCC) [K50.90] 06/14/2015  . Depression [F32.9] 06/14/2015  . Anxiety [F41.9] 06/14/2015  . Suicidal ideations [R45.851] 05/31/2015  . Exacerbation of Crohn's disease (HCC) [K50.90] 05/27/2015  . Hypokalemia [E87.6] 05/27/2015  . Crohn's disease of both small and large intestine without complication (HCC) [K50.80]   . LLQ abdominal pain [R10.32]   . 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 Chronic pain disorder [G89.4] 05/26/2015  . Chronic back pain greater than 3 months duration [M54.9, G89.29] 11/17/2014  . Tobacco dependence [F17.200] 11/17/2014  . Abdominal pain [R10.9] 11/05/2014  . Metabolic acidosis, increased anion gap (IAG) [E87.2] 11/05/2014  . Crohn's colitis (HCC) [K50.10] 10/26/2014  . Acute hypokalemia [E87.6] 10/26/2014  . Acute hyperglycemia  [R73.09] 10/26/2014  . Refractory nausea and vomiting [R11.2] 10/26/2014  . Dehydration, moderate [E86.0] 10/26/2014  . Microcytic anemia [D50.9] 10/26/2014  . Bipolar affective disorder (HCC) [F31.9] 10/26/2014  . Anxiety disorder [F41.9] 10/26/2014  . Acute Crohn's disease (HCC) [K50.90] 10/26/2014   Total Time spent with patient: 25 minutes  Past Psychiatric History:  Bipolar Disorder, and describes episodes of increased energy, decreased need for sleep. Two prior psychiatric admissions, most recent three years ago for depression . Denies history of psychosis, denies history of violence. Attributes depression to the Chron's Disease at least in part .  on Xanax x 2 years, on Neurontin x 5 years. States he tried Lithium but it was poorly tolerated, and had been on Wellbutrin in the past .   Past Medical History:  Past Medical History  Diagnosis Date  . Anxiety   . Bipolar depression (HCC)   . Crohn disease (HCC) 1997  . Anemia     Around 2013 required PRBC transfusions. Has received parenteral iron in past. Has taken oral iron in past.  . Crohn's disease [facility])     Past Surgical History  Procedure Laterality Date  . Subtotal colectomy      Has undergone a total of 3 separate bowel resections including terminal ileal resection and the equivalent of subtotal colectomy. Last surgery was in 2010.  Marland Kitchen Flexible sigmoidoscopy N/A 10/29/2014    Procedure: FLEXIBLE SIGMOIDOSCOPY;  Surgeon: Ruffin Frederick, MD;  Location[facility] ENDOSCOPY;  Service: Gastroenterology;  Laterality: N/A;   Family History:  Family History  Problem Relation Age of Onset  .  Hypertension Father    Family Psychiatric  History: States mother has been diagnosed with Bipolar Disorder, and with Borderline Personality Disorder, one maternal uncle committed suicide. History of alcohol abuse in extended family . Social History:  History  Alcohol Use No     History  Drug Use No    Social History   Social  History  . Marital Status: Single    Spouse Name: N/A  . Number of Children: N/A  . Years of Education: N/A   Social History Main Topics  . Smoking status: Current Every Day Smoker -- 1.00 packs/day for 5 years    Types: Cigarettes  . Smokeless tobacco: Never Used  . Alcohol Use: No  . Drug Use: No  . Sexual Activity: Not Currently   Other Topics Concern  . None   Social History Narrative   Disabled   Single   Says he has moved to [place]   06/14/2015      Additional Social History:   Sleep: Fair  Appetite:  Good  Current Medications: Current Facility-Administered Medications  Medication Dose Route Frequency Provider Last Rate Last Dose  . acetaminophen (TYLENOL) tablet 650 mg  650 mg Oral Q6H PRN Adonis Brook, NP      . azaTHIOprine [facility]) tablet 175 mg  175 mg Oral Daily Adonis Brook, NP   175 mg at 07/25/15 0746  . famotidine (PEPCID) tablet 20 mg  20 mg Oral BID Adonis Brook, NP   20 mg at 07/25/15 0750  . feeding supplement (ENSURE ENLIVE) (ENSURE ENLIVE) liquid 237 mL  237 mL Oral BID BM Rockey Situ Cobos, MD   237 mL at 07/24/15 1000  . gabapentin (NEURONTIN) capsule 800 mg  800 mg Oral TID Adonis Brook, NP   800 mg at 07/25/15 1212  . hydrOXYzine (ATARAX/VISTARIL) tablet 25 mg  25 mg Oral Q6H PRN Beau Fanny, FNP   25 mg at 07/23/15 2020  . lamoTRIgine (LAMICTAL) tablet 25 mg  25 mg Oral BID Adonis Brook, NP   25 mg at 07/25/15 0747  . loperamide (IMODIUM) capsule 2 mg  2 mg Oral QID Adonis Brook, NP   2 mg at 07/25/15 1212  . magnesium hydroxide (MILK OF MAGNESIA) suspension 30 mL  30 mL Oral Daily PRN Adonis Brook, NP      . nicotine (NICODERM CQ - dosed in mg/24 hours) patch 21 mg  21 mg Transdermal Daily Craige Cotta, MD   21 mg at 07/25/15 0747  . ondansetron (ZOFRAN) tablet 4 mg  4 mg Oral Q6H Adonis Brook, NP   4 mg at 07/25/15 1009  . oxyCODONE-acetaminophen (PERCOCET/ROXICET) 5-325 MG per tablet 1 tablet  1 tablet Oral Q6H PRN  Adonis Brook, NP   1 tablet at 07/25/15 1215  . [START ON 07/26/2015] predniSONE (DELTASONE) tablet 40 mg  40 mg Oral Q breakfast Adonis Brook, NP       Followed by  . [START ON 07/27/2015] predniSONE (DELTASONE) tablet 30 mg  30 mg Oral Q breakfast Adonis Brook, NP       Followed by  . [START ON 07/28/2015] predniSONE (DELTASONE) tablet 20 mg  20 mg Oral Q breakfast Adonis Brook, NP       Followed by  . [START ON 07/29/2015] predniSONE (DELTASONE) tablet 10 mg  10 mg Oral Q breakfast Adonis Brook, NP      . sertraline (ZOLOFT) tablet 75 mg  75 mg Oral Daily Jomarie Longs, MD   75  mg at 07/25/15 0747  . traZODone (DESYREL) tablet 50 mg  50 mg Oral QHS PRN Adonis Brook, NP   50 mg at 07/23/15 2239    Lab Results: No results found for this or any previous visit (from the past 48 hour(s)).  Blood Alcohol level:  Lab Results  Component Value Date   ETH <5 05/31/2015    Physical Findings: AIMS: Facial and Oral Movements Muscles of Facial Expression: None, normal Lips and Perioral Area: None, normal Jaw: None, normal Tongue: None, normal,Extremity Movements Upper (arms, wrists, hands, fingers): None, normal Lower (legs, knees, ankles, toes): None, normal, Trunk Movements Neck, shoulders, hips: None, normal, Overall Severity Severity of abnormal movements (highest score from questions above): None, normal Incapacitation due to abnormal movements: None, normal Patient's awareness of abnormal movements (rate only patient's report): No Awareness, Dental Status Current problems with teeth and/or dentures?: Yes Does patient usually wear dentures?: No  CIWA:    COWS:     Musculoskeletal: Strength & Muscle Tone: within normal limits Gait & Station: normal Patient leans: N/A  Psychiatric Specialty Exam: Physical Exam  Constitutional: He is oriented to person, place, and time.  Neck: Normal range of motion.  Respiratory: Effort normal.  GI: Soft.  Musculoskeletal: Normal range of  motion.  Neurological: He is alert and oriented to person, place, and time. Coordination and gait normal.  Psychiatric: His behavior is normal. Thought content normal. Cognition and memory are normal. He expresses impulsivity. He exhibits a depressed mood.    Review of Systems  Gastrointestinal:       History of chron's disease   Psychiatric/Behavioral: Positive for depression. Negative for suicidal ideas, hallucinations and substance abuse. The patient is nervous/anxious. The patient does not have insomnia.   All other systems reviewed and are negative.   Blood pressure 108/76, pulse 75, temperature 97.8 F (36.6 C), temperature source Oral, resp. rate 16, height 5\' 1"  (1.549 m), weight 68.493 kg (151 lb).Body mass index is 28.55 kg/(m^2).  General Appearance: Casual  Eye Contact:  Good  Speech:  Clear and Coherent and Normal Rate  Volume:  Normal  Mood:  Depressed  Affect:  Depressed  Thought Process:  Coherent and Goal Directed  Orientation:  Full (Time, Place, and Person)  Thought Content:  Rumination  Suicidal Thoughts:  No  Homicidal Thoughts:  No  Memory:  Immediate;   Good Recent;   Good Remote;   Good  Judgement:  Good  Insight:  Fair  Psychomotor Activity:  Normal  Concentration:  Concentration: Fair and Attention Span: Fair  Recall:  Good  Fund of Knowledge:  Fair  Language:  Fair  Akathisia:  No  Handed:  Right  AIMS (if indicated):     Assets:  Communication Skills Desire for Improvement  ADL's:  Intact  Cognition:  WNL  Sleep:  Number of Hours: 5.75     Treatment Plan Summary: Daily contact with patient to assess and evaluate symptoms and progress in treatment and Medication management  Q 15 minute check for safety Estimated LOS 5-7 days Continue to encourage attendance/participation in group sessions Medications management: Lamictal 25 mg BID mood stability, Percocet 5/325 mg Q6hr PRN pain, Zoloft 75 mg QD depression, Trazodone 50 mg PRN sleep Social  work to work with patient on discharge planning, follow up, and concerns  Rankin, Shuvon, NP 07/25/2015, 4:29 PM  I reviewed chart and agreed with the findings and treatment Plan.  Kathryne Sharper, MD

## 2015-07-25 NOTE — [facility] Group Notes (Signed)
[facility] LCSW Group Therapy  07/25/2015 10:00 AM  Type of Therapy:  Group Therapy  Participation Level:  Active  Participation Quality:  Appropriate and Attentive  Affect:  Appropriate  Cognitive:  Alert, Appropriate and Oriented  Insight:  Improving  Engagement in Therapy:  Engaged  Modes of Intervention:  Discussion  Summary of Progress/Problems: Group today was about "Show and Tell." Participants were able to discuss and develop process to engage several parts of their identity for self-actualization and goal planning. Patients were able to discuss how they identify themselves, how their past has defined them and how others perceive them. Patients engaged in identifying goals to reflect how they can use the concepts discussed in creating appropriate plans to achieve goals. Patient was able to share the change in identity that he has experienced by acknowledging challenges and moving forward with changes that have supported further growth.   Beverly Sessions 07/25/2015, 3:49 PM

## 2015-07-25 NOTE — Progress Notes (Signed)
Patient has been up in the dayroom laughing and talking with peers. We discussed his scheduled medications and he was concerned because his tramadol was completed. Writer encouraged him to talk with the doctor on tomorrow concerning this matter. He asked that he be woke up for his next dose of pain medicine. He reports that he plans to check on starting outpatient therapy once discharged. Support given and safety maintained on unit with 15 min checks.

## 2015-07-25 NOTE — Progress Notes (Signed)
D: Patient denies SI/HI and A/V hallucinations; patient reports sleep is fair; reports appetite is fair; reports energy level is normal ; reports ability to concentrate is poor; rates depression as 4/10; rates hopelessness 3/10; rates anxiety as 4/10; patient reports that he wants to work on his plan for discharge  A: Monitored q 15 minutes; patient encouraged to attend groups; patient educated about medications; patient given medications per physician orders; patient encouraged to express feelings and/or concerns  R: Patient is animated and cooperative; patient is pleasant; patient's interaction with staff and peers is assertive; patient was able to set goal to talk with staff 1:1 when having feelings of SI; patient is taking medications as prescribed and tolerating medications; patient is attending all groups

## 2015-07-26 MED ORDER — TRAMADOL HCL 50 MG PO TABS
50.0000 mg | ORAL_TABLET | Freq: Once | ORAL | Status: AC | PRN
  Administered 2015-07-26: 50 mg via ORAL
  Filled 2015-07-26: qty 1

## 2015-07-26 NOTE — Progress Notes (Signed)
[facility] MD Progress Note  07/26/2015 9:09 AM Andre [name]  MRN:  [id number]   Subjective:  "Someone discontinued my ultram and I need this medication is helps me sleep."  On evaluation:  Andre [name] is awake, alert and oriented X4.  Seen sitting in dayroom interacting with peers Denies suicidal or homicidal ideation. Denies auditory or visual hallucination and does not appear to be responding to internal stimuli. Patient reports he is medication compliant without mediation side effects. Reports a recent discharged from [facility] about one month ago.Reports he has been attending group sessions however  benefit this time since was recently discharged  States his depression 5/10. Reports good appetiteand states he is resting well. Support, encouragement and reassurance was provided.   Principal Problem: MDD (major depressive disorder), recurrent severe, without psychosis (HCC) Diagnosis:   Patient Active Problem List   Diagnosis Date Noted  . Insomnia [G47.00]   . Anxiety state [F41.1]   . MDD (major depressive disorder), recurrent severe, without psychosis (HCC) [F33.2] 07/24/2015  . Diarrhea [R19.7]   . Crohn's disease (HCC) [K50.90] 06/14/2015  . Depression [F32.9] 06/14/2015  . Anxiety [F41.9] 06/14/2015  . Suicidal ideations [R45.851] 05/31/2015  . Exacerbation of Crohn's disease (HCC) [K50.90] 05/27/2015  . Hypokalemia [E87.6] 05/27/2015  . Crohn's disease of both small and large intestine without complication (HCC) [K50.80]   . LLQ abdominal pain [R10.32]   . 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 Chronic pain disorder [G89.4] 05/26/2015  . Chronic back pain greater than 3 months duration [M54.9, G89.29] 11/17/2014  . Tobacco dependence [F17.200] 11/17/2014  . Abdominal pain [R10.9] 11/05/2014  . Metabolic acidosis, increased anion gap (IAG) [E87.2] 11/05/2014  . Crohn's colitis (HCC) [K50.10] 10/26/2014  . Acute hypokalemia [E87.6] 10/26/2014  . Acute hyperglycemia [R73.09] 10/26/2014  .  Refractory nausea and vomiting [R11.2] 10/26/2014  . Dehydration, moderate [E86.0] 10/26/2014  . Microcytic anemia [D50.9] 10/26/2014  . Bipolar affective disorder (HCC) [F31.9] 10/26/2014  . Anxiety disorder [F41.9] 10/26/2014  . Acute Crohn's disease (HCC) [K50.90] 10/26/2014   Total Time spent with patient: 25 minutes  Past Psychiatric History:  Bipolar Disorder, and describes episodes of increased energy, decreased need for sleep. Two prior psychiatric admissions, most recent three years ago for depression . Denies history of psychosis, denies history of violence. Attributes depression to the Chron's Disease at least in part .  on Xanax x 2 years, on Neurontin x 5 years. States he tried Lithium but it was poorly tolerated, and had been on Wellbutrin in the past .   Past Medical History:  Past Medical History  Diagnosis Date  . Anxiety   . Bipolar depression (HCC)   . Crohn disease (HCC) 1997  . Anemia     Around 2013 required PRBC transfusions. Has received parenteral iron in past. Has taken oral iron in past.  . Crohn's disease [facility])     Past Surgical History  Procedure Laterality Date  . Subtotal colectomy      Has undergone a total of 3 separate bowel resections including terminal ileal resection and the equivalent of subtotal colectomy. Last surgery was in 2010.  Marland Kitchen Flexible sigmoidoscopy N/A 10/29/2014    Procedure: FLEXIBLE SIGMOIDOSCOPY;  Surgeon: Ruffin Frederick, MD;  Location: [facility] ENDOSCOPY;  Service: Gastroenterology;  Laterality: N/A;   Family History:  Family History  Problem Relation Age of Onset  . Hypertension Father    Family Psychiatric  History: States mother has been diagnosed with Bipolar Disorder, and with Borderline Personality Disorder,  one maternal uncle committed suicide. History of alcohol abuse in extended family . Social History:  History  Alcohol Use No     History  Drug Use No    Social History   Social History  . Marital Status:  Single    Spouse Name: N/A  . Number of Children: N/A  . Years of Education: N/A   Social History Main Topics  . Smoking status: Current Every Day Smoker -- 1.00 packs/day for 5 years    Types: Cigarettes  . Smokeless tobacco: Never Used  . Alcohol Use: No  . Drug Use: No  . Sexual Activity: Not Currently   Other Topics Concern  . None   Social History Narrative   Disabled   Single   Says he has moved to [place]   06/14/2015      Additional Social History:   Sleep: Fair  Appetite:  Good  Current Medications: Current Facility-Administered Medications  Medication Dose Route Frequency Provider Last Rate Last Dose  . acetaminophen (TYLENOL) tablet 650 mg  650 mg Oral Q6H PRN Adonis Brook, NP      . azaTHIOprine [facility]) tablet 175 mg  175 mg Oral Daily Adonis Brook, NP   175 mg at 07/26/15 0756  . famotidine (PEPCID) tablet 20 mg  20 mg Oral BID Adonis Brook, NP   20 mg at 07/26/15 0755  . feeding supplement (ENSURE ENLIVE) (ENSURE ENLIVE) liquid 237 mL  237 mL Oral BID BM Rockey Situ Cobos, MD   237 mL at 07/24/15 1000  . gabapentin (NEURONTIN) capsule 800 mg  800 mg Oral TID Adonis Brook, NP   800 mg at 07/26/15 0757  . hydrOXYzine (ATARAX/VISTARIL) tablet 25 mg  25 mg Oral Q6H PRN Beau Fanny, FNP   25 mg at 07/23/15 2020  . lamoTRIgine (LAMICTAL) tablet 25 mg  25 mg Oral BID Adonis Brook, NP   25 mg at 07/26/15 0756  . loperamide (IMODIUM) capsule 2 mg  2 mg Oral QID Adonis Brook, NP   2 mg at 07/26/15 0756  . magnesium hydroxide (MILK OF MAGNESIA) suspension 30 mL  30 mL Oral Daily PRN Adonis Brook, NP      . nicotine (NICODERM CQ - dosed in mg/24 hours) patch 21 mg  21 mg Transdermal Daily Craige Cotta, MD   21 mg at 07/26/15 0759  . ondansetron (ZOFRAN) tablet 4 mg  4 mg Oral Q6H Adonis Brook, NP   4 mg at 07/26/15 0756  . oxyCODONE-acetaminophen (PERCOCET/ROXICET) 5-325 MG per tablet 1 tablet  1 tablet Oral Q6H PRN Adonis Brook, NP   1 tablet  at 07/26/15 0622  . [START ON 07/27/2015] predniSONE (DELTASONE) tablet 30 mg  30 mg Oral Q breakfast Adonis Brook, NP       Followed by  . [START ON 07/28/2015] predniSONE (DELTASONE) tablet 20 mg  20 mg Oral Q breakfast Adonis Brook, NP       Followed by  . [START ON 07/29/2015] predniSONE (DELTASONE) tablet 10 mg  10 mg Oral Q breakfast Adonis Brook, NP      . sertraline (ZOLOFT) tablet 75 mg  75 mg Oral Daily Jomarie Longs, MD   75 mg at 07/26/15 0755  . traZODone (DESYREL) tablet 50 mg  50 mg Oral QHS PRN Adonis Brook, NP   50 mg at 07/23/15 2239    Lab Results: No results found for this or any previous visit (from the past 48 hour(s)).  Blood Alcohol  level:  Lab Results  Component Value Date   ETH <5 05/31/2015    Physical Findings: AIMS: Facial and Oral Movements Muscles of Facial Expression: None, normal Lips and Perioral Area: None, normal Jaw: None, normal Tongue: None, normal,Extremity Movements Upper (arms, wrists, hands, fingers): None, normal Lower (legs, knees, ankles, toes): None, normal, Trunk Movements Neck, shoulders, hips: None, normal, Overall Severity Severity of abnormal movements (highest score from questions above): None, normal Incapacitation due to abnormal movements: None, normal Patient's awareness of abnormal movements (rate only patient's report): No Awareness, Dental Status Current problems with teeth and/or dentures?: Yes Does patient usually wear dentures?: No  CIWA:    COWS:     Musculoskeletal: Strength & Muscle Tone: within normal limits Gait & Station: normal Patient leans: N/A  Psychiatric Specialty Exam: Physical Exam  Constitutional: He is oriented to person, place, and time.  Neck: Normal range of motion.  Respiratory: Effort normal.  GI: Soft.  Musculoskeletal: Normal range of motion.  Neurological: He is alert and oriented to person, place, and time. Coordination and gait normal.  Psychiatric: His behavior is normal.  Thought content normal. Cognition and memory are normal. He expresses impulsivity. He exhibits a depressed mood.    Review of Systems  Gastrointestinal:       History of chron's disease   Psychiatric/Behavioral: Positive for depression. Negative for suicidal ideas, hallucinations and substance abuse. The patient is nervous/anxious. The patient does not have insomnia.   All other systems reviewed and are negative.   Blood pressure 128/98, pulse 80, temperature 98.2 F (36.8 C), temperature source Oral, resp. rate 80, height 5\' 1"  (1.549 m), weight 68.493 kg (151 lb).Body mass index is 28.55 kg/(m^2).  General Appearance: Casual  Eye Contact:  Good  Speech:  Clear and Coherent and Normal Rate  Volume:  Normal  Mood:  Depressed  Affect:  Depressed  Thought Process:  Coherent and Goal Directed  Orientation:  Full (Time, Place, and Person)  Thought Content:  Rumination  Suicidal Thoughts:  No  Homicidal Thoughts:  No  Memory:  Immediate;   Good Recent;   Good Remote;   Good  Judgement:  Good  Insight:  Fair  Psychomotor Activity:  Normal  Concentration:  Concentration: Fair and Attention Span: Fair  Recall:  Good  Fund of Knowledge:  Fair  Language:  Fair  Akathisia:  No  Handed:  Right  AIMS (if indicated):     Assets:  Communication Skills Desire for Improvement  ADL's:  Intact  Cognition:  WNL  Sleep:  Number of Hours: 5.75    I agree with current treatment plan on 07/26/2015, Patient seen face-to-face for psychiatric evaluation follow-up, chart reviewed. Reviewed the information documented and agree with the treatment plan.  Treatment Plan Summary: Daily contact with patient to assess and evaluate symptoms and progress in treatment and Medication management  Medications management:  Contiunue Lamictal 25 mg BID mood stability,  Continue Percocet 5/325 mg Q6hr PRN pain Continue Zoloft 75 mg QD PO for  depression Contiune Trazodone 50 mg PRN insomnia Social work to work  with patient on discharge planning, follow up, and concerns One time oder placed for Ultram 50 mg PO QHS  PRN Q 15 minute check for safety Estimated LOS 5-7 days Continue to encourage attendance/participation in group sessions  Oneta Rack, NP 07/26/2015, 9:09 AM  I reviewed chart and agreed with the findings and treatment Plan.  Kathryne Sharper, MD

## 2015-07-26 NOTE — Progress Notes (Signed)
DAR NOTE: Pt present with bright affect and jovial mood in the unit. Pt attended and participated in the group, pt has been interacting with peer in the day room most of the day. Pt complained abdominal pain, medications given as scheduled. As per self inventory, pt had a poor night sleep, fair appetite, high energy, and poor concentration. Pt rate depression at 3, hopeless ness at 1, and anxiety at a 4. Pt's safety ensured with 15 minute and environmental checks. Pt currently denies SI/HI and A/V hallucinations. Pt verbally agrees to seek staff if SI/HI or A/VH occurs and to consult with staff before acting on these thoughts. Will continue POC.

## 2015-07-26 NOTE — [facility] Group Notes (Signed)
[facility] Group Notes:  (Nursing/MHT/Case Management/Adjunct)  Date:  07/26/2015  Time:  10:48 AM   Type of Therapy:  Psychoeducational Skills  Participation Level:  Active  Participation Quality:  Appropriate  Affect:  Appropriate  Cognitive:  Appropriate  Insight:  Appropriate  Engagement in Group:  Engaged  Modes of Intervention:  Problem-solving  Summary of Progress/Problems:  Topic was on self care, Pt sated self care is " Taking care of own needs ( basic needs)."  Bethann Punches 07/26/2015, 10:48 AM

## 2015-07-26 NOTE — Progress Notes (Signed)
Patient has been up and active on the unit, attended group and participated.he has been up in the dayroom laughing and playing cards with peers. He will ask for pain medications periodically for his abdominal pain which he has received his prn tramadol. Patient currently denies si/hi/a/v hall. Support and encouragement offered, safety maintained on unit, will continue to monitor.

## 2015-07-26 NOTE — [facility] Group Notes (Signed)
[facility] Group Notes:  (Clinical Social Work)   07/26/2015   1:15-2:15PM  Summary of Progress/Problems:   The main focus of today's process group was to   1)  Examine first impressions;  2)  Discuss what characteristics, beyond people's first impressions, are important to know;  3)  Look at the supports being viewed differently when only the superficial first impressions are considered;  4)  Attempt to integrate a broader view of supports, accepting the limits of support they may offer; and  5)  Acknowledge the need to increase professional supports.  The patient expressed positive feelings toward the other patients, was very supportive of them.  He volunteered to go first in our activity, and was very involved and insightful.  Type of Therapy:  Process Group with Motivational Interviewing  Participation Level:  Active  Participation Quality:  Attentive, Sharing and Supportive  Affect:  Blunted and Depressed  Cognitive:  Alert and Appropriate  Insight:  Engaged and Supportive  Engagement in Therapy:  Engaged  Modes of Intervention:   Education, Support and Processing, Activity  Ambrose Mantle, LCSW 07/26/2015    4:17 PM

## 2015-07-26 NOTE — Progress Notes (Signed)
[facility] Group Notes:  (Nursing/MHT/Case Management/Adjunct)  Date:  07/26/2015  Time:  12:53 AM  Type of Therapy:  Psychoeducational Skills  Participation Level:  Minimal  Participation Quality:  Attentive  Affect:  Appropriate  Cognitive:  Appropriate  Insight:  Appropriate  Engagement in Group:  Developing/Improving  Modes of Intervention:  Education  Summary of Progress/Problems: The patient was upbeat in group since he said that his roommate told him over the phone that she took care of his recent move to an apartment complex. As a theme for the day, his coping skill will be to listen to music.   Velena Keegan S 07/26/2015, 12:53 AM

## 2015-07-27 NOTE — Progress Notes (Addendum)
Patient ID: Andre [name], male   DOB: September 20, 1985, 30 y.o.   MRN: [id number] [facility] MD Progress Note  07/27/2015 2:21 PM Andre [name]  MRN:  [id number]   Subjective:  Patient reports he is feeling " better", and states " I am not sure I need to be in the hospital". Denies medication side effects at this time.  Objective : I have discussed case with treatment team and have met with patient . Patient is a 30 year old male, who is known to our unit from prior admission . He has a history of Chron's Disease, which has been severe, persistent, and has required colectomy, ileorectal anastomosis- he is on imuran and on steroid treatment . Presented to ED due to abdominal pain , discomfort and during assessment made suicidal statements .  At this time, as above, states he is doing " better", denies suicidal ideations, and is more future oriented- hoping to be discharged soon- lives with friend in [place] area . Denies medication side effects. At this time no disruptive or agitated behaviors on unit   Principal Problem: MDD (major depressive disorder), recurrent severe, without psychosis ([place]) Diagnosis:   Patient Active Problem List   Diagnosis Date Noted  . Insomnia [G47.00]   . Anxiety state [F41.1]   . MDD (major depressive disorder), recurrent severe, without psychosis ([place]) [F33.2] 07/24/2015  . Diarrhea [R19.7]   . Crohn's disease ([address]) [K50.90] 06/14/2015  . Depression [F32.9] 06/14/2015  . Anxiety [F41.9] 06/14/2015  . Suicidal ideations [R45.851] 05/31/2015  . Exacerbation of Crohn's disease ([place]) [K50.90] 05/27/2015  . Hypokalemia [E87.6] 05/27/2015  . Crohn's disease of both small and large intestine without complication ([place]) [G18.29]   . LLQ abdominal pain [R10.32]   . 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 Chronic pain disorder [G89.4] 05/26/2015  . Chronic back pain greater than 3 months duration [M54.9, G89.29] 11/17/2014  . Tobacco dependence [F17.200] 11/17/2014  . Abdominal pain  [R10.9] 11/05/2014  . Metabolic acidosis, increased anion gap (IAG) [E87.2] 11/05/2014  . Crohn's colitis ([place]) [K50.10] 10/26/2014  . Acute hypokalemia [E87.6] 10/26/2014  . Acute hyperglycemia [R73.09] 10/26/2014  . Refractory nausea and vomiting [R11.2] 10/26/2014  . Dehydration, moderate [E86.0] 10/26/2014  . Microcytic anemia [D50.9] 10/26/2014  . Bipolar affective disorder ([place]) [F31.9] 10/26/2014  . Anxiety disorder [F41.9] 10/26/2014  . Acute Crohn's disease ([place]) [K50.90] 10/26/2014   Total Time spent with patient: 25 minutes  Past Psychiatric History:  Bipolar Disorder, and describes episodes of increased energy, decreased need for sleep. Two prior psychiatric admissions, most recent three years ago for depression . Denies history of psychosis, denies history of violence. Attributes depression to the Chron's Disease at least in part .  on Xanax x 2 years, on Neurontin x 5 years. States he tried Lithium but it was poorly tolerated, and had been on Wellbutrin in the past .   Past Medical History:  Past Medical History  Diagnosis Date  . Anxiety   . Bipolar depression ([place])   . Crohn disease ([place]) 1997  . Anemia     Around 2013 required PRBC transfusions. Has received parenteral iron in past. Has taken oral iron in past.  . Crohn's disease [facility])     Past Surgical History  Procedure Laterality Date  . Subtotal colectomy      Has undergone a total of 3 separate bowel resections including terminal ileal resection and the equivalent of subtotal colectomy. Last surgery was in 2010.  Marland Kitchen Flexible sigmoidoscopy N/A 10/29/2014  Procedure: FLEXIBLE SIGMOIDOSCOPY;  Surgeon: Manus Gunning, MD;  Location: [facility] ENDOSCOPY;  Service: Gastroenterology;  Laterality: N/A;   Family History:  Family History  Problem Relation Age of Onset  . Hypertension Father    Family Psychiatric  History: States mother has been diagnosed with Bipolar Disorder, and with Borderline Personality  Disorder, one maternal uncle committed suicide. History of alcohol abuse in extended family . Social History:  History  Alcohol Use No     History  Drug Use No    Social History   Social History  . Marital Status: Single    Spouse Name: N/A  . Number of Children: N/A  . Years of Education: N/A   Social History Main Topics  . Smoking status: Current Every Day Smoker -- 1.00 packs/day for 5 years    Types: Cigarettes  . Smokeless tobacco: Never Used  . Alcohol Use: No  . Drug Use: No  . Sexual Activity: Not Currently   Other Topics Concern  . None   Social History Narrative   Disabled   Single   Says he has moved to [place]   06/14/2015      Additional Social History:   Sleep: improved   Appetite:  Good  Current Medications: Current Facility-Administered Medications  Medication Dose Route Frequency Provider Last Rate Last Dose  . acetaminophen (TYLENOL) tablet 650 mg  650 mg Oral Q6H PRN Kerrie Buffalo, NP      . azaTHIOprine [facility]) tablet 175 mg  175 mg Oral Daily Kerrie Buffalo, NP   175 mg at 07/27/15 6546  . famotidine (PEPCID) tablet 20 mg  20 mg Oral BID Kerrie Buffalo, NP   20 mg at 07/27/15 0813  . feeding supplement (ENSURE ENLIVE) (ENSURE ENLIVE) liquid 237 mL  237 mL Oral BID BM Myer Peer Juel Ripley, MD   237 mL at 07/24/15 1000  . gabapentin (NEURONTIN) capsule 800 mg  800 mg Oral TID Kerrie Buffalo, NP   800 mg at 07/27/15 1151  . hydrOXYzine (ATARAX/VISTARIL) tablet 25 mg  25 mg Oral Q6H PRN Benjamine Mola, FNP   25 mg at 07/23/15 2020  . lamoTRIgine (LAMICTAL) tablet 25 mg  25 mg Oral BID Kerrie Buffalo, NP   25 mg at 07/27/15 5035  . loperamide (IMODIUM) capsule 2 mg  2 mg Oral QID Kerrie Buffalo, NP   2 mg at 07/27/15 1151  . magnesium hydroxide (MILK OF MAGNESIA) suspension 30 mL  30 mL Oral Daily PRN Kerrie Buffalo, NP      . nicotine (NICODERM CQ - dosed in mg/24 hours) patch 21 mg  21 mg Transdermal Daily Jenne Campus, MD   21 mg at 07/27/15  0813  . ondansetron (ZOFRAN) tablet 4 mg  4 mg Oral Q6H Kerrie Buffalo, NP   4 mg at 07/27/15 4656  . oxyCODONE-acetaminophen (PERCOCET/ROXICET) 5-325 MG per tablet 1 tablet  1 tablet Oral Q6H PRN Kerrie Buffalo, NP   1 tablet at 07/27/15 1216  . [START ON 07/28/2015] predniSONE (DELTASONE) tablet 20 mg  20 mg Oral Q breakfast Kerrie Buffalo, NP       Followed by  . [START ON 07/29/2015] predniSONE (DELTASONE) tablet 10 mg  10 mg Oral Q breakfast Kerrie Buffalo, NP      . sertraline (ZOLOFT) tablet 75 mg  75 mg Oral Daily Ursula Alert, MD   75 mg at 07/27/15 0812  . traZODone (DESYREL) tablet 50 mg  50 mg Oral QHS PRN Kerrie Buffalo,  NP   50 mg at 07/27/15 0010    Lab Results: No results found for this or any previous visit (from the past 48 hour(s)).  Blood Alcohol level:  Lab Results  Component Value Date   ETH <5 05/31/2015    Physical Findings: AIMS: Facial and Oral Movements Muscles of Facial Expression: None, normal Lips and Perioral Area: None, normal Jaw: None, normal Tongue: None, normal,Extremity Movements Upper (arms, wrists, hands, fingers): None, normal Lower (legs, knees, ankles, toes): None, normal, Trunk Movements Neck, shoulders, hips: None, normal, Overall Severity Severity of abnormal movements (highest score from questions above): None, normal Incapacitation due to abnormal movements: None, normal Patient's awareness of abnormal movements (rate only patient's report): No Awareness, Dental Status Current problems with teeth and/or dentures?: Yes Does patient usually wear dentures?: No  CIWA:    COWS:     Musculoskeletal: Strength & Muscle Tone: within normal limits Gait & Station: normal Patient leans: N/A  Psychiatric Specialty Exam: Physical Exam  Constitutional: He is oriented to person, place, and time.  Neck: Normal range of motion.  Respiratory: Effort normal.  GI: Soft.  Musculoskeletal: Normal range of motion.  Neurological: He is alert and  oriented to person, place, and time. Coordination and gait normal.  Psychiatric: His behavior is normal. Thought content normal. Cognition and memory are normal. He expresses impulsivity. He exhibits a depressed mood.    Review of Systems  Gastrointestinal:       History of chron's disease   Psychiatric/Behavioral: Positive for depression. Negative for suicidal ideas, hallucinations and substance abuse. The patient is nervous/anxious. The patient does not have insomnia.   All other systems reviewed and are negative.   Blood pressure 109/70, pulse 78, temperature 98.3 F (36.8 C), temperature source Oral, resp. rate 16, height _0  (1.549 m), weight 151 lb (68.493 kg).Body mass index is 28.55 kg/(m^2).  General Appearance: Fairly Groomed  Eye Contact:  Good  Speech:  Clear and Coherent and Normal Rate  Volume:  Normal  Mood:  Reports he is feeling  Better, less depressed   Affect:  Mildly constricted, but reactive   Thought Process:  Coherent and Goal Directed  Orientation:  Full (Time, Place, and Person)  Thought Content:  No hallucinations, no delusions  Suicidal Thoughts:  No- denies any suicidal ideations, denies any homicidal or violent ideations   Homicidal Thoughts:  No  Memory:  Immediate;   Good Recent;   Good Remote;   Good  Judgement:  Good  Insight:  Fair  Psychomotor Activity:  Normal  Concentration:  Good   Recall:  Good  Fund of Knowledge:  Good  Language:  Good  Akathisia:  No  Handed:  Right  AIMS (if indicated):     Assets:  Communication Skills Desire for Improvement  ADL's:  Intact  Cognition:  WNL  Sleep:  Number of Hours: 5.75   Assessment - patient is currently improving, presents mildly dysphoric, depressed, but states he is feeling better- abdominal pain is subsiding and at this time not in any acute distress or acute discomfort. Denies suicidal ideations at this time, and is tolerating Zoloft trial well. He is hoping for discharge soon, plans to  return to live with his friend in [place].   Treatment Plan Summary: Daily contact with patient to assess and evaluate symptoms and progress in treatment and Medication management  Medications management:  Contiunue Lamictal 25 mg BID for mood disorder  Continue Neurontin 800 mgrs TID for anxiety, pain  Continue Percocet 5/325 mg Q6H  PRN pain Continue Zoloft 75 mg QDAY  PO for  depression Contiune Trazodone 50 mg  QHS PRN insomnia, as needed  Treatment team working on disposition planning    Neita Garnet, MD 07/27/2015, 2:21 PM

## 2015-07-27 NOTE — Progress Notes (Signed)
Adult Psychoeducational Group Note  Date:  07/27/2015 Time:  9:25 PM  Group Topic/Focus:  Wrap-Up Group:   The focus of this group is to help patients review their daily goal of treatment and discuss progress on daily workbooks.  Participation Level:  Active  Participation Quality:  Appropriate  Affect:  Appropriate  Cognitive:  Alert  Insight: Appropriate  Engagement in Group:  Engaged  Modes of Intervention:  Discussion  Additional Comments:  Patient states, "It's been a really good day". Patient goal for today was to get discharge plan together". Patient met goal. Patient will be discharging tomorrow.  Riya Huxford L Desjuan Stearns 07/27/2015, 9:25 PM

## 2015-07-27 NOTE — BHH Group Notes (Signed)
[facility] LCSW Aftercare Discharge Planning Group Note  07/27/2015 8:45 AM  Participation Quality: Alert, Appropriate and Oriented  Mood/Affect: Appropriate  Depression Rating: 3  Anxiety Rating: 7  Thoughts of Suicide: Pt denies SI/HI  Will you contract for safety? Yes  Current AVH: Pt denies  Plan for Discharge/Comments: Pt attended discharge planning group and actively participated in group. CSW discussed suicide prevention education with the group and encouraged them to discuss discharge planning and any relevant barriers.Pt reports that he is anxious to see and speak with the MD. No complaints or requests expressed at this time.  Transportation Means: Pt reports access to transportation  Supports: No supports mentioned at this time  Chad Cordial, LCSWA 07/27/2015 4:26 PM

## 2015-07-27 NOTE — Progress Notes (Signed)
DAR NOTE: Pt present with bright affect and jovial  mood in the unit. Pt has been isolating himself and has been bed most of the time. Pt complained of back pain, took all his meds as scheduled. As per self inventory, pt had a good night sleep, good appetite, normal energy, and good concentration. Pt rate depression at 2, hopeless ness at 1, and anxiety at 4. Pt's safety ensured with 15 minute and environmental checks. Pt currently denies SI/HI and A/V hallucinations. Pt verbally agrees to seek staff if SI/HI or A/VH occurs and to consult with staff before acting on these thoughts. Will continue POC.

## 2015-07-27 NOTE — Progress Notes (Signed)
D:Patient in the dayroom playing cards with peers on approach.  Patient states he had a good day today.  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  Patient states he is returning home to his his house with his roommate.  Patient states he continues to be in pain but he states it is chronic.  Patient states, "The only reason I am here is because I got mad at a doctor."  Patient denies SI/HI and denies AVH.   A: Staff to monitor Q 15 mins for safety.  Encouragement and support offered.  Scheduled medications administered per orders.  Vistaril and Trazodone administered prn. R: Patient remains safe on the unit.  Patient attended group tonight.  Patient visible on the unit and interacting with peers.  Patient taking administered medications.

## 2015-07-27 NOTE — Progress Notes (Signed)
B[facility] Group Notes:  (Nursing/MHT/Case Management/Adjunct)  Date:  07/27/2015  Time:  12:28 AM  Type of Therapy:  Psychoeducational Skills  Participation Level:  Active  Participation Quality:  Appropriate  Affect:  Appropriate  Cognitive:  Appropriate  Insight:  Improving  Engagement in Group:  Developing/Improving  Modes of Intervention:  Education  Summary of Progress/Problems: The patient shared with the group that he had a good day overall since his roommate came in to visit him. He explained that the roommate brought him money to buy soda from the vending machine. He also stated that he was optimistic since his roommate helped to pay his bills the other day and helped him to move into his apartment.   Andre [name] S 07/27/2015, 12:28 AM

## 2015-07-27 NOTE — Progress Notes (Signed)
Patient ID: Andre [name], male   DOB: 1985-07-08, 30 y.o.   MRN: [id number] PER STATE REGULATIONS 482.30  THIS CHART WAS REVIEWED FOR MEDICAL NECESSITY WITH RESPECT TO THE PATIENT'S ADMISSION/ DURATION OF STAY.  NEXT REVIEW DATE: 07/31/2015 Willa Rough, RN, BSN CASE MANAGER

## 2015-07-28 MED ORDER — AZATHIOPRINE 50 MG PO TABS: 175.0000 mg | ORAL_TABLET | Freq: Every day | ORAL | Status: DC

## 2015-07-28 MED ORDER — GABAPENTIN 400 MG PO CAPS: 800.0000 mg | ORAL_CAPSULE | Freq: Three times a day (TID) | ORAL | Status: AC

## 2015-07-28 MED ORDER — OXYCODONE-ACETAMINOPHEN 5-325 MG PO TABS: 1.0000 | ORAL_TABLET | Freq: Three times a day (TID) | ORAL | Status: DC | PRN

## 2015-07-28 MED ORDER — TRAZODONE HCL 50 MG PO TABS: 50.0000 mg | ORAL_TABLET | Freq: Every evening | ORAL | Status: DC | PRN

## 2015-07-28 MED ORDER — LOPERAMIDE HCL 2 MG PO CAPS: 2.0000 mg | ORAL_CAPSULE | Freq: Four times a day (QID) | ORAL | Status: AC | PRN

## 2015-07-28 MED ORDER — LAMOTRIGINE 25 MG PO TABS: 25.0000 mg | ORAL_TABLET | Freq: Two times a day (BID) | ORAL | Status: AC

## 2015-07-28 MED ORDER — NICOTINE 21 MG/24HR TD PT24: 21.0000 mg | MEDICATED_PATCH | Freq: Every day | TRANSDERMAL | Status: DC

## 2015-07-28 MED ORDER — FAMOTIDINE 20 MG PO TABS: 20.0000 mg | ORAL_TABLET | Freq: Two times a day (BID) | ORAL | Status: DC

## 2015-07-28 MED ORDER — HYDROXYZINE HCL 25 MG PO TABS: 25.0000 mg | ORAL_TABLET | Freq: Four times a day (QID) | ORAL | Status: DC | PRN

## 2015-07-28 MED ORDER — ACETAMINOPHEN 500 MG PO TABS: 500.0000 mg | ORAL_TABLET | Freq: Four times a day (QID) | ORAL | Status: DC | PRN

## 2015-07-28 MED ORDER — ONDANSETRON HCL 4 MG PO TABS: 4.0000 mg | ORAL_TABLET | Freq: Four times a day (QID) | ORAL | Status: DC

## 2015-07-28 MED ORDER — SERTRALINE HCL 25 MG PO TABS: 75.0000 mg | ORAL_TABLET | Freq: Every day | ORAL | Status: DC

## 2015-07-28 NOTE — BHH Suicide Risk Assessment (Signed)
B[facility] INPATIENT:  Family/Significant Other Suicide Prevention Education  Suicide Prevention Education:  Education Completed; Florene Route, Pt's roommate [phone], has been identified by the patient as the family member/significant other with whom the patient will be residing, and identified as the person(s) who will aid the patient in the event of a mental health crisis (suicidal ideations/suicide attempt).  With written consent from the patient, the family member/significant other has been provided the following suicide prevention education, prior to the and/or following the discharge of the patient.  The suicide prevention education provided includes the following:  Suicide risk factors  Suicide prevention and interventions  National Suicide Hotline telephone number  [facility] assessment telephone number  [facility] Emergency Assistance 911  [facility] and/or Residential Mobile Crisis Unit telephone number  Request made of family/significant other to:  Remove weapons (e.g., guns, rifles, knives), all items previously/currently identified as safety concern.    Remove drugs/medications (over-the-counter, prescriptions, illicit drugs), all items previously/currently identified as a safety concern.  The family member/significant other verbalizes understanding of the suicide prevention education information provided.  The family member/significant other agrees to remove the items of safety concern listed above.  Elaina Hoops 07/28/2015, 11:02 AM

## 2015-07-28 NOTE — Progress Notes (Signed)
Discharge instructions and rx gone over with the patient and patient verbalized understanding.  Patient belongings retrieved from locker #44.  Patient pain 2/10 but patient states this is nornmal for him .  Patient denies SI/HI and denies AVH.  Patient had no additional questions or concerns.  Patient was given a taxi voucher because his ride never showed up.

## 2015-07-28 NOTE — Tx Team (Signed)
Interdisciplinary Treatment Plan Update (Adult) Date: 07/28/2015   Date: 07/28/2015 1:13 PM  Progress in Treatment:  Attending groups: Yes  Participating in groups: Yes Taking medication as prescribed: Yes  Tolerating medication: Yes  Family/Significant othe contact made: Yes with roommate Patient understands diagnosis: Yes AEB seeking help with depression Discussing patient identified problems/goals with staff: Yes  Medical problems stabilized or resolved: Yes  Denies suicidal/homicidal ideation: Yes Patient has not harmed self or Others: Yes   New problem(s) identified: None identified at this time.   Discharge Plan or Barriers: Pt will return home and follow-up with outpatient providers; requesting referral to IOP  Additional comments:  Patient and CSW reviewed pt's identified goals and treatment plan. Patient verbalized understanding and agreed to treatment plan. CSW reviewed [facility] "Discharge Process and Patient Involvement" Form. Pt verbalized understanding of information provided and signed form.   Reason for Continuation of Hospitalization:  Anxiety Depression Medication stabilization Suicidal ideation  Estimated length of stay: 0 days  Review of initial/current patient goals per problem list:   1.  Goal(s): Patient will participate in aftercare plan  Met:  Yes  Target date: 3-5 days from date of admission   As evidenced by: Patient will participate within aftercare plan AEB aftercare provider and housing plan at discharge being identified.   06/01/15: Pt will return home and follow-up with outpatient providers  2.  Goal (s): Patient will exhibit decreased depressive symptoms and suicidal ideations.  Met:  Yes  Target date: 3-5 days from date of admission   As evidenced by: Patient will utilize self rating of depression at 3 or below and demonstrate decreased signs of depression or be deemed stable for discharge by MD. 07/24/15: Pt rates depression at 9/10; denies  SI 07/28/2015: Pt rates depression at 2/10; denies SI   3.  Goal(s): Patient will demonstrate decreased signs and symptoms of anxiety.  Met:  Yes  Target date: 3-5 days from date of admission   As evidenced by: Patient will utilize self rating of anxiety at 3 or below and demonstrated decreased signs of anxiety, or be deemed stable for discharge by MD 07/24/15: Pt rates anxiety at 9/10 07/28/2015: Pt rates anxiety at 3/10  Attendees:  Patient:    Family:    Physician: Dr. Parke Poisson, MD 07/28/2015 1:13 PM  Nursing:  07/28/2015 1:13 PM  Clinical Social Worker Peri Maris, [place] 07/28/2015 1:13 PM  Other: Tilden Fossa, LCSWA 07/28/2015 1:13 PM  Clinical: Marilynne Halsted RN; Grayland Ormond, RN 07/28/2015 1:13 PM  Other: , RN Charge Nurse 07/28/2015 1:13 PM  Other:     Peri Maris, [place] Work [phone]

## 2015-07-28 NOTE — Progress Notes (Signed)
D-  Patient is scheduled to discharge this shift.  Patient states that he is having some anxiety over discharged, but overall the patient states that he is ready to go.  Patient attended groups, engaged peers appropriately.  Patient denies SI, HI and AVH.   A- Assess patient for safety offer, medications as prescribed, engage patient in 1:1 staff talks  R- Continue to assess patient as prescribed.

## 2015-07-28 NOTE — Progress Notes (Signed)
  [facility] Adult Case Management Discharge Plan :  Will you be returning to the same living situation after discharge:  Yes,  Pt returning home  At discharge, do you have transportation home?: Yes,  Pt roommate to pick up Do you have the ability to pay for your medications: Yes,  Pt provided with prescriptions  Release of information consent forms completed and in the chart;  Patient's signature needed at discharge.  Patient to Follow up at: Follow-up Information    Follow up with BEHAVIORAL HEALTH INTENSIVE PSYCH On 08/03/2015.   Specialty:  Behavioral Health   Why:  at 8:45am with Jeri Modena to complete your assessment for the Intensive Outpatient Program.   Contact information:   [address] [id number] mc [address] [place] [address] [phone]      Next level of care provider has access to [facility] Link:yes  Safety Planning and Suicide Prevention discussed: Yes,  with roommate; see SPE note  Have you used any form of tobacco in the last 30 days? (Cigarettes, Smokeless Tobacco, Cigars, and/or Pipes): Yes  Has patient been referred to the Quitline?: Patient refused referral  Patient has been referred for addiction treatment: Yes  Elaina Hoops 07/28/2015, 1:15 PM

## 2015-07-28 NOTE — Discharge Summary (Signed)
Physician Discharge Summary Note  Patient:  Andre [name] is an 30 y.o., male MRN:  [id number] DOB:  [birth year] Patient phone:  [phone] (home)  Patient address:   [address] [place] [address],  Total Time spent with patient: Greater than 30 minutes  Date of Admission:  07/23/2015  Date of Discharge: 07-28-15  Reason for Admission: Worsening symptoms of depression/suicidal ideations.  Principal Problem: Bipolar affective disorder [facility])  Discharge Diagnoses: Patient Active Problem List   Diagnosis Date Noted  . Insomnia [G47.00]   . Anxiety state [F41.1]   . MDD (major depressive disorder), recurrent severe, without psychosis (HCC) [F33.2] 07/24/2015  . Diarrhea [R19.7]   . Crohn's disease (HCC) [K50.90] 06/14/2015  . Depression [F32.9] 06/14/2015  . Anxiety [F41.9] 06/14/2015  . Suicidal ideations [R45.851] 05/31/2015  . Exacerbation of Crohn's disease (HCC) [K50.90] 05/27/2015  . Hypokalemia [E87.6] 05/27/2015  . Crohn's disease of both small and large intestine without complication (HCC) [K50.80]   . LLQ abdominal pain [R10.32]   . 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 Chronic pain disorder [G89.4] 05/26/2015  . Chronic back pain greater than 3 months duration [M54.9, G89.29] 11/17/2014  . Tobacco dependence [F17.200] 11/17/2014  . Abdominal pain [R10.9] 11/05/2014  . Metabolic acidosis, increased anion gap (IAG) [E87.2] 11/05/2014  . Crohn's colitis (HCC) [K50.10] 10/26/2014  . Acute hypokalemia [E87.6] 10/26/2014  . Acute hyperglycemia [R73.09] 10/26/2014  . Refractory nausea and vomiting [R11.2] 10/26/2014  . Dehydration, moderate [E86.0] 10/26/2014  . Microcytic anemia [D50.9] 10/26/2014  . Bipolar affective disorder (HCC) [F31.9] 10/26/2014  . Anxiety disorder [F41.9] 10/26/2014  . Acute Crohn's disease (HCC) [K50.90] 10/26/2014   Past Psychiatric History: Bipolar affective disorder  Past Medical History:  Past Medical History  Diagnosis Date  . Anxiety    . Bipolar depression (HCC)   . Crohn disease (HCC) 1997  . Anemia     Around 2013 required PRBC transfusions. Has received parenteral iron in past. Has taken oral iron in past.  . Crohn's disease [facility])     Past Surgical History  Procedure Laterality Date  . Subtotal colectomy      Has undergone a total of 3 separate bowel resections including terminal ileal resection and the equivalent of subtotal colectomy. Last surgery was in 2010.  Marland Kitchen Flexible sigmoidoscopy N/A 10/29/2014    Procedure: FLEXIBLE SIGMOIDOSCOPY;  Surgeon: Ruffin Frederick, MD;  Location: [facility] ENDOSCOPY;  Service: Gastroenterology;  Laterality: N/A;   Family History:  Family History  Problem Relation Age of Onset  . Hypertension Father    Family Psychiatric  History: See H&P  Social History:  History  Alcohol Use No     History  Drug Use No    Social History   Social History  . Marital Status: Single    Spouse Name: N/A  . Number of Children: N/A  . Years of Education: N/A   Social History Main Topics  . Smoking status: Current Every Day Smoker -- 1.00 packs/day for 5 years    Types: Cigarettes  . Smokeless tobacco: Never Used  . Alcohol Use: No  . Drug Use: No  . Sexual Activity: Not Currently   Other Topics Concern  . None   Social History Narrative   Disabled   Single   Says he has moved to [place]   06/14/2015      Hospital Course: Andre [name] is a 30 year old male who came initially to [facility] with abdominal pain. When he found out  that he was being discharged in pain and without pain meds, he became suicidal. He verbalized that he felt he would harm himself. Patient has long hx of Crohns, diagnosed at 30 years of age. He is quite detailed in his description of current state of his illness. He's had a 3 colostomies and now only has an ileum attached to his rectum. He has been tried on several immunosuppressants Methotrexate, Asacol. He is pending a trial of Entivio once he returns to [facility] practice. [facility] group manages his chronic pain related to Crohns. When he was seen for aseesment, patient is anxious about his pain regimens. He feels that life is out of control as his Crohns flare ups almost every monthe per his report.  Andre [name] is known in this [facility] hospital for worsening symptoms of depression & had received mood stabilization treatments in the past. This time around, he was admitted for threatening suicide after he could get a prescription for pain medication at the [facility] in [place], [place]. He was brought to the [facility] adult unit for mood stabilization treatments. [place] has long history of Crohn's disease that had rendered to have colostomies in the past. [place] has always cited worsening chronic medical condition (Crohn's Ds) as the reason for the worsening symptoms of his depression. This time around, he threatened to feel unsafe if discharged from the [facility]. After his admission assessment, Andre [name]'s presenting symptoms were identified. The medication regimen targeting those symptoms were initiated. He was medicated & discharged on; Sertraline 25 mg for depression, Lamictal 25 mg for mood stabilization, Gabapentin 400 mg for agitation/pain management, Hydroxyzine 25 mg prn for anxiety & Trazodone 50 mg for insomnia. He presented other pre-existing medical problems that required treatment. He was resumed & discharged on all his pertinent home medications for those health issues. He tolerated his treatment regimen without any adverse effects or reactions reported. Andre [name] was enrolled & participated in the group counseling sessions being offered & held on this unit. He learned coping skills..  During the course of his hospitalization, Andre [name]'s improvement was monitored by observation & his daily report of symptom reduction noted. His emotional & mental status were monitored by daily self-inventory reports completed by him & the clinical staff. He was  evaluated by the treatment team for stability & plans for continued recovery after discharge. His motivation was an integral factor in his mood stability. He was offered further treatment option upon discharge to continue psychiatric care/medication management to maintain mood stability. Upon his hospital discharge, Andre [name] was both mentally & medically stable. He denies suicidal/homicidal ideations, auditory/visual/tactile hallucinations, delusional thoughts or paranoia. He left [facility] with all belongings in no distress. Transportation per roommate.  Physical Findings: AIMS: Facial and Oral Movements Muscles of Facial Expression: None, normal Lips and Perioral Area: None, normal Jaw: None, normal Tongue: None, normal,Extremity Movements Upper (arms, wrists, hands, fingers): None, normal Lower (legs, knees, ankles, toes): None, normal, Trunk Movements Neck, shoulders, hips: None, normal, Overall Severity Severity of abnormal movements (highest score from questions above): None, normal Incapacitation due to abnormal movements: None, normal Patient's awareness of abnormal movements (rate only patient's report): No Awareness, Dental Status Current problems with teeth and/or dentures?: Yes Does patient usually wear dentures?: No  CIWA:    COWS:     Musculoskeletal: Strength & Muscle Tone: within normal limits Gait & Station: normal Patient leans: N/A  Psychiatric Specialty Exam: Review of Systems  Constitutional: Negative.   Eyes: Negative.  Cardiovascular: Negative.   Gastrointestinal: Negative.   Genitourinary: Negative.   Musculoskeletal: Negative.   Skin: Negative.   Neurological: Negative.   Endo/Heme/Allergies: Negative.   Psychiatric/Behavioral: Positive for depression (Stable). Negative for suicidal ideas, hallucinations, memory loss and substance abuse. The patient has insomnia (Stable). The patient is not nervous/anxious.     Blood pressure 120/85, pulse 85, temperature 97.7  F (36.5 C), temperature source Oral, resp. rate 18, height  (1.549 m), weight 68.493 kg (151 lb).Body mass index is 28.55 kg/(m^2).  See Md's SRA   Have you used any form of tobacco in the last 30 days? (Cigarettes, Smokeless Tobacco, Cigars, and/or Pipes): Yes  Has this patient used any form of tobacco in the last 30 days? (Cigarettes, Smokeless Tobacco, Cigars, and/or Pipes) Yes, Provided with nicotine patch prescription.  Blood Alcohol level:  Lab Results  Component Value Date   ETH <5 05/31/2015    Metabolic Disorder Labs:  Lab Results  Component Value Date   HGBA1C 5.5 10/27/2014   MPG 111 10/27/2014   No results found for: PROLACTIN No results found for: CHOL, TRIG, HDL, CHOLHDL, VLDL, LDLCALC  See Psychiatric Specialty Exam and Suicide Risk Assessment completed by Attending Physician prior to discharge.  Discharge destination:  Home  Is patient on multiple antipsychotic therapies at discharge:  No   Has Patient had three or more failed trials of antipsychotic monotherapy by history:  No  Recommended Plan for Multiple Antipsychotic Therapies: NA    Medication List    STOP taking these medications        dicyclomine 10 MG capsule  Commonly known as:  BENTYL     hydrocortisone 25 MG suppository  Commonly known as:  ANUSOL-HC     LORazepam 0.5 MG tablet  Commonly known as:  ATIVAN     ondansetron 4 MG disintegrating tablet  Commonly known as:  ZOFRAN ODT     oxyCODONE 5 MG immediate release tablet  Commonly known as:  Oxy IR/ROXICODONE     prazosin 1 MG capsule  Commonly known as:  MINIPRESS     predniSONE 10 MG (21) Tbpk tablet  Commonly known as:  STERAPRED UNI-PAK 21 TAB     predniSONE 10 MG tablet  Commonly known as:  DELTASONE     promethazine 25 MG tablet  Commonly known as:  PHENERGAN     ULTRAM 50 MG tablet  Generic drug:  traMADol      TAKE these medications      Indication   acetaminophen 500 MG tablet  Commonly known as:   TYLENOL  Take 1 tablet (500 mg total) by mouth every 6 (six) hours as needed (pain).   Indication:  Pain     azaTHIOprine 50 MG tablet  Commonly known as:  IMURAN  Take 3.5 tablets (175 mg total) by mouth daily. Crohn's disease   Indication:  Crohn's Ds     famotidine 20 MG tablet  Commonly known as:  PEPCID  Take 1 tablet (20 mg total) by mouth 2 (two) times daily. For acid reflux   Indication:  Gastroesophageal Reflux Disease     gabapentin 400 MG capsule  Commonly known as:  NEURONTIN  Take 2 capsules (800 mg total) by mouth 3 (three) times daily. For agitation   Indication:  Agitation     hydrOXYzine 25 MG tablet  Commonly known as:  ATARAX/VISTARIL  Take 1 tablet (25 mg total) by mouth every 6 (six) hours as needed for anxiety.  Indication:  Anxiety     lamoTRIgine 25 MG tablet  Commonly known as:  LAMICTAL  Take 1 tablet (25 mg total) by mouth 2 (two) times daily. For mood stabilization   Indication:  Mood stabilization     loperamide 2 MG capsule  Commonly known as:  IMODIUM  Take 1 capsule (2 mg total) by mouth 4 (four) times daily as needed for diarrhea or loose stools.   Indication:  Diarrhea     nicotine 21 mg/24hr patch  Commonly known as:  NICODERM CQ - dosed in mg/24 hours  Place 1 patch (21 mg total) onto the skin daily. For nicotine addiction   Indication:  Nicotine Addiction     ondansetron 4 MG tablet  Commonly known as:  ZOFRAN  Take 1 tablet (4 mg total) by mouth every 6 (six) hours. For nausea   Indication:  Nausea     oxyCODONE-acetaminophen 5-325 MG tablet  Commonly known as:  PERCOCET/ROXICET  Take 1 tablet by mouth every 8 (eight) hours as needed for moderate pain or severe pain.   Indication:  Pain     sertraline 25 MG tablet  Commonly known as:  ZOLOFT  Take 3 tablets (75 mg total) by mouth daily. For depression   Indication:  Major Depressive Disorder     traZODone 50 MG tablet  Commonly known as:  DESYREL  Take 1 tablet (50 mg  total) by mouth at bedtime as needed for sleep.   Indication:  Trouble Sleeping       Follow-up Information    Follow up with BEHAVIORAL HEALTH INTENSIVE PSYCH On 08/03/2015.   Specialty:  Behavioral Health   Why:  at 8:45am with Jeri Modena to complete your assessment for the Intensive Outpatient Program.   Contact information:   [address] [id number] mc [place] [address] [phone]     Follow-up recommendations: Activity:  As tolerated Diet: As recommended by your primary care doctor. Keep all scheduled follow-up appointments as recommended.   Comments: Take all your medications as prescribed by your mental healthcare provider. Report any adverse effects and or reactions from your medicines to your outpatient provider promptly. Patient is instructed and cautioned to not engage in alcohol and or illegal drug use while on prescription medicines. In the event of worsening symptoms, patient is instructed to call the crisis hotline, 911 and or go to the nearest ED for appropriate evaluation and treatment of symptoms. Follow-up with your primary care provider for your other medical issues, concerns and or health care needs.   Signed: Sanjuana Kava, NP, PMHNP-BC 07/30/2015, 9:48 AM  Patient seen, Suicide Assessment Completed.  Disposition Plan Reviewed

## 2015-07-28 NOTE — Progress Notes (Signed)
Recreation Therapy Notes  Animal-Assisted Activity (AAA) Program Checklist/Progress Notes Patient Eligibility Criteria Checklist & Daily Group note for Rec Tx Intervention  Date: 06.06.2017 Time: 2:45pm Location: [address]    AAA/T Program Assumption of Risk Form signed by Patient/ or Parent Legal Guardian Yes  Patient is free of allergies or sever asthma Yes  Patient reports no fear of animals Yes  Patient reports no history of cruelty to animals Yes  Patient understands his/her participation is voluntary Yes  Patient washes hands before animal contact Yes  Patient washes hands after animal contact Yes  Behavioral Response: Engaged, Attentive.   Education: Charity fundraiser, Health visitor   Education Outcome: Acknowledges education.  Clinical Observations/Feedback: Patient interacted appropriately with therapy dog, petting him appropriately and asking appropriate questions about therapy dog and his training. Additionally patient interacted appropriately with peers during session.   Marykay Lex Graves Nipp, LRT/CTRS        Lovely Kerins L 07/28/2015 3:01 PM

## 2015-07-28 NOTE — [facility] Suicide Risk Assessment (Signed)
[facility] Discharge Suicide Risk Assessment   Principal Problem: MDD (major depressive disorder), recurrent severe, without psychosis (HCC) Discharge Diagnoses:  Patient Active Problem List   Diagnosis Date Noted  . Insomnia [G47.00]   . Anxiety state [F41.1]   . MDD (major depressive disorder), recurrent severe, without psychosis (HCC) [F33.2] 07/24/2015  . Diarrhea [R19.7]   . Crohn's disease (HCC) [K50.90] 06/14/2015  . Depression [F32.9] 06/14/2015  . Anxiety [F41.9] 06/14/2015  . Suicidal ideations [R45.851] 05/31/2015  . Exacerbation of Crohn's disease (HCC) [K50.90] 05/27/2015  . Hypokalemia [E87.6] 05/27/2015  . Crohn's disease of both small and large intestine without complication (HCC) [K50.80]   . LLQ abdominal pain [R10.32]   . 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 Chronic pain disorder [G89.4] 05/26/2015  . Chronic back pain greater than 3 months duration [M54.9, G89.29] 11/17/2014  . Tobacco dependence [F17.200] 11/17/2014  . Abdominal pain [R10.9] 11/05/2014  . Metabolic acidosis, increased anion gap (IAG) [E87.2] 11/05/2014  . Crohn's colitis (HCC) [K50.10] 10/26/2014  . Acute hypokalemia [E87.6] 10/26/2014  . Acute hyperglycemia [R73.09] 10/26/2014  . Refractory nausea and vomiting [R11.2] 10/26/2014  . Dehydration, moderate [E86.0] 10/26/2014  . Microcytic anemia [D50.9] 10/26/2014  . Bipolar affective disorder (HCC) [F31.9] 10/26/2014  . Anxiety disorder [F41.9] 10/26/2014  . Acute Crohn's disease (HCC) [K50.90] 10/26/2014    Total Time spent with patient: 30 minutes  Musculoskeletal: Strength & Muscle Tone: within normal limits Gait & Station: normal Patient leans: N/A  Psychiatric Specialty Exam: ROS  Chronic abdominal pain, no melenas   Blood pressure 120/85, pulse 85, temperature 97.7 F (36.5 C), temperature source Oral, resp. rate 18, height 5\' 1"  (1.549 m), weight 151 lb (68.493 kg).Body mass index is 28.55 kg/(m^2).  General Appearance: improved grooming    Eye Contact::  Good  Speech:  Normal Rate409  Volume:  Normal  Mood:  improved mood, less depressed, states today mood improved to 9/10  Affect:  Appropriate and fuller in range   Thought Process:  Linear  Orientation:  Full (Time, Place, and Person)  Thought Content:  denies hallucinations, no delusions   Suicidal Thoughts:  No denies any suicidal ideations, denies any self injurious ideations   Homicidal Thoughts:  No denies any homicidal or violent ideations   Memory:  recent and remote grossly intact m  Judgement:  Other:  improving  Insight:  improved  Psychomotor Activity:  Normal  Concentration:  Good  Recall:  Good  Fund of Knowledge:Good  Language: Good  Akathisia:  Negative  Handed:  Right  AIMS (if indicated):     Assets:  Desire for Improvement Resilience  Sleep:  Number of Hours: 6.75  Cognition: WNL  ADL's:  Intact   Mental Status Per Nursing Assessment::   On Admission:  Suicidal ideation indicated by patient, Suicidal ideation indicated by others  Demographic Factors:  30 year old single male, lives with friend/roommate , on disability   Loss Factors: Disability, chronic medical illness ( Chron's Disease )   Historical Factors: History of depression and history of prior psychiatric admission history of chronic Chron's Disease, has had several prior surgeries   Risk Reduction Factors:   Living with another person, especially a relative and Positive coping skills or problem solving skills  Continued Clinical Symptoms:  At this time patient is improved, mood improved , affect more reactive, no thought disorder, no SI or HI, no psychotic symptoms, future oriented   Cognitive Features That Contribute To Risk:  No gross cognitive deficits  noted upon discharge. Is alert , attentive, and oriented x 3   Suicide Risk:  Mild:  Suicidal ideation of limited frequency, intensity, duration, and specificity.  There are no identifiable plans, no associated intent,  mild dysphoria and related symptoms, good self-control (both objective and subjective assessment), few other risk factors, and identifiable protective factors, including available and accessible social support.  Follow-up Information    Follow up with [facility] On 08/03/2015.   Specialty:  Behavioral Health   Why:  at 8:45am with Jeri Modena to complete your assessment for the Intensive Outpatient Program.   Contact information:   [address] [id number] mc [address] [place] [address] [phone]      Plan Of Care/Follow-up recommendations:  Activity:  as tolerated  Diet:  Regular  Tests:  NA Other:  See below  Patient is leaving unit in good spirits. Plans to follow up as above  Has GI specialist through [facility]  Nehemiah Massed, MD 07/28/2015, 11:18 AM

## 2015-08-04 ENCOUNTER — Ambulatory Visit: Payer: Self-pay | Admitting: Internal Medicine

## 2015-08-11 ENCOUNTER — Encounter: Payer: Self-pay | Admitting: *Deleted

## 2015-08-12 ENCOUNTER — Ambulatory Visit: Payer: Self-pay | Admitting: Gastroenterology

## 2015-08-12 ENCOUNTER — Telehealth: Payer: Self-pay | Admitting: *Deleted

## 2015-08-12 NOTE — Telephone Encounter (Signed)
No show letter mailed to patient. 

## 2015-09-29 ENCOUNTER — Non-Acute Institutional Stay (SKILLED_NURSING_FACILITY): Payer: Medicare Other | Admitting: Internal Medicine

## 2015-09-29 ENCOUNTER — Encounter: Payer: Self-pay | Admitting: Internal Medicine

## 2015-09-29 DIAGNOSIS — G894 Chronic pain syndrome: Secondary | ICD-10-CM | POA: Diagnosis not present

## 2015-09-29 DIAGNOSIS — F411 Generalized anxiety disorder: Secondary | ICD-10-CM

## 2015-09-29 DIAGNOSIS — F332 Major depressive disorder, recurrent severe without psychotic features: Secondary | ICD-10-CM

## 2015-09-29 DIAGNOSIS — K5080 Crohn's disease of both small and large intestine without complications: Secondary | ICD-10-CM | POA: Diagnosis not present

## 2015-09-29 DIAGNOSIS — E878 Other disorders of electrolyte and fluid balance, not elsewhere classified: Secondary | ICD-10-CM

## 2015-09-29 DIAGNOSIS — K219 Gastro-esophageal reflux disease without esophagitis: Secondary | ICD-10-CM

## 2015-09-29 DIAGNOSIS — F314 Bipolar disorder, current episode depressed, severe, without psychotic features: Secondary | ICD-10-CM

## 2015-09-29 DIAGNOSIS — R5381 Other malaise: Secondary | ICD-10-CM

## 2015-09-29 DIAGNOSIS — Z8669 Personal history of other diseases of the nervous system and sense organs: Secondary | ICD-10-CM

## 2015-09-29 NOTE — Progress Notes (Signed)
Patient ID: Andre [name], male   DOB: 1985/08/19, 30 y.o.   MRN: [id number]    HISTORY AND PHYSICAL   DATE: 09/29/15  Location:    [facility] Room Number: 106 A Place of Service: SNF (31)   Extended Emergency Contact Information Primary Emergency Contact: Toomes,Jodie Address: [place] [address] [place] Phone: [phone] Relation: Mother  Advanced Directive information Does patient have an advance directive?: No, Would patient like information on creating an advanced directive?: No - patient declined information FULL CODE Chief Complaint  Patient presents with  . 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 CT head showed no acute process. CXR had no acute changes. K+ 3 on admission; Mg 1.5; Phos 2; nml TFTs; iron 42; Hgb 11.6. He presents to SNF for short term rehab. Of note, he was admitted to behavioral health in June 2017 for SI.  Nursing states he has been refusing lovenox injections. Pt reports  lovenox is irritating abdominal wall which is already TTP from Crohn's. He would like to change norco to q4hrs prn for better pain control. No other concerns. He is a poor historian due to psych d/o. Hx obtained from chart   Past Medical History:  Diagnosis Date  . Anal stricture   . Anemia    Around 2013 required PRBC transfusions. Has received parenteral iron in past. Has taken oral iron in past.  . Anxiety   . Bipolar depression ([place])   . Crohn disease ([place]) 1997  . Crohn's disease [facility])     Past Surgical History:  Procedure Laterality Date  . FLEXIBLE SIGMOIDOSCOPY N/A 10/29/2014   Procedure: FLEXIBLE SIGMOIDOSCOPY;  Surgeon: Manus Gunning, MD;  Location: [facility] ENDOSCOPY;  Service:  Gastroenterology;  Laterality: N/A;  . SUBTOTAL COLECTOMY     Has undergone a total of 3 separate bowel resections including terminal ileal resection and the equivalent of subtotal colectomy. Last surgery was in 2010.    Patient Care Team: No Pcp Per Patient as PCP - General (General Practice) Manus Gunning, MD as Consulting Physician (Gastroenterology)  Social History   Social History  . Marital status: Single    Spouse name: N/A  . Number of children: N/A  . Years of education: N/A   Occupational History  . Not on file.   Social History Main Topics  . Smoking status: Current Every Day Smoker    Packs/day: 1.00    Years: 5.00    Types: Cigarettes  . Smokeless tobacco: Never Used  . Alcohol use No  . Drug use: No  . Sexual activity: Not Currently   Other Topics Concern  . Not on file   Social History Narrative   Disabled   Single   Says he has moved to [place]   06/14/2015        reports that he has been smoking Cigarettes.  He has a 5.00 pack-year smoking history. He has never used smokeless tobacco. He reports that he does not drink alcohol or use drugs.  Family History  Problem Relation Age of Onset  . Hypertension Father    Family Status  Relation Status  . Father  There is no immunization history on file for this patient.  Allergies  Allergen Reactions  . Lithium Other (See Comments)    Toxicity   . Remicade [Infliximab] Anaphylaxis  . Compazine [Prochlorperazine] Other (See Comments)    agitation  . Ketorolac Other (See Comments)     GI Distress  . Morphine Other (See Comments)     GI Distress  . Nsaids Other (See Comments)    Gi distress  . Reglan [Metoclopramide] Other (See Comments)    aggrivation  . Ciprofloxacin Rash  . Haldol [Haloperidol Lactate] Anxiety  . Metronidazole Rash    Medications: Patient's Medications  New Prescriptions   No medications on file  Previous Medications   ACETAMINOPHEN (TYLENOL) 325  MG TABLET    Take 325 mg by mouth every 6 (six) hours as needed for mild pain.   ENOXAPARIN (LOVENOX) 40 MG/0.4ML INJECTION    Inject 40 mg into the skin every morning.   FERROUS GLUCONATE (FERGON) 324 MG TABLET    Take 324 mg by mouth 2 (two) times daily with a meal.   GABAPENTIN (NEURONTIN) 400 MG CAPSULE    Take 2 capsules (800 mg total) by mouth 3 (three) times daily. For agitation   HYDROCODONE-ACETAMINOPHEN (NORCO/VICODIN) 5-325 MG TABLET    Take 1-2 tablets by mouth every 6 (six) hours as needed for moderate pain.   LAMOTRIGINE (LAMICTAL) 25 MG TABLET    Take 1 tablet (25 mg total) by mouth 2 (two) times daily. For mood stabilization   LOPERAMIDE (IMODIUM) 2 MG CAPSULE    Take 1 capsule (2 mg total) by mouth 4 (four) times daily as needed for diarrhea or loose stools.   LORAZEPAM (ATIVAN) 0.5 MG TABLET    Take 0.5 mg by mouth 2 (two) times daily.   MAGNESIUM OXIDE (MAG-OX) 400 MG TABLET    Take 400 mg by mouth 2 (two) times daily.   ONDANSETRON (ZOFRAN) 4 MG TABLET    Take 4 mg by mouth every 8 (eight) hours as needed for nausea or vomiting.   OXAZEPAM (SERAX) 10 MG CAPSULE    Take 10 mg by mouth at bedtime as needed for sleep or anxiety.   POTASSIUM CHLORIDE CRYS    Take 40 mEq by mouth 2 (two) times daily.   RANITIDINE (ZANTAC) 150 MG TABLET    Take 150 mg by mouth 2 (two) times daily.   SERTRALINE (ZOLOFT) 25 MG TABLET    Take 75 mg by mouth at bedtime.  Modified Medications   No medications on file  Discontinued Medications   ACETAMINOPHEN (TYLENOL) 500 MG TABLET    Take 1 tablet (500 mg total) by mouth every 6 (six) hours as needed (pain).   AZATHIOPRINE (IMURAN) 50 MG TABLET    Take 3.5 tablets (175 mg total) by mouth daily. Crohn's disease   FAMOTIDINE (PEPCID) 20 MG TABLET    Take 1 tablet (20 mg total) by mouth 2 (two) times daily. For acid reflux   HYDROXYZINE (ATARAX/VISTARIL) 25 MG TABLET    Take 1 tablet (25 mg total) by mouth every 6 (six) hours as needed for anxiety.    NICOTINE (NICODERM CQ - DOSED IN MG/24 HOURS) 21 MG/24HR PATCH    Place 1 patch (21 mg total) onto the skin daily. For nicotine addiction   ONDANSETRON (ZOFRAN) 4 MG TABLET    Take 1 tablet (4 mg total) by mouth every 6 (six) hours. For nausea   OXYCODONE-ACETAMINOPHEN (PERCOCET/ROXICET) 5-325 MG TABLET  Take 1 tablet by mouth every 8 (eight) hours as needed for moderate pain or severe pain.   SERTRALINE (ZOLOFT) 25 MG TABLET    Take 3 tablets (75 mg total) by mouth daily. For depression   TRAZODONE (DESYREL) 50 MG TABLET    Take 1 tablet (50 mg total) by mouth at bedtime as needed for sleep.    Review of Systems  Unable to perform ROS: Psychiatric disorder    Vitals:   09/29/15 1118  BP: 120/67  Pulse: 70  Resp: 18  Temp: 98.4 F (36.9 C)  TempSrc: Oral  SpO2: 98%  Weight: 140 lb (63.5 kg)  Height: 5' 2"  (1.575 m)   Body mass index is 25.61 kg/m.  Physical Exam  Constitutional: He appears well-developed.  Frail appearing in no NAD, sitting in bed  HENT:  Mouth/Throat: Oropharynx is clear and moist.  MMM  Eyes: Pupils are equal, round, and reactive to light. No scleral icterus.  Neck: Neck supple. Carotid bruit is not present. No thyromegaly present.  Trach dressing intact  Cardiovascular: Regular rhythm, normal heart sounds and intact distal pulses.  Tachycardia present.  Exam reveals no gallop and no friction rub.   No murmur heard. No LE edema b/l. No calf TTP  Pulmonary/Chest: Effort normal and breath sounds normal. He has no wheezes. He has no rales. He exhibits no tenderness.  Abdominal: Soft. Bowel sounds are normal. He exhibits no distension, no abdominal bruit, no pulsatile midline mass and no mass. There is no hepatomegaly. There is tenderness. There is no rebound and no guarding.  Musculoskeletal: He exhibits edema.  Lymphadenopathy:    He has no cervical adenopathy.  Neurological: He is alert.  Strength 4/5 in all extremities  Skin: Skin is warm and dry. No  rash noted.  LLE wound and b/l LE serosanguinous d/c; dsg intact  Psychiatric: He has a normal mood and affect. His behavior is normal. Judgment and thought content normal.     Labs reviewed: Admission on 07/22/2015, Discharged on 07/23/2015  Component Date Value Ref Range Status  . WBC 07/22/2015 8.2  4.0 - 10.5 K/uL Final  . RBC 07/22/2015 3.99* 4.22 - 5.81 MIL/uL Final  . Hemoglobin 07/22/2015 11.0* 13.0 - 17.0 g/dL Final  . HCT 07/22/2015 33.7* 39.0 - 52.0 % Final  . MCV 07/22/2015 84.5  78.0 - 100.0 fL Final  . MCH 07/22/2015 27.6  26.0 - 34.0 pg Final  . MCHC 07/22/2015 32.6  30.0 - 36.0 g/dL Final  . RDW 07/22/2015 14.7  11.5 - 15.5 % Final  . Platelets 07/22/2015 254  150 - 400 K/uL Final  . Neutrophils Relative % 07/22/2015 82  % Final  . Neutro Abs 07/22/2015 6.7  1.7 - 7.7 K/uL Final  . Lymphocytes Relative 07/22/2015 13  % Final  . Lymphs Abs 07/22/2015 1.1  0.7 - 4.0 K/uL Final  . Monocytes Relative 07/22/2015 5  % Final  . Monocytes Absolute 07/22/2015 0.4  0.1 - 1.0 K/uL Final  . Eosinophils Relative 07/22/2015 0  % Final  . Eosinophils Absolute 07/22/2015 0.0  0.0 - 0.7 K/uL Final  . Basophils Relative 07/22/2015 0  % Final  . Basophils Absolute 07/22/2015 0.0  0.0 - 0.1 K/uL Final  . Sodium 07/23/2015 142  135 - 145 mmol/L Final  . Potassium 07/23/2015 3.1* 3.5 - 5.1 mmol/L Final  . Chloride 07/23/2015 106  101 - 111 mmol/L Final  . CO2 07/23/2015 28  22 - 32 mmol/L Final  .  Glucose, Bld 07/23/2015 90  65 - 99 mg/dL Final  . BUN 07/23/2015 7  6 - 20 mg/dL Final  . Creatinine, Ser 07/23/2015 0.77  0.61 - 1.24 mg/dL Final  . Calcium 07/23/2015 8.3* 8.9 - 10.3 mg/dL Final  . GFR calc non Af Amer 07/23/2015 >60  >60 mL/min Final  . GFR calc Af Amer 07/23/2015 >60  >60 mL/min Final   Comment: (NOTE) The eGFR has been calculated using the CKD EPI equation. This calculation has not been validated in all clinical situations. eGFR's persistently <60 mL/min signify  possible Chronic Kidney Disease.   . Anion gap 07/23/2015 8  5 - 15 Final  . Opiates 07/23/2015 POSITIVE* NONE DETECTED Final  . Cocaine 07/23/2015 NONE DETECTED  NONE DETECTED Final  . Benzodiazepines 07/23/2015 POSITIVE* NONE DETECTED Final  . Amphetamines 07/23/2015 NONE DETECTED  NONE DETECTED Final  . Tetrahydrocannabinol 07/23/2015 NONE DETECTED  NONE DETECTED Final  . Barbiturates 07/23/2015 NONE DETECTED  NONE DETECTED Final   Comment:        DRUG SCREEN FOR MEDICAL PURPOSES ONLY.  IF CONFIRMATION IS NEEDED FOR ANY PURPOSE, NOTIFY LAB WITHIN 5 DAYS.        LOWEST DETECTABLE LIMITS FOR URINE DRUG SCREEN Drug Class       Cutoff (ng/mL) Amphetamine      1000 Barbiturate      200 Benzodiazepine   161 Tricyclics       096 Opiates          300 Cocaine          300 THC              50   Admission on 07/21/2015, Discharged on 07/21/2015  Component Date Value Ref Range Status  . Lipase 07/21/2015 28  11 - 51 U/L Final  . Sodium 07/21/2015 144  135 - 145 mmol/L Final  . Potassium 07/21/2015 3.2* 3.5 - 5.1 mmol/L Final  . Chloride 07/21/2015 109  101 - 111 mmol/L Final  . CO2 07/21/2015 23  22 - 32 mmol/L Final  . Glucose, Bld 07/21/2015 99  65 - 99 mg/dL Final  . BUN 07/21/2015 10  6 - 20 mg/dL Final  . Creatinine, Ser 07/21/2015 0.79  0.61 - 1.24 mg/dL Final  . Calcium 07/21/2015 9.0  8.9 - 10.3 mg/dL Final  . Total Protein 07/21/2015 6.9  6.5 - 8.1 g/dL Final  . Albumin 07/21/2015 3.9  3.5 - 5.0 g/dL Final  . AST 07/21/2015 15  15 - 41 U/L Final  . ALT 07/21/2015 13* 17 - 63 U/L Final  . Alkaline Phosphatase 07/21/2015 62  38 - 126 U/L Final  . Total Bilirubin 07/21/2015 0.4  0.3 - 1.2 mg/dL Final  . GFR calc non Af Amer 07/21/2015 >60  >60 mL/min Final  . GFR calc Af Amer 07/21/2015 >60  >60 mL/min Final   Comment: (NOTE) The eGFR has been calculated using the CKD EPI equation. This calculation has not been validated in all clinical situations. eGFR's persistently  <60 mL/min signify possible Chronic Kidney Disease.   . Anion gap 07/21/2015 12  5 - 15 Final  . WBC 07/21/2015 9.8  4.0 - 10.5 K/uL Final  . RBC 07/21/2015 4.29  4.22 - 5.81 MIL/uL Final  . Hemoglobin 07/21/2015 11.9* 13.0 - 17.0 g/dL Final  . HCT 07/21/2015 36.9* 39.0 - 52.0 % Final  . MCV 07/21/2015 86.0  78.0 - 100.0 fL Final  . MCH 07/21/2015 27.7  26.0 -  34.0 pg Final  . MCHC 07/21/2015 32.2  30.0 - 36.0 g/dL Final  . RDW 07/21/2015 15.1  11.5 - 15.5 % Final  . Platelets 07/21/2015 254  150 - 400 K/uL Final  . Color, Urine 07/21/2015 YELLOW  YELLOW Final  . APPearance 07/21/2015 CLEAR  CLEAR Final  . Specific Gravity, Urine 07/21/2015 <1.005* 1.005 - 1.030 Final  . pH 07/21/2015 6.0  5.0 - 8.0 Final  . Glucose, UA 07/21/2015 NEGATIVE  NEGATIVE mg/dL Final  . Hgb urine dipstick 07/21/2015 NEGATIVE  NEGATIVE Final  . Bilirubin Urine 07/21/2015 NEGATIVE  NEGATIVE Final  . Ketones, ur 07/21/2015 NEGATIVE  NEGATIVE mg/dL Final  . Protein, ur 07/21/2015 NEGATIVE  NEGATIVE mg/dL Final  . Nitrite 07/21/2015 NEGATIVE  NEGATIVE Final  . Leukocytes, UA 07/21/2015 NEGATIVE  NEGATIVE Final  . CRP 07/21/2015 1.0* <1.0 mg/dL Final  . Sed Rate 07/21/2015 16  0 - 16 mm/hr Final  Admission on 06/14/2015, Discharged on 06/30/2015  Component Date Value Ref Range Status  . Sodium 06/14/2015 138  135 - 145 mmol/L Final  . Potassium 06/14/2015 4.3  3.5 - 5.1 mmol/L Final   Comment: SLIGHT HEMOLYSIS HEMOLYSIS AT THIS LEVEL MAY AFFECT RESULT   . Chloride 06/14/2015 107  101 - 111 mmol/L Final  . CO2 06/14/2015 16* 22 - 32 mmol/L Final  . Glucose, Bld 06/14/2015 172* 65 - 99 mg/dL Final  . BUN 06/14/2015 9  6 - 20 mg/dL Final  . Creatinine, Ser 06/14/2015 0.98  0.61 - 1.24 mg/dL Final  . Calcium 06/14/2015 9.0  8.9 - 10.3 mg/dL Final  . GFR calc non Af Amer 06/14/2015 >60  >60 mL/min Final  . GFR calc Af Amer 06/14/2015 >60  >60 mL/min Final   Comment: (NOTE) The eGFR has been calculated using  the CKD EPI equation. This calculation has not been validated in all clinical situations. eGFR's persistently <60 mL/min signify possible Chronic Kidney Disease.   . Anion gap 06/14/2015 15  5 - 15 Final  . Magnesium 06/14/2015 1.9  1.7 - 2.4 mg/dL Final  . CRP 06/14/2015 1.4* <1.0 mg/dL Final  . Sed Rate 06/14/2015 5  0 - 16 mm/hr Final  . C Diff antigen 06/14/2015 NEGATIVE  NEGATIVE Final  . C Diff toxin 06/14/2015 NEGATIVE  NEGATIVE Final  . C Diff interpretation 06/14/2015 Negative for toxigenic C. difficile   Final  . Sodium 06/15/2015 141  135 - 145 mmol/L Final  . Potassium 06/15/2015 3.7  3.5 - 5.1 mmol/L Final  . Chloride 06/15/2015 107  101 - 111 mmol/L Final  . CO2 06/15/2015 22  22 - 32 mmol/L Final  . Glucose, Bld 06/15/2015 95  65 - 99 mg/dL Final  . BUN 06/15/2015 <5* 6 - 20 mg/dL Final  . Creatinine, Ser 06/15/2015 0.80  0.61 - 1.24 mg/dL Final  . Calcium 06/15/2015 8.6* 8.9 - 10.3 mg/dL Final  . GFR calc non Af Amer 06/15/2015 >60  >60 mL/min Final  . GFR calc Af Amer 06/15/2015 >60  >60 mL/min Final   Comment: (NOTE) The eGFR has been calculated using the CKD EPI equation. This calculation has not been validated in all clinical situations. eGFR's persistently <60 mL/min signify possible Chronic Kidney Disease.   . Anion gap 06/15/2015 12  5 - 15 Final  . WBC 06/15/2015 7.2  4.0 - 10.5 K/uL Final  . RBC 06/15/2015 3.96* 4.22 - 5.81 MIL/uL Final  . Hemoglobin 06/15/2015 10.8* 13.0 - 17.0 g/dL Final  .  HCT 06/15/2015 34.0* 39.0 - 52.0 % Final  . MCV 06/15/2015 85.9  78.0 - 100.0 fL Final  . MCH 06/15/2015 27.3  26.0 - 34.0 pg Final  . MCHC 06/15/2015 31.8  30.0 - 36.0 g/dL Final  . RDW 06/15/2015 15.7* 11.5 - 15.5 % Final  . Platelets 06/15/2015 196  150 - 400 K/uL Final  . Sodium 06/18/2015 145  135 - 145 mmol/L Final  . Potassium 06/18/2015 3.4* 3.5 - 5.1 mmol/L Final  . Chloride 06/18/2015 113* 101 - 111 mmol/L Final  . CO2 06/18/2015 24  22 - 32 mmol/L  Final  . Glucose, Bld 06/18/2015 90  65 - 99 mg/dL Final  . BUN 06/18/2015 7  6 - 20 mg/dL Final  . Creatinine, Ser 06/18/2015 0.79  0.61 - 1.24 mg/dL Final  . Calcium 06/18/2015 8.6* 8.9 - 10.3 mg/dL Final  . GFR calc non Af Amer 06/18/2015 >60  >60 mL/min Final  . GFR calc Af Amer 06/18/2015 >60  >60 mL/min Final   Comment: (NOTE) The eGFR has been calculated using the CKD EPI equation. This calculation has not been validated in all clinical situations. eGFR's persistently <60 mL/min signify possible Chronic Kidney Disease.   . Anion gap 06/18/2015 8  5 - 15 Final  . WBC 06/18/2015 5.1  4.0 - 10.5 K/uL Final  . RBC 06/18/2015 3.63* 4.22 - 5.81 MIL/uL Final  . Hemoglobin 06/18/2015 9.9* 13.0 - 17.0 g/dL Final  . HCT 06/18/2015 30.9* 39.0 - 52.0 % Final  . MCV 06/18/2015 85.1  78.0 - 100.0 fL Final  . MCH 06/18/2015 27.3  26.0 - 34.0 pg Final  . MCHC 06/18/2015 32.0  30.0 - 36.0 g/dL Final  . RDW 06/18/2015 15.0  11.5 - 15.5 % Final  . Platelets 06/18/2015 149* 150 - 400 K/uL Final  . WBC 06/20/2015 5.7  4.0 - 10.5 K/uL Final  . RBC 06/20/2015 4.31  4.22 - 5.81 MIL/uL Final  . Hemoglobin 06/20/2015 11.7* 13.0 - 17.0 g/dL Final  . HCT 06/20/2015 37.3* 39.0 - 52.0 % Final  . MCV 06/20/2015 86.5  78.0 - 100.0 fL Final  . MCH 06/20/2015 27.1  26.0 - 34.0 pg Final  . MCHC 06/20/2015 31.4  30.0 - 36.0 g/dL Final  . RDW 06/20/2015 15.3  11.5 - 15.5 % Final  . Platelets 06/20/2015 214  150 - 400 K/uL Final  . Neutrophils Relative % 06/20/2015 89  % Final  . Neutro Abs 06/20/2015 5.0  1.7 - 7.7 K/uL Final  . Lymphocytes Relative 06/20/2015 7  % Final  . Lymphs Abs 06/20/2015 0.4* 0.7 - 4.0 K/uL Final  . Monocytes Relative 06/20/2015 4  % Final  . Monocytes Absolute 06/20/2015 0.2  0.1 - 1.0 K/uL Final  . Eosinophils Relative 06/20/2015 0  % Final  . Eosinophils Absolute 06/20/2015 0.0  0.0 - 0.7 K/uL Final  . Basophils Relative 06/20/2015 0  % Final  . Basophils Absolute 06/20/2015  0.0  0.0 - 0.1 K/uL Final  . Sodium 06/20/2015 143  135 - 145 mmol/L Final  . Potassium 06/20/2015 3.9  3.5 - 5.1 mmol/L Final  . Chloride 06/20/2015 108  101 - 111 mmol/L Final  . CO2 06/20/2015 27  22 - 32 mmol/L Final  . Glucose, Bld 06/20/2015 100* 65 - 99 mg/dL Final  . BUN 06/20/2015 <5* 6 - 20 mg/dL Final  . Creatinine, Ser 06/20/2015 0.86  0.61 - 1.24 mg/dL Final  . Calcium 06/20/2015 8.6*  8.9 - 10.3 mg/dL Final  . Total Protein 06/20/2015 5.9* 6.5 - 8.1 g/dL Final  . Albumin 06/20/2015 3.3* 3.5 - 5.0 g/dL Final  . AST 06/20/2015 19  15 - 41 U/L Final  . ALT 06/20/2015 12* 17 - 63 U/L Final  . Alkaline Phosphatase 06/20/2015 66  38 - 126 U/L Final  . Total Bilirubin 06/20/2015 1.0  0.3 - 1.2 mg/dL Final  . GFR calc non Af Amer 06/20/2015 >60  >60 mL/min Final  . GFR calc Af Amer 06/20/2015 >60  >60 mL/min Final   Comment: (NOTE) The eGFR has been calculated using the CKD EPI equation. This calculation has not been validated in all clinical situations. eGFR's persistently <60 mL/min signify possible Chronic Kidney Disease.   . Anion gap 06/20/2015 8  5 - 15 Final  . Lactic Acid, Venous 06/20/2015 1.7  0.5 - 2.0 mmol/L Final  . Lactic Acid, Venous 06/20/2015 1.9  0.5 - 2.0 mmol/L Final  . Fecal Occult Bld 06/25/2015 POSITIVE* NEGATIVE Final  . WBC 06/26/2015 8.8  4.0 - 10.5 K/uL Final  . RBC 06/26/2015 4.22  4.22 - 5.81 MIL/uL Final  . Hemoglobin 06/26/2015 11.2* 13.0 - 17.0 g/dL Final  . HCT 06/26/2015 36.4* 39.0 - 52.0 % Final  . MCV 06/26/2015 86.3  78.0 - 100.0 fL Final  . MCH 06/26/2015 26.5  26.0 - 34.0 pg Final  . MCHC 06/26/2015 30.8  30.0 - 36.0 g/dL Final  . RDW 06/26/2015 15.4  11.5 - 15.5 % Final  . Platelets 06/26/2015 213  150 - 400 K/uL Final  . Sodium 06/26/2015 145  135 - 145 mmol/L Final  . Potassium 06/26/2015 3.2* 3.5 - 5.1 mmol/L Final  . Chloride 06/26/2015 107  101 - 111 mmol/L Final  . CO2 06/26/2015 24  22 - 32 mmol/L Final  . Glucose, Bld  06/26/2015 103* 65 - 99 mg/dL Final  . BUN 06/26/2015 8  6 - 20 mg/dL Final  . Creatinine, Ser 06/26/2015 0.70  0.61 - 1.24 mg/dL Final  . Calcium 06/26/2015 8.8* 8.9 - 10.3 mg/dL Final  . GFR calc non Af Amer 06/26/2015 >60  >60 mL/min Final  . GFR calc Af Amer 06/26/2015 >60  >60 mL/min Final   Comment: (NOTE) The eGFR has been calculated using the CKD EPI equation. This calculation has not been validated in all clinical situations. eGFR's persistently <60 mL/min signify possible Chronic Kidney Disease.   . Anion gap 06/26/2015 14  5 - 15 Final  . WBC 06/28/2015 9.6  4.0 - 10.5 K/uL Final  . RBC 06/28/2015 4.21* 4.22 - 5.81 MIL/uL Final  . Hemoglobin 06/28/2015 11.3* 13.0 - 17.0 g/dL Final  . HCT 06/28/2015 36.8* 39.0 - 52.0 % Final  . MCV 06/28/2015 87.4  78.0 - 100.0 fL Final  . MCH 06/28/2015 26.8  26.0 - 34.0 pg Final  . MCHC 06/28/2015 30.7  30.0 - 36.0 g/dL Final  . RDW 06/28/2015 15.5  11.5 - 15.5 % Final  . Platelets 06/28/2015 235  150 - 400 K/uL Final  . Sodium 06/28/2015 142  135 - 145 mmol/L Final  . Potassium 06/28/2015 3.1* 3.5 - 5.1 mmol/L Final  . Chloride 06/28/2015 106  101 - 111 mmol/L Final  . CO2 06/28/2015 26  22 - 32 mmol/L Final  . Glucose, Bld 06/28/2015 118* 65 - 99 mg/dL Final  . BUN 06/28/2015 8  6 - 20 mg/dL Final  . Creatinine, Ser 06/28/2015 0.77  0.61 -  1.24 mg/dL Final  . Calcium 06/28/2015 8.9  8.9 - 10.3 mg/dL Final  . GFR calc non Af Amer 06/28/2015 >60  >60 mL/min Final  . GFR calc Af Amer 06/28/2015 >60  >60 mL/min Final   Comment: (NOTE) The eGFR has been calculated using the CKD EPI equation. This calculation has not been validated in all clinical situations. eGFR's persistently <60 mL/min signify possible Chronic Kidney Disease.   . Anion gap 06/28/2015 10  5 - 15 Final  . Sodium 06/30/2015 139  135 - 145 mmol/L Final  . Potassium 06/30/2015 3.8  3.5 - 5.1 mmol/L Final  . Chloride 06/30/2015 103  101 - 111 mmol/L Final  . CO2  06/30/2015 23  22 - 32 mmol/L Final  . Glucose, Bld 06/30/2015 91  65 - 99 mg/dL Final  . BUN 06/30/2015 16  6 - 20 mg/dL Final  . Creatinine, Ser 06/30/2015 0.79  0.61 - 1.24 mg/dL Final  . Calcium 06/30/2015 9.0  8.9 - 10.3 mg/dL Final  . GFR calc non Af Amer 06/30/2015 >60  >60 mL/min Final  . GFR calc Af Amer 06/30/2015 >60  >60 mL/min Final   Comment: (NOTE) The eGFR has been calculated using the CKD EPI equation. This calculation has not been validated in all clinical situations. eGFR's persistently <60 mL/min signify possible Chronic Kidney Disease.   . Anion gap 06/30/2015 13  5 - 15 Final    No results found.   Assessment/Plan   ICD-9-CM ICD-10-CM   1. Electrolyte disturbance 276.9 E87.8   2. Crohn's disease of both small and large intestine without complication ([place]) 707.8 K50.80   3. MDD (major depressive disorder), recurrent severe, without psychosis ([place]) 296.33 F33.2   4. Chronic pain disorder 338.4 G89.4   5. Bipolar disorder, current episode depressed, severe, without psychotic features ([place]) 296.53 F31.4   6. Anxiety state 300.00 F41.1   7. Gastroesophageal reflux disease without esophagitis 530.81 K21.9   8. History of seizure disorder V12.49 Z86.69   9. Physical deconditioning 799.3 R53.81     Change lovenox --> heparin 5000 units subcut q12hr  Change norco 7.5/325 q4hrs prn mod-sev pain  Cont other meds as ordered  Check cmp, Mg, Phos  Wound care as ordered  PT/OT/ST as ordered  GOAL: short term rehab and d/c home when medically appropriate. Communicated with pt and nursing.  Will follow  Steven Veazie S. Perlie Gold  [facility] [address] [place], [place] [address] [phone] Cell (Monday-Friday 8 AM - 5 PM) [phone] After 5 PM and follow prompts

## 2015-09-30 LAB — CBC AND DIFFERENTIAL
HEMATOCRIT: 36 % — AB (ref 41–53)
HEMOGLOBIN: 11.8 g/dL — AB (ref 13.5–17.5)
NEUTROS ABS: 3672 /uL
PLATELETS: 266 10*3/uL (ref 150–399)
WBC: 5.4 10*3/mL

## 2015-09-30 LAB — BASIC METABOLIC PANEL
BUN: 1 mg/dL — AB (ref 4–21)
Creatinine: 0.7 mg/dL (ref 0.6–1.3)
GLUCOSE: 78 mg/dL
Potassium: 4.3 mmol/L (ref 3.4–5.3)
Sodium: 142 mmol/L (ref 137–147)

## 2015-09-30 LAB — HEPATIC FUNCTION PANEL
ALT: 7 U/L — AB (ref 10–40)
AST: 11 U/L — AB (ref 14–40)
Alkaline Phosphatase: 92 U/L (ref 25–125)

## 2015-10-05 ENCOUNTER — Non-Acute Institutional Stay (SKILLED_NURSING_FACILITY): Payer: Medicare Other | Admitting: Adult Health

## 2015-10-05 ENCOUNTER — Encounter: Payer: Self-pay | Admitting: Adult Health

## 2015-10-05 DIAGNOSIS — K50119 Crohn's disease of large intestine with unspecified complications: Secondary | ICD-10-CM

## 2015-10-05 DIAGNOSIS — F314 Bipolar disorder, current episode depressed, severe, without psychotic features: Secondary | ICD-10-CM | POA: Diagnosis not present

## 2015-10-05 DIAGNOSIS — R112 Nausea with vomiting, unspecified: Secondary | ICD-10-CM

## 2015-10-05 NOTE — Progress Notes (Signed)
Patient ID: Andre CruzHarry [name], male   DOB: 04/03/1985, 30 y.o.   MRN: [id number]   Location:   [facility] Room Number: 106-A Place of Service:  SNF (31)   CODE STATUS: Full Code  Allergies  Allergen Reactions  . Lithium Other (See Comments)    Toxicity   . Remicade [Infliximab] Anaphylaxis  . Compazine [Prochlorperazine] Other (See Comments)    agitation  . Ketorolac Other (See Comments)     GI Distress  . Morphine Other (See Comments)     GI Distress  . Nsaids Other (See Comments)    Gi distress  . Reglan [Metoclopramide] Other (See Comments)    aggrivation  . Ciprofloxacin Rash  . Haldol [Haloperidol Lactate] Anxiety  . Metronidazole Rash    Chief Complaint  Patient presents with  . Discharge Note    Discharge from facility    HPI:  He is being discharged to home. He will need home health for rn. He will not need any dme. He will need his prescriptions written and will need to follow up with his medical provider.    Past Medical History:  Diagnosis Date  . Anal stricture   . Anemia    Around 2013 required PRBC transfusions. Has received parenteral iron in past. Has taken oral iron in past.  . Anxiety   . Bipolar depression (HCC)   . Crohn disease (HCC) 1997  . Crohn's disease [facility](HCC)     Past Surgical History:  Procedure Laterality Date  . FLEXIBLE SIGMOIDOSCOPY N/A 10/29/2014   Procedure: FLEXIBLE SIGMOIDOSCOPY;  Surgeon: Ruffin FrederickSteven Paul Armbruster, MD;  [facility];  Service: Gastroenterology;  Laterality: N/A;  . SUBTOTAL COLECTOMY     Has undergone a total of 3 separate bowel resections including terminal ileal resection and the equivalent of subtotal colectomy. Last surgery was in 2010.    Social History   Social History  . Marital status: Single    Spouse name: N/A  . Number of children: N/A  . Years of education: N/A   Occupational History  . Not on file.   Social History Main Topics  . Smoking status: Current Every Day  Smoker    Packs/day: 1.00    Years: 5.00    Types: Cigarettes  . Smokeless tobacco: Never Used  . Alcohol use No  . Drug use: No  . Sexual activity: Not Currently   Other Topics Concern  . Not on file   Social History Narrative   Disabled   Single   Says he has moved to [place]   06/14/2015      Family History  Problem Relation Age of Onset  . Hypertension Father       VITAL SIGNS BP 116/62   Pulse 70   Temp 98 F (36.7 C) (Oral)   Resp 18   Ht 5\' 2"  (1.575 m)   Wt 134 lb 2 oz (60.8 kg)   SpO2 98%   BMI 24.53 kg/m   Patient's Medications  New Prescriptions   No medications on file  Previous Medications   ACETAMINOPHEN (TYLENOL) 325 MG TABLET    Take 325 mg by mouth every 6 (six) hours as needed for mild pain.   FERROUS GLUCONATE (FERGON) 324 MG TABLET    Take 324 mg by mouth 2 (two) times daily with a meal.   GABAPENTIN (NEURONTIN) 400 MG CAPSULE    Take 2 capsules (800 mg total) by mouth 3 (three) times daily. For agitation  HYDROCODONE-ACETAMINOPHEN (NORCO) 7.5-325 MG TABLET    Take 1 tablet by mouth every 4 (four) hours as needed for moderate pain.   LAMOTRIGINE (LAMICTAL) 25 MG TABLET    Take 1 tablet (25 mg total) by mouth 2 (two) times daily. For mood stabilization   LOPERAMIDE (IMODIUM) 2 MG CAPSULE    Take 1 capsule (2 mg total) by mouth 4 (four) times daily as needed for diarrhea or loose stools.   LORAZEPAM (ATIVAN) 0.5 MG TABLET    Take 0.5 mg by mouth 2 (two) times daily.   MAGNESIUM OXIDE (MAG-OX) 400 MG TABLET    Take 400 mg by mouth 2 (two) times daily.   ONDANSETRON (ZOFRAN) 4 MG TABLET    Take 4 mg by mouth every 8 (eight) hours as needed for nausea or vomiting.   OXAZEPAM (SERAX) 10 MG CAPSULE    Take 10 mg by mouth at bedtime as needed for sleep or anxiety.   POTASSIUM CHLORIDE CRYS    Take 40 mEq by mouth 2 (two) times daily.   POTASSIUM PHOSPHATE, MONOBASIC, (K-PHOS ORIGINAL) 500 MG TABLET    Take 500 mg by mouth 2 (two) times daily with a  meal.    RANITIDINE (ZANTAC) 150 MG TABLET    Take 150 mg by mouth 2 (two) times daily.   SERTRALINE (ZOLOFT) 25 MG TABLET    Take 75 mg by mouth at bedtime.  Modified Medications   No medications on file  Discontinued Medications   ENOXAPARIN (LOVENOX) 40 MG/0.4ML INJECTION    Inject 40 mg into the skin every morning.   HYDROCODONE-ACETAMINOPHEN (NORCO/VICODIN) 5-325 MG TABLET    Take 1-2 tablets by mouth every 6 (six) hours as needed for moderate pain.     SIGNIFICANT DIAGNOSTIC EXAMS  Review of Systems  Constitutional: Negative for malaise/fatigue.  Respiratory: Negative for cough and shortness of breath.   Cardiovascular: Negative for chest pain, palpitations and leg swelling.  Gastrointestinal: Negative for abdominal pain, constipation and heartburn.  Musculoskeletal: Negative for back pain, joint pain and myalgias.  Skin: Negative.   Neurological: Negative for dizziness.  Psychiatric/Behavioral: The patient is not nervous/anxious.     Physical Exam  Constitutional: He is oriented to person, place, and time. No distress.  Eyes: Conjunctivae are normal.  Neck: Neck supple. No JVD present. No thyromegaly present.  Cardiovascular: Normal rate, regular rhythm and intact distal pulses.   Respiratory: Effort normal and breath sounds normal. No respiratory distress. He has no wheezes.  GI: Soft. Bowel sounds are normal. He exhibits no distension. There is no tenderness.  Musculoskeletal: He exhibits no edema.  Able to move all extremities   Lymphadenopathy:    He has no cervical adenopathy.  Neurological: He is alert and oriented to person, place, and time.  Skin: Skin is warm and dry. He is not diaphoretic.  Psychiatric: He has a normal mood and affect.      ASSESSMENT/ PLAN:  Will discharge to home with home health for rn to evaluate and treat as indicated for medication management. He will not need any dme. The facility is to ensure that he has a follow up appointment  with his medical in 1-2 weeks post discharge. His prescriptions have been written for 30 day supply of his medications with #10 serax 10 mg tabs; #10 ativan 0.5 mg tabs; #15 vicodin 7.5/325 mg tabs   Time spent with patient  40  minutes >50% time spent counseling; reviewing medical record; tests; labs; and developing future  plan of care  Synthia Innocent NP [facility]  Contact [phone] Monday through Friday 8am- 5pm  After hours call [phone]

## 2015-10-15 ENCOUNTER — Telehealth: Payer: Self-pay | Admitting: Gastroenterology

## 2015-10-15 NOTE — Telephone Encounter (Signed)
Called Dr. Kandice Robinsons, patient in [facility] ER. I answered her questions about his case from my prior interactions about him but he has yet to establish care with me, he did not show up for his clinic appointment with me. He can follow up with me as outpatient if he wishes once discharged.

## 2015-10-17 ENCOUNTER — Encounter (HOSPITAL_COMMUNITY): Payer: Self-pay

## 2015-10-17 ENCOUNTER — Emergency Department (HOSPITAL_COMMUNITY)
Admission: EM | Admit: 2015-10-17 | Discharge: 2015-10-17 | Disposition: A | Discharge status: Home / Self Care | Payer: Medicare Other | Attending: Emergency Medicine | Admitting: Emergency Medicine

## 2015-10-17 DIAGNOSIS — K5090 Crohn's disease, unspecified, without complications: Secondary | ICD-10-CM | POA: Insufficient documentation

## 2015-10-17 DIAGNOSIS — Z87891 Personal history of nicotine dependence: Secondary | ICD-10-CM | POA: Diagnosis not present

## 2015-10-17 DIAGNOSIS — I471 Supraventricular tachycardia: Secondary | ICD-10-CM | POA: Insufficient documentation

## 2015-10-17 DIAGNOSIS — R000 Tachycardia, unspecified: Secondary | ICD-10-CM | POA: Diagnosis present

## 2015-10-17 LAB — URINALYSIS, ROUTINE W REFLEX MICROSCOPIC
Glucose, UA: NEGATIVE mg/dL
HGB URINE DIPSTICK: NEGATIVE
Ketones, ur: 15 mg/dL — AB
LEUKOCYTES UA: NEGATIVE
Nitrite: NEGATIVE
PROTEIN: NEGATIVE mg/dL
Specific Gravity, Urine: 1.015 (ref 1.005–1.030)
pH: 6.5 (ref 5.0–8.0)

## 2015-10-17 LAB — COMPREHENSIVE METABOLIC PANEL
ALT: 11 U/L — AB (ref 17–63)
AST: 15 U/L (ref 15–41)
Albumin: 3.1 g/dL — ABNORMAL LOW (ref 3.5–5.0)
Alkaline Phosphatase: 83 U/L (ref 38–126)
Anion gap: 6 (ref 5–15)
BUN: <5 mg/dL — ABNORMAL LOW (ref 6–20)
CHLORIDE: 114 mmol/L — AB (ref 101–111)
CO2: 21 mmol/L — AB (ref 22–32)
CREATININE: 0.82 mg/dL (ref 0.61–1.24)
Calcium: 8.5 mg/dL — ABNORMAL LOW (ref 8.9–10.3)
GFR calc non Af Amer: >60 mL/min (ref >60)
Glucose, Bld: 104 mg/dL — ABNORMAL HIGH (ref 65–99)
POTASSIUM: 3 mmol/L — AB (ref 3.5–5.1)
SODIUM: 141 mmol/L (ref 135–145)
Total Bilirubin: 1.4 mg/dL — ABNORMAL HIGH (ref 0.3–1.2)
Total Protein: 5.7 g/dL — ABNORMAL LOW (ref 6.5–8.1)

## 2015-10-17 LAB — CBC WITH DIFFERENTIAL/PLATELET
BASOS ABS: 0.1 10*3/uL (ref 0.0–0.1)
Basophils Relative: 1 %
EOS ABS: 0.2 10*3/uL (ref 0.0–0.7)
EOS PCT: 2 %
HCT: 37.4 % — ABNORMAL LOW (ref 39.0–52.0)
Hemoglobin: 12.1 g/dL — ABNORMAL LOW (ref 13.0–17.0)
Lymphocytes Relative: 16 %
Lymphs Abs: 1.3 10*3/uL (ref 0.7–4.0)
MCH: 28.5 pg (ref 26.0–34.0)
MCHC: 32.4 g/dL (ref 30.0–36.0)
MCV: 88.2 fL (ref 78.0–100.0)
Monocytes Absolute: 0.6 10*3/uL (ref 0.1–1.0)
Monocytes Relative: 7 %
Neutro Abs: 5.8 10*3/uL (ref 1.7–7.7)
Neutrophils Relative %: 74 %
PLATELETS: 331 10*3/uL (ref 150–400)
RBC: 4.24 MIL/uL (ref 4.22–5.81)
RDW: 13 % (ref 11.5–15.5)
WBC: 7.9 10*3/uL (ref 4.0–10.5)

## 2015-10-17 LAB — LIPASE, BLOOD: Lipase: 32 U/L (ref 11–51)

## 2015-10-17 MED ORDER — ONDANSETRON HCL 4 MG/2ML IJ SOLN
4.0000 mg | Freq: Once | INTRAMUSCULAR | Status: AC
  Administered 2015-10-17: 4 mg via INTRAVENOUS
  Filled 2015-10-17: qty 2

## 2015-10-17 MED ORDER — OXYCODONE-ACETAMINOPHEN 5-325 MG PO TABS: 1.0000 | ORAL_TABLET | ORAL | 0 refills | Status: AC | PRN

## 2015-10-17 MED ORDER — HYDROMORPHONE HCL 1 MG/ML IJ SOLN
INTRAMUSCULAR | Status: AC
  Filled 2015-10-17: qty 1

## 2015-10-17 MED ORDER — HYDROMORPHONE HCL 1 MG/ML IJ SOLN
1.0000 mg | Freq: Once | INTRAMUSCULAR | Status: AC
  Administered 2015-10-17: 1 mg via INTRAVENOUS
  Filled 2015-10-17: qty 1

## 2015-10-17 MED ORDER — SODIUM CHLORIDE 0.9 % IV SOLN
1000.0000 mL | Freq: Once | INTRAVENOUS | Status: AC
  Administered 2015-10-17: 1000 mL via INTRAVENOUS

## 2015-10-17 MED ORDER — SODIUM CHLORIDE 0.9 % IV SOLN
1000.0000 mL | INTRAVENOUS | Status: DC
  Administered 2015-10-17: 1000 mL via INTRAVENOUS

## 2015-10-17 NOTE — ED Provider Notes (Signed)
[facility]-EMERGENCY DEPT Provider Note   CSN: [id number] Arrival date & time: 10/17/15  2033 By signing my name below, I, Levon Hedger, attest that this documentation has been prepared under the direction and in the presence of Arby Barrette, MD . Electronically Signed: Levon Hedger, Scribe. 10/17/2015. 9:33 PM.   History   Chief Complaint Chief Complaint  Patient presents with  . Tachycardia    HPI Andre [name] is a 30 y.o. male with hx of Chron's disease, anemia, and anxiety brought in by ambulance who presents to the Emergency Department complaining of severe, 8/10 LLQ abdominal pain which began five days ago. Pt is complaing of associated nausea, vomiting, diarrhea, and hematochezia. Per pt, he is having 15 bowel movements per day; pt states he normally has 4-5 daily bowel movements. He has 3 past abdominal surgeries, including the equivilent of a subtotal colectomy. He is currently on prednisone for chron's. He was last seen by GI two months ago when he was admitted, but has not since seen GI in outpatient facility. He states he had recently "been in a coma for three weeks".  Pt denies any fever, hematemesis   The history is provided by the patient. No language interpreter was used.   Past Medical History:  Diagnosis Date  . Anal stricture   . Anemia    Around 2013 required PRBC transfusions. Has received parenteral iron in past. Has taken oral iron in past.  . Anxiety   . Bipolar depression (HCC)   . Crohn disease (HCC) 1997  . Crohn's disease [facility])     Patient Active Problem List   Diagnosis Date Noted  . Insomnia   . Anxiety state   . MDD (major depressive disorder), recurrent severe, without psychosis (HCC) 07/24/2015  . Diarrhea   . Crohn's disease (HCC) 06/14/2015  . Depression 06/14/2015  . Anxiety 06/14/2015  . Suicidal ideations 05/31/2015  . Exacerbation of Crohn's disease (HCC) 05/27/2015  . Hypokalemia 05/27/2015  . Crohn's disease of both small and large  intestine without complication (HCC)   . LLQ abdominal pain   . Functional diarrhea   . Chronic pain disorder 05/26/2015  . Chronic back pain greater than 3 months duration 11/17/2014  . Tobacco dependence 11/17/2014  . Abdominal pain 11/05/2014  . Metabolic acidosis, increased anion gap (IAG) 11/05/2014  . Crohn's colitis (HCC) 10/26/2014  . Acute hypokalemia 10/26/2014  . Acute hyperglycemia 10/26/2014  . Refractory nausea and vomiting 10/26/2014  . Dehydration, moderate 10/26/2014  . Microcytic anemia 10/26/2014  . Bipolar affective disorder (HCC) 10/26/2014  . Anxiety disorder 10/26/2014  . Acute Crohn's disease (HCC) 10/26/2014    Past Surgical History:  Procedure Laterality Date  . FLEXIBLE SIGMOIDOSCOPY N/A 10/29/2014   Procedure: FLEXIBLE SIGMOIDOSCOPY;  Surgeon: Ruffin Frederick, MD;  Location: [facility] ENDOSCOPY;  Service: Gastroenterology;  Laterality: N/A;  . SUBTOTAL COLECTOMY     Has undergone a total of 3 separate bowel resections including terminal ileal resection and the equivalent of subtotal colectomy. Last surgery was in 2010.    Home Medications    Prior to Admission medications   Medication Sig Start Date End Date Taking? Authorizing Provider  ferrous sulfate 325 (65 FE) MG tablet Take 325 mg by mouth every morning. 09/20/15 09/19/16 Yes Historical Provider, MD  gabapentin (NEURONTIN) 400 MG capsule Take 2 capsules (800 mg total) by mouth 3 (three) times daily. For agitation 07/28/15  Yes Sanjuana Kava, NP  HYDROcodone-acetaminophen (NORCO) 7.5-325 MG tablet Take 1 tablet by  mouth every 4 (four) hours as needed for moderate pain.   Yes Historical Provider, MD  lamoTRIgine (LAMICTAL) 25 MG tablet Take 1 tablet (25 mg total) by mouth 2 (two) times daily. For mood stabilization 07/28/15  Yes Sanjuana Kava, NP  loperamide (IMODIUM) 2 MG capsule Take 1 capsule (2 mg total) by mouth 4 (four) times daily as needed for diarrhea or loose stools. 07/28/15  Yes Sanjuana Kava, NP    LORazepam (ATIVAN) 0.5 MG tablet Take 0.5 mg by mouth 2 (two) times daily.   Yes Historical Provider, MD  magnesium oxide (MAG-OX) 400 MG tablet Take 400 mg by mouth 2 (two) times daily.   Yes Historical Provider, MD  ondansetron (ZOFRAN) 4 MG tablet Take 4 mg by mouth every 8 (eight) hours as needed for nausea or vomiting.   Yes Historical Provider, MD  potassium chloride SA (K-DUR,KLOR-CON) 20 MEQ tablet Take 40 mEq by mouth 2 (two) times daily.  10/06/15  Yes Historical Provider, MD  sertraline (ZOLOFT) 50 MG tablet Take 50 mg by mouth every morning. 06/30/15  Yes Historical Provider, MD  oxyCODONE-acetaminophen (PERCOCET) 5-325 MG tablet Take 1 tablet by mouth every 4 (four) hours as needed. 10/17/15   Arby Barrette, MD    Family History Family History  Problem Relation Age of Onset  . Hypertension Father     Social History Social History  Substance Use Topics  . Smoking status: Former Smoker    Packs/day: 1.00    Years: 5.00    Types: Cigarettes  . Smokeless tobacco: Never Used  . Alcohol use No     Allergies   Adalimumab; Certolizumab pegol; Lithium; Remicade [infliximab]; Compazine [prochlorperazine]; Ketorolac; Morphine; Nsaids; Prochlorperazine edisylate; Reglan [metoclopramide]; Ciprofloxacin; Haldol [haloperidol lactate]; and Metronidazole   Review of Systems Review of Systems 10 Systems reviewed and are negative for acute change except as noted in the HPI.   Physical Exam Updated Vital Signs BP 106/74 (BP Location: Left Arm)   Pulse 93   Temp 98.4 F (36.9 C) (Oral)   Resp 16   Ht 5\' 2"  (1.575 m)   Wt 140 lb (63.5 kg)   SpO2 100%   BMI 25.61 kg/m   Physical Exam  Constitutional: He appears well-developed and well-nourished.  Nontoxic, alert, NAD  HENT:  Head: Normocephalic and atraumatic.  Mucus membranes are pink and moist  Eyes: Conjunctivae are normal.  Neck: Neck supple.  Cardiovascular: Normal rate, regular rhythm, normal heart sounds and intact  distal pulses.  Exam reveals no gallop and no friction rub.   No murmur heard. Pulmonary/Chest: Effort normal and breath sounds normal. No respiratory distress. He has no wheezes. He has no rales. He exhibits no tenderness.  Abdominal: Soft. There is tenderness. There is guarding.  Moderate LLQ tenderness, guarding Healed lower midline scar with granulation tissue but no draining fistula   Musculoskeletal: He exhibits no edema.  Neurological: He is alert. He has normal reflexes. He displays normal reflexes. He exhibits normal muscle tone. Coordination normal.  DP 2+ and symmetric  Skin: Skin is warm and dry.  Psychiatric: He has a normal mood and affect.  Nursing note and vitals reviewed.   ED Treatments / Results  DIAGNOSTIC STUDIES:  Oxygen Saturation is 100% on RA, normal by my interpretation.    COORDINATION OF CARE:  9:26 PM Will order lipase, CMP, urinalysis, and CBC. Discussed treatment plan with pt at bedside and pt agreed to plan.   Labs (all labs ordered are  listed, but only abnormal results are displayed) Labs Reviewed  CBC WITH DIFFERENTIAL/PLATELET - Abnormal; Notable for the following:       Result Value   Hemoglobin 12.1 (*)    HCT 37.4 (*)    All other components within normal limits  URINALYSIS, ROUTINE W REFLEX MICROSCOPIC (NOT AT [facility]) - Abnormal; Notable for the following:    Bilirubin Urine SMALL (*)    Ketones, ur 15 (*)    All other components within normal limits  COMPREHENSIVE METABOLIC PANEL - Abnormal; Notable for the following:    Potassium 3.0 (*)    Chloride 114 (*)    CO2 21 (*)    Glucose, Bld 104 (*)    BUN <5 (*)    Calcium 8.5 (*)    Total Protein 5.7 (*)    Albumin 3.1 (*)    ALT 11 (*)    Total Bilirubin 1.4 (*)    All other components within normal limits  LIPASE, BLOOD    EKG  EKG Interpretation  Date/Time:  Saturday October 17 2015 20:43:49 EDT Ventricular Rate:  90 PR Interval:    QRS Duration: 88 QT Interval:  377 QTC  Calculation: 462 R Axis:   57 Text Interpretation:  Sinus rhythm Probable left atrial enlargement no change from previous Confirmed by Donnald Garre, MD, Lebron Conners [phone]) on 10/17/2015 10:37:09 PM       Radiology No results found.  Procedures Procedures (including critical care time)  Medications Ordered in ED Medications  0.9 %  sodium chloride infusion (1,000 mLs Intravenous New Bag/Given 10/17/15 2209)    Followed by  0.9 %  sodium chloride infusion (1,000 mLs Intravenous New Bag/Given 10/17/15 2221)  HYDROmorphone (DILAUDID) 1 MG/ML injection (not administered)  HYDROmorphone (DILAUDID) injection 1 mg (1 mg Intravenous Given 10/17/15 2209)  ondansetron (ZOFRAN) injection 4 mg (4 mg Intravenous Given 10/17/15 2209)     Initial Impression / Assessment and Plan / ED Course  I have reviewed the triage vital signs and the nursing notes.  Pertinent labs & imaging results that were available during my care of the patient were reviewed by me and considered in my medical decision making (see chart for details).  Clinical Course     Final Clinical Impressions(s) / ED Diagnoses   Final diagnoses:  SVT (supraventricular tachycardia) (HCC)  Crohn's disease without complication, unspecified gastrointestinal tract location [facility])  Patient had SVT on route with EMS. This spontaneously converted prior to arrival to the emergency department. The patient reported he had not called EMS because of the SVT. He reports he has added just comes and goes sometimes. He called EMS because he was having abdominal pain and vomiting and diarrhea consistent with his Crohn's. She reports 5 days of symptoms. He did report significant vomiting and diarrhea. Diagnostic evaluation does not show dehydration on chemistry panel and patient has no leukocytosis. Clinically the patient does not appear dehydrated. On examination he endorses left lower quadrant pain but does not have guarding or surgical abdomen. At this time, I  feel he is safe for discharge. Patient does have follow-up with gastroenterology but has not yet been seen. He reports he has an upcoming appointment with Dr. Adela Lank a consideration is given 6 Percocet for pain control for Crohn's disease and advised to contact his gastroenterologist and family doctor this week. He is advised that he also has history of chronic pain documented and he must have recurrent pain treated through an outpatient provider to manage chronic pain.  New Prescriptions New Prescriptions   OXYCODONE-ACETAMINOPHEN (PERCOCET) 5-325 MG TABLET    Take 1 tablet by mouth every 4 (four) hours as needed.      Arby BarretteMarcy Luanne Krzyzanowski, MD 10/17/15 2253

## 2015-10-17 NOTE — ED Notes (Signed)
Vial of dilaudid malfunction and had to waste first vial pulled- Lucky Rathke, RN witnessed waste and override pull of second vial of dilaudid

## 2015-10-17 NOTE — ED Triage Notes (Addendum)
Pt called EMS from home with complaints of "not being able to keep anything down" x 4 days. When ems arrived pt hr was 237 in svt. Pt was not symptomatic other than mild sob spo2 98% ra. Pt reports hx of svt with an ablation last month. Pt in NAD at this time with hr of 93 NSR. EMS report pt was able to convert back to sinus with valsalva maneuver.   Pt revieved 4mg  zofran iv and 2mg  morphine iv pta for llq pain. 8/10

## 2018-03-30 IMAGING — CR DG ABDOMEN ACUTE W/ 1V CHEST
3 series · 3 of 3 positions shown · non-contrast
Comparison: Abdominal radiograph 05/25/2015

CLINICAL DATA: Patient with history of Crohn's disease. Unable to
keep food down. Left-sided abdominal pain with nausea vomiting and
diarrhea.

EXAM:
DG ABDOMEN ACUTE W/ 1V CHEST

[chest pa]
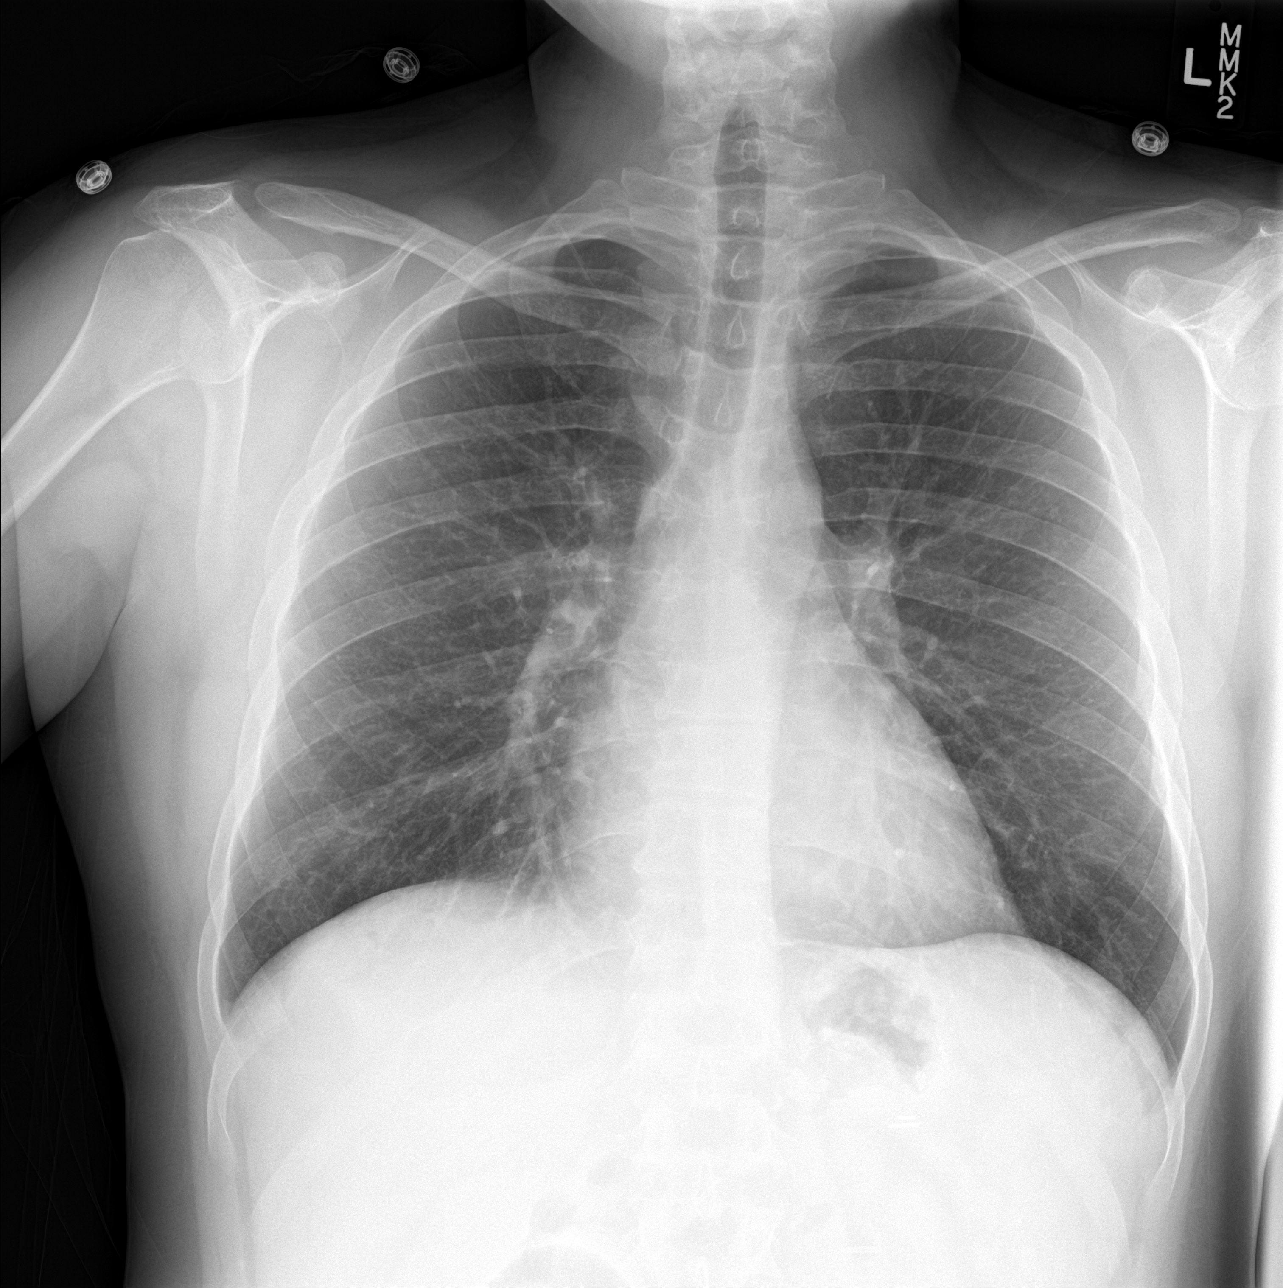

[abdomen erect]
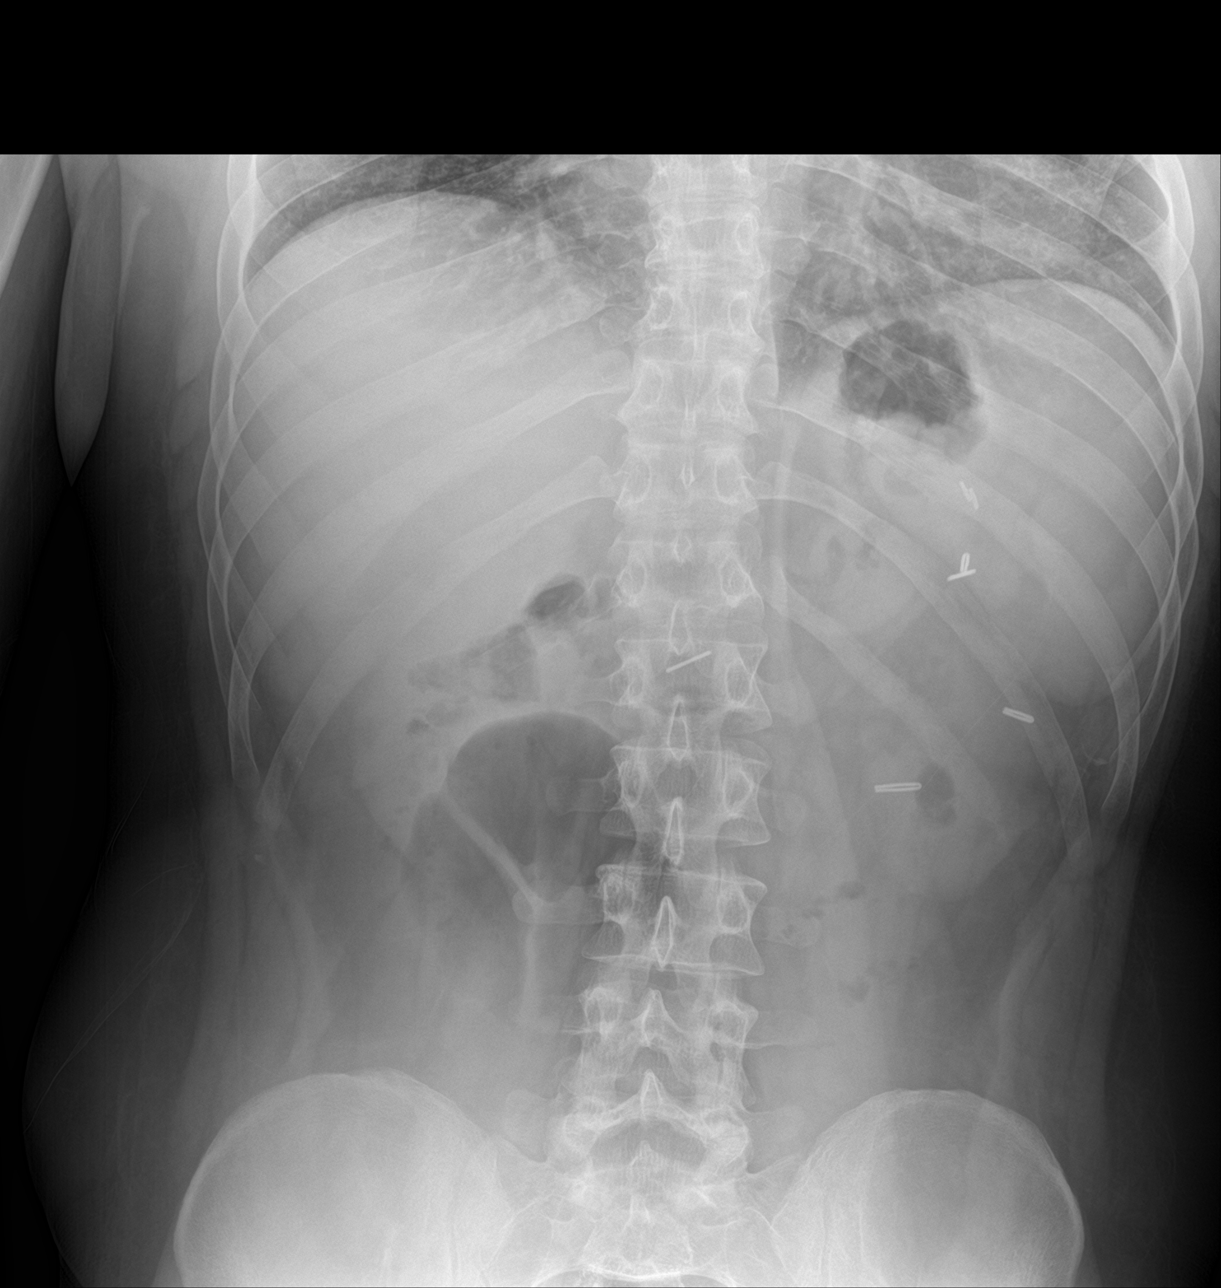

[abdomen supine]
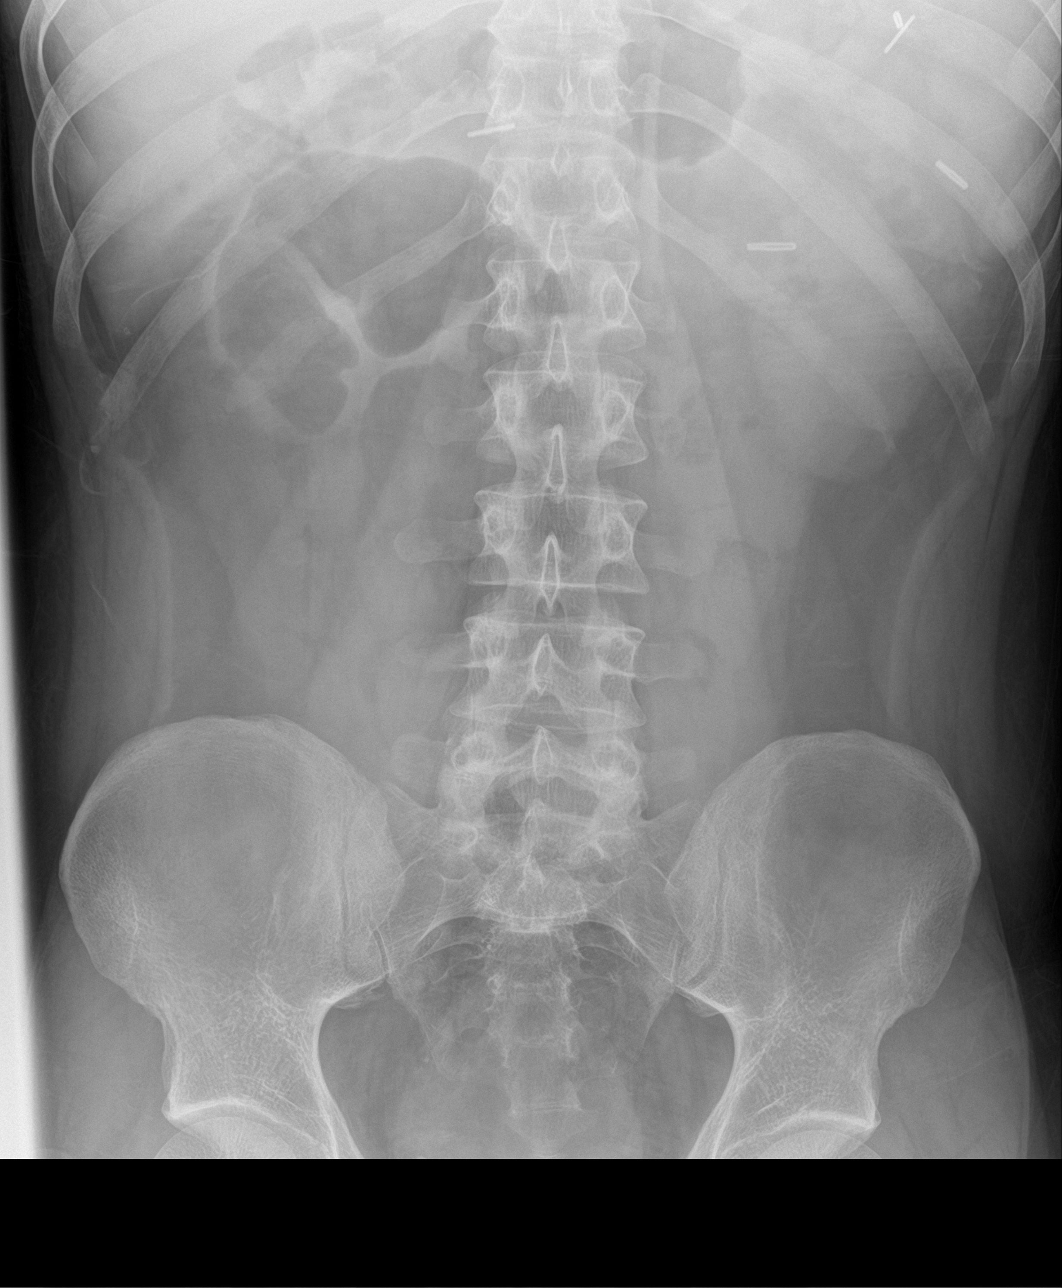

[3 of 3 positions shown; findings below may reference images not displayed]

FINDINGS: Normal cardiac and mediastinal contours. No consolidative pulmonary
opacities. No pleural effusion or pneumothorax. Surgical clips left
upper quadrant. Unchanged nonspecific prominent loop of bowel within
the right hemi abdomen. No free intraperitoneal air.
IMPRESSION: Unchanged gaseous distended loop of featureless bowel within the
right hemi abdomen, nonspecific in etiology.

No acute cardiopulmonary process.

## 2018-04-06 DIAGNOSIS — K9409 Other complications of colostomy: Secondary | ICD-10-CM

## 2018-04-06 NOTE — ED Provider Notes (Signed)
[facility]  Emergency Department Encounter  Emergency Medicine Resident     Pt Name: Mason Thompson  MRN: [id number]  Birthdate 04/22/1985  Date of evaluation: 04/06/18  PCP:  No primary care provider on file.    CHIEF COMPLAINT       Chief Complaint   Patient presents with   ??? Other     problems with ostomy       HISTORY OFPRESENT ILLNESS  (Location/Symptom, Timing/Onset, Context/Setting, Quality, Duration, Modifying Factors,Severity.)      Mason Thompson is a @AGE @ male who presents with a leaking ileostomy  bag.  Patient states that they had a new ostomy placed 2 half weeks ago in [place] for rectal bleeding requiring for blood transfusions.  Patient has a history of Crohn's colitis.  Patient states that since then he is gone through 5 different bag changes, there is irritation and bleeding around the ostomy site, has a burning sensation in his right lower quadrant which he describes as superficial and feels as though is related to the acidity of the stool from his ileum.    PAST MEDICAL / SURGICAL / SOCIAL / FAMILY HISTORY      has no past medical history on file.     has no past surgical history on file.     Social History     Socioeconomic History   ??? Marital status: Single     Spouse name: Not on file   ??? Number of children: Not on file   ??? Years of education: Not on file   ??? Highest education level: Not on file   Occupational History   ??? Not on file   Social Needs   ??? Financial resource strain: Not on file   ??? Food insecurity:     Worry: Not on file     Inability: Not on file   ??? Transportation needs:     Medical: Not on file     Non-medical: Not on file   Tobacco Use   ??? Smoking status: Not on file   Substance and Sexual Activity   ??? Alcohol use: Not on file   ??? Drug use: Not on file   ??? Sexual activity: Not on file   Lifestyle   ??? Physical activity:     Days per week: Not on file     Minutes per session: Not on file   ??? Stress: Not on file   Relationships   ??? Social connections:      Talks on phone: Not on file     Gets together: Not on file     Attends religious service: Not on file     Active member of club or organization: Not on file     Attends meetings of clubs or organizations: Not on file     Relationship status: Not on file   ??? Intimate partner violence:     Fear of current or ex partner: Not on file     Emotionally abused: Not on file     Physically abused: Not on file     Forced sexual activity: Not on file   Other Topics Concern   ??? Not on file   Social History Narrative   ??? Not on file       No family history on file.     Allergies:  Patient has no known allergies.    Home Medications:  Prior to Admission medications    Not on File  REVIEW OFSYSTEMS    (2-9 systems for level 4, 10 or more for level 5)      Review of Systems   Constitutional: Negative for chills, diaphoresis, fatigue and fever.   HENT: Negative for rhinorrhea, sore throat, tinnitus and trouble swallowing.    Eyes: Negative for visual disturbance.   Respiratory: Negative for cough, chest tightness, shortness of breath and wheezing.    Cardiovascular: Negative for chest pain and leg swelling.   Gastrointestinal: Negative for abdominal distention, abdominal pain, constipation, diarrhea, nausea and vomiting.        Endorses leakage from ostomy bag   Endocrine: Negative for polyuria.   Genitourinary: Negative for dysuria, flank pain and frequency.   Musculoskeletal: Negative for arthralgias, back pain, joint swelling and myalgias.   Neurological: Negative for dizziness, tremors, seizures, weakness, light-headedness, numbness and headaches.       PHYSICAL EXAM   (up to 7 for level 4, 8 or more forlevel 5)      INITIAL VITALS:   ED Triage Vitals   BP Temp Temp src Pulse Resp SpO2 Height Weight   04/06/18 2123 -- -- 04/06/18 2123 04/06/18 2128 04/06/18 2123 04/06/18 2123 04/06/18 2123   122/62   94 16 99 % 5\' 2"  (1.575 m) 125 lb (56.7 kg)       Physical Exam  Constitutional:       General: He is not in acute  distress.     Appearance: He is well-developed.   HENT:      Head: Normocephalic and atraumatic.      Right Ear: External ear normal.      Left Ear: External ear normal.   Eyes:      General: No scleral icterus.        Right eye: No discharge.         Left eye: No discharge.      Conjunctiva/sclera: Conjunctivae normal.      Pupils: Pupils are equal, round, and reactive to light.   Neck:      Musculoskeletal: Normal range of motion.      Vascular: No JVD.      Trachea: No tracheal deviation.   Cardiovascular:      Rate and Rhythm: Regular rhythm.      Heart sounds: Normal heart sounds.   Pulmonary:      Effort: Pulmonary effort is normal. No respiratory distress.      Breath sounds: Normal breath sounds. No stridor. No wheezing.   Abdominal:      General: Bowel sounds are normal. There is no distension.      Palpations: Abdomen is soft.      Tenderness: There is no abdominal tenderness. There is no guarding or rebound.      Comments: Patient had gross stool output from ileostomy bag leaking onto his abdomen causing irritation and bleeding to the skin surrounding it   Musculoskeletal: Normal range of motion.         General: No tenderness or deformity.   Skin:     General: Skin is warm.      Coloration: Skin is not pale.   Neurological:      Mental Status: He is alert and oriented to person, place, and time.      Cranial Nerves: No cranial nerve deficit.         DIFFERENTIAL  DIAGNOSIS     PLAN (LABS / IMAGING / EKG):  Orders Placed This Encounter   Procedures   ???  Inpatient consult to General Surgery   ??? PATIENT STATUS (FROM ED OR OR/PROCEDURAL) Observation       MEDICATIONS ORDERED:  Orders Placed This Encounter   Medications   ??? DISCONTD: acetaminophen (TYLENOL) tablet 650 mg   ??? DISCONTD: oxyCODONE (ROXICODONE) immediate release tablet 5 mg       DDX: Ileostomy issue, cellulitis, skin irritation    Initial MDM/Plan: 33 y.o. male who presents with leakage from ostomy bag, patient recently had a emergency colectomy  secondary to Crohn's letter up with rectal bleeding which required 4 units of blood, patient currently on the way up to [facility] to see a Crohn specialist but needs his back replaced before he goes on the bus.  General surgery consulted as ostomy nurse is not here, patient cleared for discharge after ostomy bag replaced and given replacement bag    DIAGNOSTIC RESULTS / EMERGENCYDEPARTMENT COURSE / MDM     LABS:  Labs Reviewed - No data to display      RADIOLOGY:  No results found.      EKG      All EKG's are interpreted by the Emergency Department Physicianwho either signs or Co-signs this chart in the absence of a cardiologist.    EMERGENCY DEPARTMENT COURSE:    See medical decision making      PROCEDURES:  None    CONSULTS:  IP CONSULT TO GENERAL SURGERY    CRITICAL CARE:  Please see attending note    FINAL IMPRESSION      1. Encounter for ostomy care education          DISPOSITION / PLAN     DISPOSITION Decision To Discharge 04/07/2018 04:09:56 AM      PATIENT REFERRED TO:  [facility] ED  [address]  [place] [address]  [phone]    As needed      DISCHARGE MEDICATIONS:  There are no discharge medications for this patient.      Ninetta Lights, MD  Emergency Medicine Resident    (Please note that portions of this note were completed with a voice recognition program.Efforts were made to edit the dictations but occasionally words are mis-transcribed.)       Ninetta Lights, MD  Resident  04/07/18 [phone]

## 2018-04-06 NOTE — ED Provider Notes (Signed)
[place] [facility] ED  Emergency Department  Faculty Sign-Out Addendum     Care of Leeam Sandau was assumed from previous attending and is being seen for Other (problems with ostomy)  .  The patient's initial evaluation and plan have been discussed with the prior provider who initially evaluated the patient.      EMERGENCY DEPARTMENT COURSE / MEDICAL DECISION MAKING:       MEDICATIONS GIVEN:  Orders Placed This Encounter   Medications   ??? acetaminophen (TYLENOL) tablet 650 mg   ??? oxyCODONE (ROXICODONE) immediate release tablet 5 mg       LABS / RADIOLOGY:     Labs Reviewed - No data to display    No results found.    RECENT VITALS:     Temp: 97.8 ??F (36.6 ??C),  Pulse: 75, Resp: 16, BP: 106/65, SpO2: 99 %    This patient is a 33 y.o. Male with ostomy issues.  Patient had recent colostomy 2-1/2 weeks ago and has had issues with the bags not staying in place.  We have attempted to fit him with an appropriate ostomy bag however we have been unsuccessful after multiple times here in the emergency department.  Will bring patient into observation unit with wound ostomy consultation to assist in patient getting appropriate fitting and supplies    OUTSTANDING TASKS / RECOMMENDATIONS:    1. ETU admission      Blain Pais, MD, [facility]  Attending Emergency Physician  [facility] ED       Lanice Schwab, MD  04/07/18 [phone]

## 2018-04-06 NOTE — ED Provider Notes (Signed)
[facility]  Emergency Department  Faculty Attestation     I performed a history and physical examination of the patient and discussed management with the resident. I reviewed the resident???s note and agree with the documented findings and plan of care. Any areas of disagreement are noted on the chart. I was personally present for the key portions of any procedures. I have documented in the chart those procedures where I was not present during the key portions. I have reviewed the emergency nurses triage note. I agree with the chief complaint, past medical history, past surgical history, allergies, medications, social and family history as documented unless otherwise noted below.    For Physician Assistant/ Nurse Practitioner cases/documentation I have personally evaluated this patient and have completed at least one if not all key elements of the E/M (history, physical exam, and MDM). Additional findings are as noted.      Primary Care Physician:  No primary care provider on file.    Screenings:  @RWKSCREENINGS @    CHIEF COMPLAINT       Chief Complaint   Patient presents with   ??? Other     problems with ostomy       RECENT VITALS:   Temp: 97.8 ??F (36.6 ??C),  Pulse: 75, Resp: 16, BP: 106/65    LABS:  Labs Reviewed - No data to display    Radiology  No orders to display         Attending Physician Additional  Notes    Patient is traveling on the road from [place] to his final destination of [facility] and has a layover here.  He has been dealing with ostomy issues where he has inability to for the adhesive to stick and persistent soiling.  Has been through several separate ostomy bags today and is down to 1 left.  He has significant discomfort in the skin.  He has a history of Crohn's disease with multiple prior surgeries.  He was actually in the hospital when he had a sudden hemorrhage requiring colectomy.  He is still on high-dose prednisone and get Strattera for  immunotherapy.  No recent fevers or intra-abdominal pain.  On exam he is mildly uncomfortable but afebrile vital signs are normal.  Plan is ostomy nurse consultation, analgesics, supplies, anticipate discharge.            Kayleen Memos. Orson Aloe, MD, FACEP  Attending Emergency  Physician                Rudene Anda, MD  04/06/18 2325

## 2018-04-06 NOTE — ED Notes (Signed)
Patient states that he is having trouble getting his ostomy bag to stick. The ostomy is 46 weeks old. Stool noted in the ostomy bag.      Loyce Dys, RN  04/06/18 [phone]

## 2018-04-07 ENCOUNTER — Inpatient Hospital Stay
Admit: 2018-04-07 | Discharge: 2018-04-07 | Disposition: A | Discharge status: Home / Self Care | Payer: Medicare Other | Source: Ambulatory Visit | Admitting: Emergency Medicine

## 2018-04-07 MED ORDER — OXYCODONE HCL 5 MG PO TABS
5 MG | ORAL | Status: DC | PRN
Start: 2018-04-07 — End: 2018-04-07
  Administered 2018-04-07: 07:00:00 5 mg via ORAL

## 2018-04-07 MED ORDER — ACETAMINOPHEN 325 MG PO TABS
325 MG | ORAL | Status: DC | PRN
Start: 2018-04-07 — End: 2018-04-07

## 2018-04-07 MED FILL — OXYCODONE HCL 5 MG PO TABS: 5 mg | ORAL | Qty: 1

## 2018-04-07 NOTE — ED Notes (Signed)
Patient sleeping. Patient waiting on general surgery.      Loyce Dys, RN  04/07/18 0040

## 2018-04-07 NOTE — ED Notes (Signed)
Residents in room to attach ostomy bag.      Loyce Dys, RN  04/07/18 [phone]

## 2018-04-07 NOTE — Consults (Signed)
Consult - General Surgery    PATIENT NAME: Mason Thompson  AGE: 33 y.o.  MEDICAL RECORD NO. [id number]  DATE: 04/07/2018  SURGEON: Dr. Jeanella Flattery  PRIMARY CARE PHYSICIAN: No primary care provider on file.    Patient evaluated at the request of  Dr. Benjiman Core  Reason for evaluation: Persistent ostomy bag leakage despite changes, bleeding, ostomy nurse not in house    Patient information was obtained from patient.  History/Exam limitations: none.  Patient presented to the Emergency Department by private vehicle.    IMPRESSION:   1. 33 y.o. male with ileostomy bag not sticking to his skin; status post exploratory laparotomy with ileostomy creation 2 weeks ago in [place] for reported bowel perforation      MEDICAL DECISION MAKING AND PLAN:   1. Ostomy appliance change at bedside  2. Local wound care to midline incision that appears excoriated  3. Advised patient to follow-up with a general surgeon in [place] which is where he lives.  4. Additionally showed the patient several different ways to cut the opening of the ostomy bag that will provide more success with adherence to the skin  5. Okay to discharge from surgery standpoint      HISTORY:   History of Chief Complaint:    Mason Thompson is a 33 y.o. male who presents with leakage around the ostomy bag.  Patient has a history of of multiple abdominal surgeries the last of which was exploratory laparotomy with ileostomy creation for what appears to be forCrohn's disease.  Patient states that he was on his way back home to [place] from [place] which is where he had the surgery when he noticed that the ostomy appliance was not sticking to his abdominal wall.  Patient was on a [employer] bus with a pitstop to [place] decided to come to the emergency department to see if he can get the ostomy appliance to stick better.  Patient states that he has been tolerating a general diet, ostomy has been functioning very well.  Patient additionally states that his ostomy  appliance was working very well during his hospitalization; however, once he was discharged to rehab, the ostomy appliance was changed and that one has not been working as well as the one that he had used during his hospital stay.    Denies any fevers, chills, shortness of breath, chest pain, nausea or vomiting, hematuria, dysuria.         Past Medical History  Crohn's disease, depression    Past Surgical History  Colectomy with ileanal anastomosis  Exploratory laparotomy with ileostomy creation    Medications  Neurontin 800 mg 3 times daily  Lomotil 2.5-0.025 mg 4 times daily as needed  Lamictal 100 mg daily  Abilify 10 mg daily    PRN Meds:.oxyCODONE, acetaminophen  Allergies  No known drug allergies  Family History  No family history of bleeding or clotting disorders  Social History  Denies alcohol, drug, tobacco use    Review of Systems  General Denies any fever or chills  HEENT Denies any diplopia, tinnitus or vertigo  Resp Denies any shortness of breath, cough or wheezing  Cardiac Denies any chest pain, palpitations, claudication or edema  GI Denies any melena, hematochezia, hematemesis or pyrosis  GU Denies any frequency, urgency, hesitancy or incontinence  Heme Denies bruising or bleeding easily  Endocrine Denies any history of diabetes or thyroid disease  Neuro Denies any focal motor or sensory deficits    PHYSICAL:   VITALS:  height is  5\' 2"  (1.575 m) and weight is 125 lb (56.7 kg). His temperature is 97.8 ??F (36.6 ??C). His blood pressure is 106/65 and his pulse is 75. His respiration is 16 and oxygen saturation is 99%.      CONSTITUTIONAL: awake, alert, cooperative, no apparent distress, and appears stated age and Alert and oriented times 3, no acute distress and cooperative to examination.  HEENT: Head is normocephalic, atraumatic. EOMI, PERRLA  NECK: Soft, trachea midline and straight  LUNGS: Chest expands equally bilaterally upon respiration, no accessory muscle used  CARDIOVASCULAR: Regular rate and  rhythm  ABDOMEN: soft, nontender, nondistended; right lower quadrant ileostomy patent with stool in ostomy bag.  Midline abdominal incisional scar healing.  Excoriation on the skin at the midline scar as well as around the ostomy without any active bleeding or signs of infection  NEUROLOGIC: CN II-XII are grossly intact. There are no focalizing motor or sensory deficits  EXTREMITIES: no cyanosis, clubbing or edema    LABS:   No labs to review    RADIOLOGY:   No imaging to review    Thank you for the interesting evaluation. Further recommendations to follow.    Jim Like, DO  04/07/18, 2:00 AM         Attending Note    Patient in transit and required assistance with leaking ostomy bag.  I have reviewed the above TECSS note(s) and I either performed the key elements of the medical history and physical exam or was present with the resident when the key elements of the medical history and physical exam were performed. I have discussed the findings, established the care plan and recommendations with Resident Selena Batten.  Discharged from ED.    Isidor Holts, MD  04/08/2018  12:18 PM

## 2018-04-07 NOTE — ED Notes (Signed)
Skin noted to be very excoriated around stoma site.      Loyce Dys, RN  04/07/18 [phone]

## 2018-04-07 NOTE — Discharge Instructions (Signed)
Please feel free return to the hospital if your symptoms worsen or any new concerning symptoms develop.  Follow-up with your primary care physician as needed for all other the concerns.
